# Patient Record
Sex: Female | Born: 1935 | Race: White | Hispanic: No | Smoking: Never smoker
Health system: Southern US, Community
[De-identification: ages and names within clinical notes are randomized; demographics above are authoritative.]

## PROBLEM LIST (undated history)

## (undated) DIAGNOSIS — R519 Headache, unspecified: Secondary | ICD-10-CM

## (undated) DIAGNOSIS — J189 Pneumonia, unspecified organism: Secondary | ICD-10-CM

## (undated) DIAGNOSIS — I10 Essential (primary) hypertension: Secondary | ICD-10-CM

## (undated) DIAGNOSIS — M1990 Unspecified osteoarthritis, unspecified site: Secondary | ICD-10-CM

## (undated) DIAGNOSIS — F0390 Unspecified dementia without behavioral disturbance: Secondary | ICD-10-CM

---

## 2019-07-03 ENCOUNTER — Inpatient Hospital Stay (HOSPITAL_COMMUNITY)
Admission: AD | Admit: 2019-07-03 | Discharge: 2019-07-17 | DRG: 522 | Disposition: A | Discharge status: Other Acute Inpatient Hospital | Payer: Medicare Other | Source: Other Acute Inpatient Hospital | Attending: Internal Medicine | Admitting: Internal Medicine

## 2019-07-03 ENCOUNTER — Other Ambulatory Visit: Payer: Self-pay

## 2019-07-03 DIAGNOSIS — Z882 Allergy status to sulfonamides status: Secondary | ICD-10-CM | POA: Diagnosis not present

## 2019-07-03 DIAGNOSIS — S72012A Unspecified intracapsular fracture of left femur, initial encounter for closed fracture: Secondary | ICD-10-CM | POA: Diagnosis present

## 2019-07-03 DIAGNOSIS — D649 Anemia, unspecified: Secondary | ICD-10-CM | POA: Diagnosis present

## 2019-07-03 DIAGNOSIS — Z79899 Other long term (current) drug therapy: Secondary | ICD-10-CM | POA: Diagnosis not present

## 2019-07-03 DIAGNOSIS — D62 Acute posthemorrhagic anemia: Secondary | ICD-10-CM | POA: Diagnosis not present

## 2019-07-03 DIAGNOSIS — L89152 Pressure ulcer of sacral region, stage 2: Secondary | ICD-10-CM | POA: Diagnosis present

## 2019-07-03 DIAGNOSIS — M1990 Unspecified osteoarthritis, unspecified site: Secondary | ICD-10-CM | POA: Diagnosis present

## 2019-07-03 DIAGNOSIS — Z20822 Contact with and (suspected) exposure to covid-19: Secondary | ICD-10-CM | POA: Diagnosis present

## 2019-07-03 DIAGNOSIS — D72829 Elevated white blood cell count, unspecified: Secondary | ICD-10-CM | POA: Diagnosis present

## 2019-07-03 DIAGNOSIS — Z880 Allergy status to penicillin: Secondary | ICD-10-CM | POA: Diagnosis not present

## 2019-07-03 DIAGNOSIS — Z96642 Presence of left artificial hip joint: Secondary | ICD-10-CM

## 2019-07-03 DIAGNOSIS — I10 Essential (primary) hypertension: Secondary | ICD-10-CM | POA: Diagnosis present

## 2019-07-03 DIAGNOSIS — E876 Hypokalemia: Secondary | ICD-10-CM | POA: Diagnosis present

## 2019-07-03 DIAGNOSIS — Z833 Family history of diabetes mellitus: Secondary | ICD-10-CM | POA: Diagnosis not present

## 2019-07-03 DIAGNOSIS — Z885 Allergy status to narcotic agent status: Secondary | ICD-10-CM

## 2019-07-03 DIAGNOSIS — S72002A Fracture of unspecified part of neck of left femur, initial encounter for closed fracture: Secondary | ICD-10-CM | POA: Diagnosis present

## 2019-07-03 DIAGNOSIS — S72009A Fracture of unspecified part of neck of unspecified femur, initial encounter for closed fracture: Secondary | ICD-10-CM | POA: Diagnosis present

## 2019-07-03 DIAGNOSIS — L8990 Pressure ulcer of unspecified site, unspecified stage: Secondary | ICD-10-CM | POA: Insufficient documentation

## 2019-07-03 DIAGNOSIS — W010XXA Fall on same level from slipping, tripping and stumbling without subsequent striking against object, initial encounter: Secondary | ICD-10-CM | POA: Diagnosis present

## 2019-07-03 DIAGNOSIS — S72002S Fracture of unspecified part of neck of left femur, sequela: Secondary | ICD-10-CM | POA: Insufficient documentation

## 2019-07-03 HISTORY — DX: Essential (primary) hypertension: I10

## 2019-07-03 MED ORDER — HYDROCODONE-ACETAMINOPHEN 5-325 MG PO TABS
1.0000 | ORAL_TABLET | Freq: Four times a day (QID) | ORAL | Status: DC | PRN
  Administered 2019-07-04: 1 via ORAL
  Administered 2019-07-04 (×2): 2 via ORAL
  Administered 2019-07-05: 1 via ORAL
  Administered 2019-07-05: 2 via ORAL
  Administered 2019-07-06: 1 via ORAL
  Administered 2019-07-06 – 2019-07-12 (×16): 2 via ORAL
  Administered 2019-07-12: 1 via ORAL
  Administered 2019-07-12: 2 via ORAL
  Administered 2019-07-13 (×2): 1 via ORAL
  Administered 2019-07-13: 2 via ORAL
  Administered 2019-07-14 (×2): 1 via ORAL
  Administered 2019-07-14: 2 via ORAL
  Administered 2019-07-15 – 2019-07-16 (×5): 1 via ORAL
  Administered 2019-07-16: 2 via ORAL
  Administered 2019-07-17: 1 via ORAL
  Filled 2019-07-03: qty 2
  Filled 2019-07-03 (×3): qty 1
  Filled 2019-07-03 (×2): qty 2
  Filled 2019-07-03 (×4): qty 1
  Filled 2019-07-03 (×7): qty 2
  Filled 2019-07-03 (×2): qty 1
  Filled 2019-07-03: qty 2
  Filled 2019-07-03: qty 1
  Filled 2019-07-03: qty 2
  Filled 2019-07-03: qty 1
  Filled 2019-07-03: qty 2
  Filled 2019-07-03 (×2): qty 1
  Filled 2019-07-03 (×3): qty 2
  Filled 2019-07-03 (×2): qty 1
  Filled 2019-07-03 (×6): qty 2
  Filled 2019-07-03: qty 1

## 2019-07-03 MED ORDER — MORPHINE SULFATE (PF) 2 MG/ML IV SOLN
0.5000 mg | INTRAVENOUS | Status: DC | PRN
  Administered 2019-07-04 – 2019-07-07 (×7): 0.5 mg via INTRAVENOUS
  Filled 2019-07-03 (×7): qty 1

## 2019-07-03 NOTE — Progress Notes (Signed)
Pt arrived to room @ 2100. Skin assessment completed w/ Vee RN.   MD and admissions paged regarding pt arrival.   CHG bath completed

## 2019-07-04 ENCOUNTER — Encounter (HOSPITAL_COMMUNITY): Payer: Self-pay | Admitting: Internal Medicine

## 2019-07-04 ENCOUNTER — Inpatient Hospital Stay (HOSPITAL_COMMUNITY): Payer: Medicare Other

## 2019-07-04 DIAGNOSIS — S72002A Fracture of unspecified part of neck of left femur, initial encounter for closed fracture: Secondary | ICD-10-CM

## 2019-07-04 DIAGNOSIS — L8990 Pressure ulcer of unspecified site, unspecified stage: Secondary | ICD-10-CM | POA: Insufficient documentation

## 2019-07-04 DIAGNOSIS — I10 Essential (primary) hypertension: Secondary | ICD-10-CM | POA: Diagnosis present

## 2019-07-04 DIAGNOSIS — D649 Anemia, unspecified: Secondary | ICD-10-CM | POA: Diagnosis present

## 2019-07-04 LAB — COMPREHENSIVE METABOLIC PANEL
ALT: 18 U/L (ref 0–44)
AST: 23 U/L (ref 15–41)
Albumin: 2.8 g/dL — ABNORMAL LOW (ref 3.5–5.0)
Alkaline Phosphatase: 85 U/L (ref 38–126)
Anion gap: 10 (ref 5–15)
BUN: 6 mg/dL — ABNORMAL LOW (ref 8–23)
CO2: 27 mmol/L (ref 22–32)
Calcium: 8.6 mg/dL — ABNORMAL LOW (ref 8.9–10.3)
Chloride: 100 mmol/L (ref 98–111)
Creatinine, Ser: 0.61 mg/dL (ref 0.44–1.00)
GFR calc Af Amer: >60 mL/min (ref >60)
GFR calc non Af Amer: >60 mL/min (ref >60)
Glucose, Bld: 116 mg/dL — ABNORMAL HIGH (ref 70–99)
Potassium: 3.2 mmol/L — ABNORMAL LOW (ref 3.5–5.1)
Sodium: 137 mmol/L (ref 135–145)
Total Bilirubin: 0.5 mg/dL (ref 0.3–1.2)
Total Protein: 7.1 g/dL (ref 6.5–8.1)

## 2019-07-04 LAB — CBC WITH DIFFERENTIAL/PLATELET
Abs Immature Granulocytes: 0.06 10*3/uL (ref 0.00–0.07)
Basophils Absolute: 0.1 10*3/uL (ref 0.0–0.1)
Basophils Relative: 0 %
Eosinophils Absolute: 0 10*3/uL (ref 0.0–0.5)
Eosinophils Relative: 0 %
HCT: 32 % — ABNORMAL LOW (ref 36.0–46.0)
Hemoglobin: 9.7 g/dL — ABNORMAL LOW (ref 12.0–15.0)
Immature Granulocytes: 1 %
Lymphocytes Relative: 19 %
Lymphs Abs: 2.5 10*3/uL (ref 0.7–4.0)
MCH: 26.4 pg (ref 26.0–34.0)
MCHC: 30.3 g/dL (ref 30.0–36.0)
MCV: 87.2 fL (ref 80.0–100.0)
Monocytes Absolute: 1.4 10*3/uL — ABNORMAL HIGH (ref 0.1–1.0)
Monocytes Relative: 10 %
Neutro Abs: 9.2 10*3/uL — ABNORMAL HIGH (ref 1.7–7.7)
Neutrophils Relative %: 70 %
Platelets: 411 10*3/uL — ABNORMAL HIGH (ref 150–400)
RBC: 3.67 MIL/uL — ABNORMAL LOW (ref 3.87–5.11)
RDW: 14.2 % (ref 11.5–15.5)
WBC: 13.2 10*3/uL — ABNORMAL HIGH (ref 4.0–10.5)
nRBC: 0 % (ref 0.0–0.2)

## 2019-07-04 LAB — BASIC METABOLIC PANEL
Anion gap: 8 (ref 5–15)
BUN: 8 mg/dL (ref 8–23)
CO2: 28 mmol/L (ref 22–32)
Calcium: 8.1 mg/dL — ABNORMAL LOW (ref 8.9–10.3)
Chloride: 100 mmol/L (ref 98–111)
Creatinine, Ser: 0.63 mg/dL (ref 0.44–1.00)
GFR calc Af Amer: >60 mL/min (ref >60)
GFR calc non Af Amer: >60 mL/min (ref >60)
Glucose, Bld: 108 mg/dL — ABNORMAL HIGH (ref 70–99)
Potassium: 3.4 mmol/L — ABNORMAL LOW (ref 3.5–5.1)
Sodium: 136 mmol/L (ref 135–145)

## 2019-07-04 LAB — SARS CORONAVIRUS 2 BY RT PCR (HOSPITAL ORDER, PERFORMED IN ~~LOC~~ HOSPITAL LAB): SARS Coronavirus 2: NEGATIVE

## 2019-07-04 LAB — ABO/RH: ABO/RH(D): O POS

## 2019-07-04 LAB — SURGICAL PCR SCREEN
MRSA, PCR: NEGATIVE
Staphylococcus aureus: NEGATIVE

## 2019-07-04 MED ORDER — OXYCODONE-ACETAMINOPHEN 5-325 MG PO TABS
1.0000 | ORAL_TABLET | Freq: Four times a day (QID) | ORAL | Status: DC | PRN
  Administered 2019-07-04: 1 via ORAL
  Filled 2019-07-04: qty 1

## 2019-07-04 MED ORDER — ENSURE PRE-SURGERY PO LIQD
296.0000 mL | Freq: Once | ORAL | Status: AC
  Administered 2019-07-05: 296 mL via ORAL
  Filled 2019-07-04: qty 296

## 2019-07-04 MED ORDER — HYDRALAZINE HCL 20 MG/ML IJ SOLN
10.0000 mg | Freq: Four times a day (QID) | INTRAMUSCULAR | Status: DC | PRN
  Filled 2019-07-04: qty 1

## 2019-07-04 MED ORDER — CHLORHEXIDINE GLUCONATE 4 % EX LIQD
60.0000 mL | Freq: Once | CUTANEOUS | Status: AC
  Administered 2019-07-05: 4 via TOPICAL

## 2019-07-04 MED ORDER — TRANEXAMIC ACID-NACL 1000-0.7 MG/100ML-% IV SOLN: 1000.0000 mg | INTRAVENOUS | Status: DC

## 2019-07-04 MED ORDER — CHLORHEXIDINE GLUCONATE 4 % EX LIQD
60.0000 mL | Freq: Once | CUTANEOUS | Status: AC
  Filled 2019-07-04: qty 60

## 2019-07-04 MED ORDER — CEFAZOLIN SODIUM-DEXTROSE 2-4 GM/100ML-% IV SOLN
2.0000 g | INTRAVENOUS | Status: DC
  Filled 2019-07-04: qty 100

## 2019-07-04 MED ORDER — POTASSIUM CHLORIDE CRYS ER 20 MEQ PO TBCR
20.0000 meq | EXTENDED_RELEASE_TABLET | Freq: Once | ORAL | Status: AC
  Administered 2019-07-04: 20 meq via ORAL
  Filled 2019-07-04: qty 1

## 2019-07-04 MED ORDER — ALPRAZOLAM 0.5 MG PO TABS
0.5000 mg | ORAL_TABLET | Freq: Every day | ORAL | Status: DC | PRN
  Administered 2019-07-04 – 2019-07-16 (×13): 0.5 mg via ORAL
  Filled 2019-07-04 (×14): qty 1

## 2019-07-04 MED ORDER — OXYCODONE-ACETAMINOPHEN 10-325 MG PO TABS: 1.0000 | ORAL_TABLET | Freq: Four times a day (QID) | ORAL | Status: DC | PRN

## 2019-07-04 MED ORDER — POVIDONE-IODINE 10 % EX SWAB: 2.0000 "application " | Freq: Once | CUTANEOUS | Status: DC

## 2019-07-04 MED ORDER — OXYCODONE HCL 5 MG PO TABS
5.0000 mg | ORAL_TABLET | Freq: Four times a day (QID) | ORAL | Status: DC | PRN
  Administered 2019-07-05: 5 mg via ORAL
  Filled 2019-07-04: qty 1

## 2019-07-04 MED ORDER — CHLORHEXIDINE GLUCONATE CLOTH 2 % EX PADS: 6.0000 | MEDICATED_PAD | Freq: Every day | CUTANEOUS | Status: DC

## 2019-07-04 MED ORDER — PANTOPRAZOLE SODIUM 40 MG PO TBEC
40.0000 mg | DELAYED_RELEASE_TABLET | Freq: Every day | ORAL | Status: DC
  Administered 2019-07-04 – 2019-07-17 (×14): 40 mg via ORAL
  Filled 2019-07-04 (×14): qty 1

## 2019-07-04 MED ORDER — ENSURE ENLIVE PO LIQD
237.0000 mL | ORAL | Status: DC
  Administered 2019-07-04 – 2019-07-17 (×15): 237 mL via ORAL

## 2019-07-04 MED ORDER — DEXTROSE 5 % IV SOLN: 3.0000 g | INTRAVENOUS | Status: DC

## 2019-07-04 NOTE — Progress Notes (Signed)
PROGRESS NOTE    Sabrina Glass  RKY:[id number] DOB: April 30, 1927 DOA: 07/03/2019 PCP: Patient, No Pcp Per   Brief Narrative:   Sabrina Glass is a 86 y.o. female with history of hypertension was brought to the ER at [facility] after patient had a fall.  Patient states she slipped and fell when her leg gave way.  Did not hit her head or lose consciousness.  Denies any chest pain or shortness of breath.  She hurt on the left of the hip and x-rays in the ER showed left hip fracture and patient was transferred to [facility] for further management.  Labs over that showed anemia with hemoglobin around 9 mild leukocytosis chest x-ray showing chronic findings.  On my exam patient is not in distress.  Patient is alert awake and oriented to time place and person moving all extremities.  Assessment & Plan:   Principal Problem:   Closed left hip fracture, initial encounter [facility]) Active Problems:   Hip fracture ([place])   Essential hypertension   Anemia   Pressure injury of skin  1. Left hip fracture status post mechanical fall: Patient still in pain.  Orthopedics on board.  Plan for surgery tomorrow.  Appreciate Ortho help. 2. Essential hypertension: Interestingly, her home medications have not been reconciled by pharmacy yet.  Her blood pressure slightly elevated.  She is on IV hydralazine for now.  Will resume her home medications once medication are reconciled.  Conveyed message to the nurse to reach out to pharmacy. 3. Anemia hemoglobin appears to be at the same level when compared to the one done in [place].  We do not have old labs to compare.  Awaiting morning labs today. 4. Leukocytosis could be reactionary patient is afebrile.  Closely monitor. 5. Mild hypokalemia -was replaced yesterday.  Waiting for morning labs.    DVT prophylaxis: SCDs Start: 07/03/19 2251   Code Status: Full Code  Family Communication: None present at bedside.  Plan of care discussed with  patient in length and he verbalized understanding and agreed with it.  Status is: Inpatient  Remains inpatient appropriate because:Inpatient level of care appropriate due to severity of illness   Dispo: The patient is from: Home              Anticipated d/c is to: SNF              Anticipated d/c date is: 3 days              Patient currently is not medically stable to d/c.        There is no height or weight on file to calculate BMI.  Pressure Injury 07/03/19 Coccyx Medial;Lower Stage 2 -  Partial thickness loss of dermis presenting as a shallow open injury with a red, pink wound bed without slough. (Active)  07/03/19 2154  Location: Coccyx  Location Orientation: Medial;Lower  Staging: Stage 2 -  Partial thickness loss of dermis presenting as a shallow open injury with a red, pink wound bed without slough.  Wound Description (Comments):   Present on Admission: Yes     Nutritional status:               Consultants:   Orthopedics  Procedures:   None  Antimicrobials:  Anti-infectives (From admission, onward)   Start     Dose/Rate Route Frequency Ordered Stop   07/04/19 0730  ceFAZolin (ANCEF) IVPB 2g/100 mL premix     Discontinue  2 g 200 mL/hr over 30 Minutes Intravenous On call to O.R. 07/04/19 0720 07/05/19 0559   07/04/19 0730  ceFAZolin (ANCEF) 3 g in dextrose 5 % 50 mL IVPB  Status:  Discontinued        3 g 100 mL/hr over 30 Minutes Intravenous On call to O.R. 07/04/19 0720 07/04/19 0723         Subjective: Seen and examined.  Complains of left hip pain.  No other complaint.  Objective: Vitals:   07/03/19 2113 07/03/19 2359 07/04/19 0427 07/04/19 0758  BP: (!) 159/69 (!) 152/71 (!) 142/59 (!) 170/81  Pulse: 68 74 74 78  Resp: 14 14 14 16   Temp: 98.9 F (37.2 C) 98.5 F (36.9 C) 98.4 F (36.9 C) 98.6 F (37 C)  TempSrc: Oral Oral Oral Oral  SpO2: 100% 95% 94% 96%    Intake/Output Summary (Last 24 hours) at 07/04/2019 1027 Last  data filed at 07/04/2019 0445 Gross per 24 hour  Intake --  Output 600 ml  Net -600 ml   There were no vitals filed for this visit.  Examination:  General exam: Appears calm and comfortable  Respiratory system: Clear to auscultation. Respiratory effort normal. Cardiovascular system: S1 & S2 heard, RRR. No JVD, murmurs, rubs, gallops or clicks. No pedal edema. Gastrointestinal system: Abdomen is nondistended, soft and nontender. No organomegaly or masses felt. Normal bowel sounds heard. Central nervous system: Alert and oriented. No focal neurological deficits. Extremities: Symmetric 5 x 5 power in all extremities except left lower extremity.  Left lower extremity externally rotated and shortened. Skin: No rashes, lesions or ulcers Psychiatry: Judgement and insight appear normal. Mood & affect appropriate.    Data Reviewed: I have personally reviewed following labs and imaging studies  CBC: Recent Labs  Lab 07/04/19 0054  WBC 13.2*  NEUTROABS 9.2*  HGB 9.7*  HCT 32.0*  MCV 87.2  PLT 411*   Basic Metabolic Panel: Recent Labs  Lab 07/04/19 0054  NA 137  K 3.2*  CL 100  CO2 27  GLUCOSE 116*  BUN 6*  CREATININE 0.61  CALCIUM 8.6*   GFR: CrCl cannot be calculated (Unknown ideal weight.). Liver Function Tests: Recent Labs  Lab 07/04/19 0054  AST 23  ALT 18  ALKPHOS 85  BILITOT 0.5  PROT 7.1  ALBUMIN 2.8*   No results for input(s): LIPASE, AMYLASE in the last 168 hours. No results for input(s): AMMONIA in the last 168 hours. Coagulation Profile: No results for input(s): INR, PROTIME in the last 168 hours. Cardiac Enzymes: No results for input(s): CKTOTAL, CKMB, CKMBINDEX, TROPONINI in the last 168 hours. BNP (last 3 results) No results for input(s): PROBNP in the last 8760 hours. HbA1C: No results for input(s): HGBA1C in the last 72 hours. CBG: No results for input(s): GLUCAP in the last 168 hours. Lipid Profile: No results for input(s): CHOL, HDL,  LDLCALC, TRIG, CHOLHDL, LDLDIRECT in the last 72 hours. Thyroid Function Tests: No results for input(s): TSH, T4TOTAL, FREET4, T3FREE, THYROIDAB in the last 72 hours. Anemia Panel: No results for input(s): VITAMINB12, FOLATE, FERRITIN, TIBC, IRON, RETICCTPCT in the last 72 hours. Sepsis Labs: No results for input(s): PROCALCITON, LATICACIDVEN in the last 168 hours.  Recent Results (from the past 240 hour(s))  SARS Coronavirus 2 by RT PCR (hospital order, performed in [facility] hospital lab) Nasopharyngeal Nasopharyngeal Swab     Status: None   Collection Time: 07/04/19  5:03 AM   Specimen: Nasopharyngeal Swab  Result Value  Ref Range Status   SARS Coronavirus 2 NEGATIVE NEGATIVE Final    Comment: (NOTE) SARS-CoV-2 target nucleic acids are NOT DETECTED.  The SARS-CoV-2 RNA is generally detectable in upper and lower respiratory specimens during the acute phase of infection. The lowest concentration of SARS-CoV-2 viral copies this assay can detect is 250 copies / mL. A negative result does not preclude SARS-CoV-2 infection and should not be used as the sole basis for treatment or other patient management decisions.  A negative result may occur with improper specimen collection / handling, submission of specimen other than nasopharyngeal swab, presence of viral mutation(s) within the areas targeted by this assay, and inadequate number of viral copies (<250 copies / mL). A negative result must be combined with clinical observations, patient history, and epidemiological information.  Fact Sheet for Patients:   BoilerBrush.com.cy  Fact Sheet for Healthcare Providers: https://pope.com/  This test is not yet approved or  cleared by the [place] FDA and has been authorized for detection and/or diagnosis of SARS-CoV-2 by FDA under an Emergency Use Authorization (EUA).  This EUA will remain in effect (meaning this test can be used) for  the duration of the COVID-19 declaration under Section 564(b)(1) of the Act, 21 U.S.C. section 360bbb-3(b)(1), unless the authorization is terminated or revoked sooner.  Performed at [facility], [address] [place], [place] [address]       Radiology Studies: DG Knee Left Port  Result Date: 07/04/2019 CLINICAL DATA:  Hip fracture.  Left knee pain. EXAM: PORTABLE LEFT KNEE - 1-2 VIEW COMPARISON:  None. FINDINGS: No evidence of fracture, dislocation, or joint effusion. No evidence of arthropathy or other focal bone abnormality. Soft tissues are unremarkable. IMPRESSION: Negative. Electronically Signed   By: Deatra Robinson M.D.   On: 07/04/2019 06:38   DG HIP UNILAT WITH PELVIS 2-3 VIEWS LEFT  Result Date: 07/04/2019 CLINICAL DATA:  Hip fracture EXAM: DG HIP (WITH OR WITHOUT PELVIS) 2-3V LEFT COMPARISON:  None. FINDINGS: There is a mildly displaced medially angulated fracture of the left femoral neck. No dislocation. No other pelvic fracture. There is moderate left hip osteoarthrosis. IMPRESSION: Mildly displaced, medially angulated fracture of the left femoral neck. Electronically Signed   By: Deatra Robinson M.D.   On: 07/04/2019 06:35    Scheduled Meds: . chlorhexidine  60 mL Topical Once  . chlorhexidine  60 mL Topical Once  . feeding supplement  296 mL Oral Once  . povidone-iodine  2 application Topical Once  . povidone-iodine  2 application Topical Once   Continuous Infusions: .  ceFAZolin (ANCEF) IV    . tranexamic acid       LOS: 1 day   Time spent: 33 minutes   Hughie Closs, MD [facility]  07/04/2019, 10:27 AM   To contact the attending provider between 7A-7P or the covering provider during after hours 7P-7A, please log into the web site [email or web address].

## 2019-07-04 NOTE — Progress Notes (Signed)
Pt set off bed alarm. Writer walked into room to patient sitting at bedside attempting to get out of bed. Pt confused to place and time stating "I need to get my grandkids ready for school"  Pt removed gown and telemetry. Pt reorientated and helped back into bed. Pt needing continuous reorientation.

## 2019-07-04 NOTE — H&P (Addendum)
History and Physical    Sabrina Glass NUU:[id number] DOB: December 09, 1927 DOA: 07/03/2019  PCP: Patient, No Pcp Per  Patient coming from: Patient was transferred from [facility].  Chief Complaint: Fall and left hip pain.  HPI: Sabrina Glass is a 86 y.o. female with history of hypertension was brought to the ER at [facility] after patient had a fall.  Patient states she slipped and fell when her leg gave way.  Did not hit her head or lose consciousness.  Denies any chest pain or shortness of breath.  She hurt on the left of the hip and x-rays in the ER showed left hip fracture and patient was transferred to [facility] for further management.  Labs over that showed anemia with hemoglobin around 9 mild leukocytosis chest x-ray showing chronic findings.  On my exam patient is not in distress.  Patient is alert awake and oriented to time place and person moving all extremities.  ED Course: Patient is a direct admit.  Review of Systems: As per HPI, rest all negative.   Past Medical History:  Diagnosis Date  . Hypertension     History reviewed. No pertinent surgical history.   reports that she has never smoked. She has never used smokeless tobacco. She reports that she does not drink alcohol. No history on file for drug use.  Not on File  Family History  Problem Relation Age of Onset  . Diabetes Mellitus II Father     Prior to Admission medications   Not on File    Physical Exam: Constitutional: Moderately built and nourished. Vitals:   07/03/19 2113 07/03/19 2359  BP: (!) 159/69 (!) 152/71  Pulse: 68 74  Resp: 14 14  Temp: 98.9 F (37.2 C) 98.5 F (36.9 C)  TempSrc: Oral Oral  SpO2: 100% 95%   Eyes: Anicteric no pallor. ENMT: No discharge from the ears eyes nose or mouth. Neck: No masses.  No neck rigidity. Respiratory: No rhonchi or crepitations. Cardiovascular: S1-S2 heard. Abdomen: Soft nontender bowel sounds present. Musculoskeletal: Pain  on moving left hip. Skin: No rash. Neurologic: Alert awake oriented time place and person.  Moves all extremities. Psychiatric: Appears normal.   Labs on Admission: I have personally reviewed following labs and imaging studies  CBC: Recent Labs  Lab 07/04/19 0054  WBC 13.2*  NEUTROABS 9.2*  HGB 9.7*  HCT 32.0*  MCV 87.2  PLT 411*   Basic Metabolic Panel: Recent Labs  Lab 07/04/19 0054  NA 137  K 3.2*  CL 100  CO2 27  GLUCOSE 116*  BUN 6*  CREATININE 0.61  CALCIUM 8.6*   GFR: CrCl cannot be calculated (Unknown ideal weight.). Liver Function Tests: Recent Labs  Lab 07/04/19 0054  AST 23  ALT 18  ALKPHOS 85  BILITOT 0.5  PROT 7.1  ALBUMIN 2.8*   No results for input(s): LIPASE, AMYLASE in the last 168 hours. No results for input(s): AMMONIA in the last 168 hours. Coagulation Profile: No results for input(s): INR, PROTIME in the last 168 hours. Cardiac Enzymes: No results for input(s): CKTOTAL, CKMB, CKMBINDEX, TROPONINI in the last 168 hours. BNP (last 3 results) No results for input(s): PROBNP in the last 8760 hours. HbA1C: No results for input(s): HGBA1C in the last 72 hours. CBG: No results for input(s): GLUCAP in the last 168 hours. Lipid Profile: No results for input(s): CHOL, HDL, LDLCALC, TRIG, CHOLHDL, LDLDIRECT in the last 72 hours. Thyroid Function Tests: No results for input(s):  TSH, T4TOTAL, FREET4, T3FREE, THYROIDAB in the last 72 hours. Anemia Panel: No results for input(s): VITAMINB12, FOLATE, FERRITIN, TIBC, IRON, RETICCTPCT in the last 72 hours. Urine analysis: No results found for: COLORURINE, APPEARANCEUR, LABSPEC, PHURINE, GLUCOSEU, HGBUR, BILIRUBINUR, KETONESUR, PROTEINUR, UROBILINOGEN, NITRITE, LEUKOCYTESUR Sepsis Labs: @LABRCNTIP (procalcitonin:4,lacticidven:4) )No results found for this or any previous visit (from the past 240 hour(s)).   Radiological Exams on Admission: No results found.    Assessment/Plan Principal  Problem:   Closed left hip fracture, initial encounter [facility]) Active Problems:   Hip fracture (HCC)   Essential hypertension   Anemia    1. Left hip fracture status post mechanical fall for which we will keep patient n.p.o. in the morning except medications and consult orthopedics.  Pain relief medications.  Addendum -discussed with Dr. Doreatha Martin orthopedics will be seeing patient. 2. Hypertension we will keep patient on as needed IV hydralazine since patient is going to be n.p.o. 3. Anemia hemoglobin appears to be at the same level when compared to the one done in [place].  We do not have old labs to compare. 4. Leukocytosis could be reactionary patient is afebrile.  Closely monitor. 5. Mild hypokalemia -replace and recheck.  We will need to get further history when patient's daughter is contacted.  And also verify home medications.  Covid test is pending.  Since patient has hip fracture will need more than 2 midnight stay in inpatient status.   DVT prophylaxis: SCDs for now in anticipation of surgery we are holding off pharmacological DVT prophylaxis. Code Status: Full code as confirmed with patient. Family Communication: We will need to reach patient's daughter.  Patient lives with them. Disposition Plan: May need rehab. Consults called: We will consult orthopedics. Admission status: Inpatient.   Rise Patience MD [facility] Pager [phone].  If 7PM-7AM, please contact night-coverage [email or web address] Password [facility]  07/04/2019, 3:49 AM

## 2019-07-04 NOTE — H&P (View-Only) (Signed)
Reason for Consult:L hip fx Referring Physician: [name] is an 86 y.o. female.  HPI: [place] on L hip, unable to weightbear. Reports hx of arthritis. Denies other injuries from her fall. We are consulted for definitive tx of her hip fx   Past Medical History:  Diagnosis Date  . Hypertension     History reviewed. No pertinent surgical history.  Family History  Problem Relation Age of Onset  . Diabetes Mellitus II Father     Social History:  reports that she has never smoked. She has never used smokeless tobacco. She reports that she does not drink alcohol. No history on file for drug use.  Allergies: Not on File  Medications: I have reviewed the patient's current medications.  Results for orders placed or performed during the hospital encounter of 07/03/19 (from the past 48 hour(s))  CBC WITH DIFFERENTIAL     Status: Abnormal   Collection Time: 07/04/19 12:54 AM  Result Value Ref Range   WBC 13.2 (H) 4.0 - 10.5 K/uL   RBC 3.67 (L) 3.87 - 5.11 MIL/uL   Hemoglobin 9.7 (L) 12.0 - 15.0 g/dL   HCT 32.0 (L) 36 - 46 %   MCV 87.2 80.0 - 100.0 fL   MCH 26.4 26.0 - 34.0 pg   MCHC 30.3 30.0 - 36.0 g/dL   RDW 14.2 11.5 - 15.5 %   Platelets 411 (H) 150 - 400 K/uL   nRBC 0.0 0.0 - 0.2 %   Neutrophils Relative % 70 %   Neutro Abs 9.2 (H) 1.7 - 7.7 K/uL   Lymphocytes Relative 19 %   Lymphs Abs 2.5 0.7 - 4.0 K/uL   Monocytes Relative 10 %   Monocytes Absolute 1.4 (H) 0 - 1 K/uL   Eosinophils Relative 0 %   Eosinophils Absolute 0.0 0 - 0 K/uL   Basophils Relative 0 %   Basophils Absolute 0.1 0 - 0 K/uL   Immature Granulocytes 1 %   Abs Immature Granulocytes 0.06 0.00 - 0.07 K/uL    Comment: Performed at [facility], [address] [place], [place] [address]  Comprehensive metabolic panel     Status: Abnormal   Collection Time: 07/04/19 12:54 AM  Result Value Ref Range   Sodium 137 135 - 145 mmol/L   Potassium 3.2 (L) 3.5 - 5.1 mmol/L   Chloride 100 98 -  111 mmol/L   CO2 27 22 - 32 mmol/L   Glucose, Bld 116 (H) 70 - 99 mg/dL    Comment: Glucose reference range applies only to samples taken after fasting for at least 8 hours.   BUN 6 (L) 8 - 23 mg/dL   Creatinine, Ser 0.61 0.44 - 1.00 mg/dL   Calcium 8.6 (L) 8.9 - 10.3 mg/dL   Total Protein 7.1 6.5 - 8.1 g/dL   Albumin 2.8 (L) 3.5 - 5.0 g/dL   AST 23 15 - 41 U/L   ALT 18 0 - 44 U/L   Alkaline Phosphatase 85 38 - 126 U/L   Total Bilirubin 0.5 0.3 - 1.2 mg/dL   GFR calc non Af Amer >60 >60 mL/min   GFR calc Af Amer >60 >60 mL/min   Anion gap 10 5 - 15    Comment: Performed at [facility], [place] [address], [place], [place] [address]  Type and screen [place]     Status: None   Collection Time: 07/04/19 12:58 AM  Result Value Ref Range  ABO/RH(D) O POS    Antibody Screen NEG    Sample Expiration      07/07/2019,2359 Performed at [facility], [address], [place], [place] [address]   ABO/Rh     Status: None   Collection Time: 07/04/19 12:58 AM  Result Value Ref Range   ABO/RH(D)      O POS Performed at [facility], [address] [place], [place] [address]   SARS Coronavirus 2 by RT PCR (hospital order, performed in [facility] hospital lab) Nasopharyngeal Nasopharyngeal Swab     Status: None   Collection Time: 07/04/19  5:03 AM   Specimen: Nasopharyngeal Swab  Result Value Ref Range   SARS Coronavirus 2 NEGATIVE NEGATIVE    Comment: (NOTE) SARS-CoV-2 target nucleic acids are NOT DETECTED.  The SARS-CoV-2 RNA is generally detectable in upper and lower respiratory specimens during the acute phase of infection. The lowest concentration of SARS-CoV-2 viral copies this assay can detect is 250 copies / mL. A negative result does not preclude SARS-CoV-2 infection and should not be used as the sole basis for treatment or other patient management decisions.  A negative result may occur with improper specimen collection / handling,  submission of specimen other than nasopharyngeal swab, presence of viral mutation(s) within the areas targeted by this assay, and inadequate number of viral copies (<250 copies / mL). A negative result must be combined with clinical observations, patient history, and epidemiological information.  Fact Sheet for Patients:   BoilerBrush.com.cy  Fact Sheet for Healthcare Providers: https://pope.com/  This test is not yet approved or  cleared by the [place] FDA and has been authorized for detection and/or diagnosis of SARS-CoV-2 by FDA under an Emergency Use Authorization (EUA).  This EUA will remain in effect (meaning this test can be used) for the duration of the COVID-19 declaration under Section 564(b)(1) of the Act, 21 U.S.C. section 360bbb-3(b)(1), unless the authorization is terminated or revoked sooner.  Performed at [facility], [address] [place], [place] [address]     DG Knee Left Port  Result Date: 07/04/2019 CLINICAL DATA:  Hip fracture.  Left knee pain. EXAM: PORTABLE LEFT KNEE - 1-2 VIEW COMPARISON:  None. FINDINGS: No evidence of fracture, dislocation, or joint effusion. No evidence of arthropathy or other focal bone abnormality. Soft tissues are unremarkable. IMPRESSION: Negative. Electronically Signed   By: Deatra Robinson M.D.   On: 07/04/2019 06:38   DG HIP UNILAT WITH PELVIS 2-3 VIEWS LEFT  Result Date: 07/04/2019 CLINICAL DATA:  Hip fracture EXAM: DG HIP (WITH OR WITHOUT PELVIS) 2-3V LEFT COMPARISON:  None. FINDINGS: There is a mildly displaced medially angulated fracture of the left femoral neck. No dislocation. No other pelvic fracture. There is moderate left hip osteoarthrosis. IMPRESSION: Mildly displaced, medially angulated fracture of the left femoral neck. Electronically Signed   By: Deatra Robinson M.D.   On: 07/04/2019 06:35    Review of Systems  Constitutional: Negative.   HENT: Negative.    Eyes: Negative.   Respiratory: Negative.   Cardiovascular: Negative.   Gastrointestinal: Negative.   Endocrine: Negative.   Genitourinary: Negative.   Musculoskeletal: Positive for arthralgias.  Neurological: Negative.   Psychiatric/Behavioral: Negative.    Blood pressure (!) 170/81, pulse 78, temperature 98.6 F (37 C), temperature source Oral, resp. rate 16, SpO2 96 %. Physical Exam  HENT:  Head: Normocephalic.  Nose: Nose normal.  Mouth/Throat: Mucous membranes are moist.  Eyes: Pupils are equal,  round, and reactive to light.  Cardiovascular: Normal rate, regular rhythm, normal heart sounds and normal pulses.  Respiratory: Effort normal.  GI: Normal appearance and bowel sounds are normal.  Musculoskeletal:     Cervical back: Normal range of motion.     Comments: L leg shortened and ER Pain L hip/groin with rotation of L leg No calf pain or sign of DVT Sensation intact distally  Neurological: She is alert.  Skin: Skin is warm and dry.    Assessment/Plan: L hip fx  Plan OR tomorrow, keep NPO after MN tonight Discussed surgery   Sabrina Glass 07/04/2019, 8:55 AM     

## 2019-07-04 NOTE — Social Work (Signed)
CSW acknowledging consult for SNF placement. Will follow for therapy recommendations needed to best determine disposition/for insurance authorization.  CSW attempted to contact pt friend Casimiro Needle [phone]), no answer and unable to leave voicemail (number disconnects). Will re-attempt as able.    Octavio Graves, MSW, LCSW [facility] Clinical Social Work

## 2019-07-04 NOTE — Progress Notes (Signed)
Pt to x-ray via transport.

## 2019-07-04 NOTE — Progress Notes (Signed)
Initial Nutrition Assessment  DOCUMENTATION CODES:   Not applicable  INTERVENTION:  Ensure Enlive po daily, each supplement provides 350 kcal and 20 grams of protein  Recommend Juven po BID, with post-op diet advancement to support wound healing  NUTRITION DIAGNOSIS:   Increased nutrient needs related to hip fracture, wound healing (Left hip fracture pending surgical intervention, stage II PI present on admission) as evidenced by estimated needs.   GOAL:   Patient will meet greater than or equal to 90% of their needs    MONITOR:   Labs, Supplement acceptance, PO intake, Weight trends  REASON FOR ASSESSMENT:   Consult Hip fracture protocol  ASSESSMENT:  RD working remotely.  86 year old female with past medical history of HTN and anemia transferred from [facility] in [place] for further management of left hip fracture after a fall at home.  Per chart plans for operative management of fracture, surgery tentatively planned for 6/13. RD attempted to reach patient via phone, however patient did not pick up, unable to obtain nutrition history at this time. Diet has been advanced to Heart Healthy, per medication review, patient has been ordered Ensure pre-surgery once at 0500. Will also provide Ensure Enlive daily to aid with meeting needs. Patient noted with stage II pressure injury present on admission, will monitor for post-op diet advancement and provide Juven to support wound healing.  Current wt 106.7 lb No weight history available for review  Medications reviewed  Labs: K 3.2 (L), Hgb 9.7 (L)  NUTRITION - FOCUSED PHYSICAL EXAM: Unable to complete at this time, RD working remotely.  Diet Order:   Diet Order            Diet NPO time specified Except for: Sips with Meds  Diet effective midnight           Diet Heart Room service appropriate? Yes; Fluid consistency: Thin  Diet effective now                 EDUCATION NEEDS:   No education needs have been  identified at this time  Skin:  Skin Assessment: Skin Integrity Issues: Skin Integrity Issues:: Stage II Stage II: coccyx  Last BM:  6/10  Height:   Ht Readings from Last 1 Encounters:  07/04/19 5\' 1"  (1.549 m)    Weight:   Wt Readings from Last 1 Encounters:  07/04/19 48.5 kg     BMI:  Body mass index is 20.2 kg/m.  Estimated Nutritional Needs:   Kcal:  1500-1700  Protein:  73-85  Fluid:  > 1.2 L   09/03/19, RD, LDN Clinical Nutrition After Hours/Weekend Pager # in Amion

## 2019-07-04 NOTE — Consult Note (Signed)
Reason for Consult:L hip fx Referring Physician: [name] is an 86 y.o. female.  HPI: [place] on L hip, unable to weightbear. Reports hx of arthritis. Denies other injuries from her fall. We are consulted for definitive tx of her hip fx   Past Medical History:  Diagnosis Date  . Hypertension     History reviewed. No pertinent surgical history.  Family History  Problem Relation Age of Onset  . Diabetes Mellitus II Father     Social History:  reports that she has never smoked. She has never used smokeless tobacco. She reports that she does not drink alcohol. No history on file for drug use.  Allergies: Not on File  Medications: I have reviewed the patient's current medications.  Results for orders placed or performed during the hospital encounter of 07/03/19 (from the past 48 hour(s))  CBC WITH DIFFERENTIAL     Status: Abnormal   Collection Time: 07/04/19 12:54 AM  Result Value Ref Range   WBC 13.2 (H) 4.0 - 10.5 K/uL   RBC 3.67 (L) 3.87 - 5.11 MIL/uL   Hemoglobin 9.7 (L) 12.0 - 15.0 g/dL   HCT 32.0 (L) 36 - 46 %   MCV 87.2 80.0 - 100.0 fL   MCH 26.4 26.0 - 34.0 pg   MCHC 30.3 30.0 - 36.0 g/dL   RDW 14.2 11.5 - 15.5 %   Platelets 411 (H) 150 - 400 K/uL   nRBC 0.0 0.0 - 0.2 %   Neutrophils Relative % 70 %   Neutro Abs 9.2 (H) 1.7 - 7.7 K/uL   Lymphocytes Relative 19 %   Lymphs Abs 2.5 0.7 - 4.0 K/uL   Monocytes Relative 10 %   Monocytes Absolute 1.4 (H) 0 - 1 K/uL   Eosinophils Relative 0 %   Eosinophils Absolute 0.0 0 - 0 K/uL   Basophils Relative 0 %   Basophils Absolute 0.1 0 - 0 K/uL   Immature Granulocytes 1 %   Abs Immature Granulocytes 0.06 0.00 - 0.07 K/uL    Comment: Performed at [facility], [address] [place], [place] [address]  Comprehensive metabolic panel     Status: Abnormal   Collection Time: 07/04/19 12:54 AM  Result Value Ref Range   Sodium 137 135 - 145 mmol/L   Potassium 3.2 (L) 3.5 - 5.1 mmol/L   Chloride 100 98 -  111 mmol/L   CO2 27 22 - 32 mmol/L   Glucose, Bld 116 (H) 70 - 99 mg/dL    Comment: Glucose reference range applies only to samples taken after fasting for at least 8 hours.   BUN 6 (L) 8 - 23 mg/dL   Creatinine, Ser 0.61 0.44 - 1.00 mg/dL   Calcium 8.6 (L) 8.9 - 10.3 mg/dL   Total Protein 7.1 6.5 - 8.1 g/dL   Albumin 2.8 (L) 3.5 - 5.0 g/dL   AST 23 15 - 41 U/L   ALT 18 0 - 44 U/L   Alkaline Phosphatase 85 38 - 126 U/L   Total Bilirubin 0.5 0.3 - 1.2 mg/dL   GFR calc non Af Amer >60 >60 mL/min   GFR calc Af Amer >60 >60 mL/min   Anion gap 10 5 - 15    Comment: Performed at [facility], [place] [address], [place], [place] [address]  Type and screen [place]     Status: None   Collection Time: 07/04/19 12:58 AM  Result Value Ref Range  ABO/RH(D) O POS    Antibody Screen NEG    Sample Expiration      07/07/2019,2359 Performed at [facility], [address], [place], [place] [address]   ABO/Rh     Status: None   Collection Time: 07/04/19 12:58 AM  Result Value Ref Range   ABO/RH(D)      O POS Performed at [facility], [address] [place], [place] [address]   SARS Coronavirus 2 by RT PCR (hospital order, performed in [facility] hospital lab) Nasopharyngeal Nasopharyngeal Swab     Status: None   Collection Time: 07/04/19  5:03 AM   Specimen: Nasopharyngeal Swab  Result Value Ref Range   SARS Coronavirus 2 NEGATIVE NEGATIVE    Comment: (NOTE) SARS-CoV-2 target nucleic acids are NOT DETECTED.  The SARS-CoV-2 RNA is generally detectable in upper and lower respiratory specimens during the acute phase of infection. The lowest concentration of SARS-CoV-2 viral copies this assay can detect is 250 copies / mL. A negative result does not preclude SARS-CoV-2 infection and should not be used as the sole basis for treatment or other patient management decisions.  A negative result may occur with improper specimen collection / handling,  submission of specimen other than nasopharyngeal swab, presence of viral mutation(s) within the areas targeted by this assay, and inadequate number of viral copies (<250 copies / mL). A negative result must be combined with clinical observations, patient history, and epidemiological information.  Fact Sheet for Patients:   BoilerBrush.com.cy  Fact Sheet for Healthcare Providers: https://pope.com/  This test is not yet approved or  cleared by the [place] FDA and has been authorized for detection and/or diagnosis of SARS-CoV-2 by FDA under an Emergency Use Authorization (EUA).  This EUA will remain in effect (meaning this test can be used) for the duration of the COVID-19 declaration under Section 564(b)(1) of the Act, 21 U.S.C. section 360bbb-3(b)(1), unless the authorization is terminated or revoked sooner.  Performed at [facility], [address] [place], [place] [address]     DG Knee Left Port  Result Date: 07/04/2019 CLINICAL DATA:  Hip fracture.  Left knee pain. EXAM: PORTABLE LEFT KNEE - 1-2 VIEW COMPARISON:  None. FINDINGS: No evidence of fracture, dislocation, or joint effusion. No evidence of arthropathy or other focal bone abnormality. Soft tissues are unremarkable. IMPRESSION: Negative. Electronically Signed   By: Deatra Robinson M.D.   On: 07/04/2019 06:38   DG HIP UNILAT WITH PELVIS 2-3 VIEWS LEFT  Result Date: 07/04/2019 CLINICAL DATA:  Hip fracture EXAM: DG HIP (WITH OR WITHOUT PELVIS) 2-3V LEFT COMPARISON:  None. FINDINGS: There is a mildly displaced medially angulated fracture of the left femoral neck. No dislocation. No other pelvic fracture. There is moderate left hip osteoarthrosis. IMPRESSION: Mildly displaced, medially angulated fracture of the left femoral neck. Electronically Signed   By: Deatra Robinson M.D.   On: 07/04/2019 06:35    Review of Systems  Constitutional: Negative.   HENT: Negative.    Eyes: Negative.   Respiratory: Negative.   Cardiovascular: Negative.   Gastrointestinal: Negative.   Endocrine: Negative.   Genitourinary: Negative.   Musculoskeletal: Positive for arthralgias.  Neurological: Negative.   Psychiatric/Behavioral: Negative.    Blood pressure (!) 170/81, pulse 78, temperature 98.6 F (37 C), temperature source Oral, resp. rate 16, SpO2 96 %. Physical Exam  HENT:  Head: Normocephalic.  Nose: Nose normal.  Mouth/Throat: Mucous membranes are moist.  Eyes: Pupils are equal,  round, and reactive to light.  Cardiovascular: Normal rate, regular rhythm, normal heart sounds and normal pulses.  Respiratory: Effort normal.  GI: Normal appearance and bowel sounds are normal.  Musculoskeletal:     Cervical back: Normal range of motion.     Comments: L leg shortened and ER Pain L hip/groin with rotation of L leg No calf pain or sign of DVT Sensation intact distally  Neurological: She is alert.  Skin: Skin is warm and dry.    Assessment/Plan: L hip fx  Plan OR tomorrow, keep NPO after MN tonight Discussed surgery   Dorothy Spark 07/04/2019, 8:55 AM

## 2019-07-05 ENCOUNTER — Encounter (HOSPITAL_COMMUNITY)
Admission: AD | Disposition: A | Discharge status: Other Acute Inpatient Hospital | Payer: Self-pay | Source: Other Acute Inpatient Hospital | Attending: Family Medicine

## 2019-07-05 ENCOUNTER — Inpatient Hospital Stay (HOSPITAL_COMMUNITY): Payer: Medicare Other

## 2019-07-05 ENCOUNTER — Inpatient Hospital Stay (HOSPITAL_COMMUNITY): Payer: Medicare Other | Admitting: Anesthesiology

## 2019-07-05 ENCOUNTER — Encounter (HOSPITAL_COMMUNITY): Payer: Self-pay | Admitting: Internal Medicine

## 2019-07-05 HISTORY — PX: TOTAL HIP ARTHROPLASTY: SHX124

## 2019-07-05 SURGERY — ARTHROPLASTY, HIP, TOTAL, ANTERIOR APPROACH
Anesthesia: Monitor Anesthesia Care | Site: Hip | Laterality: Left

## 2019-07-05 MED ORDER — PROPOFOL 10 MG/ML IV BOLUS
INTRAVENOUS | Status: AC
  Filled 2019-07-05: qty 20

## 2019-07-05 MED ORDER — PHENOL 1.4 % MT LIQD: 1.0000 | OROMUCOSAL | Status: DC | PRN

## 2019-07-05 MED ORDER — KETOROLAC TROMETHAMINE 30 MG/ML IJ SOLN
INTRAMUSCULAR | Status: AC
  Filled 2019-07-05: qty 1

## 2019-07-05 MED ORDER — CHLORHEXIDINE GLUCONATE 0.12 % MT SOLN
OROMUCOSAL | Status: AC
  Administered 2019-07-05: 15 mL via OROMUCOSAL
  Filled 2019-07-05: qty 15

## 2019-07-05 MED ORDER — METOCLOPRAMIDE HCL 5 MG PO TABS: 5.0000 mg | ORAL_TABLET | Freq: Three times a day (TID) | ORAL | Status: DC | PRN

## 2019-07-05 MED ORDER — FENTANYL CITRATE (PF) 250 MCG/5ML IJ SOLN
INTRAMUSCULAR | Status: DC | PRN
  Administered 2019-07-05 (×2): 50 ug via INTRAVENOUS

## 2019-07-05 MED ORDER — BUPIVACAINE-EPINEPHRINE (PF) 0.5% -1:200000 IJ SOLN
INTRAMUSCULAR | Status: DC | PRN
  Administered 2019-07-05: 30 mL

## 2019-07-05 MED ORDER — ONDANSETRON HCL 4 MG/2ML IJ SOLN
4.0000 mg | Freq: Four times a day (QID) | INTRAMUSCULAR | Status: DC | PRN
  Administered 2019-07-05: 4 mg via INTRAVENOUS
  Filled 2019-07-05: qty 2

## 2019-07-05 MED ORDER — FENTANYL CITRATE (PF) 100 MCG/2ML IJ SOLN
25.0000 ug | INTRAMUSCULAR | Status: DC | PRN
  Administered 2019-07-05 (×2): 25 ug via INTRAVENOUS

## 2019-07-05 MED ORDER — BUPIVACAINE-EPINEPHRINE 0.5% -1:200000 IJ SOLN
INTRAMUSCULAR | Status: AC
  Filled 2019-07-05: qty 1

## 2019-07-05 MED ORDER — KETOROLAC TROMETHAMINE 30 MG/ML IJ SOLN
INTRAMUSCULAR | Status: DC | PRN
  Administered 2019-07-05: 30 mg via INTRA_ARTICULAR

## 2019-07-05 MED ORDER — EPHEDRINE 5 MG/ML INJ
INTRAVENOUS | Status: AC
  Filled 2019-07-05: qty 10

## 2019-07-05 MED ORDER — CEFAZOLIN SODIUM-DEXTROSE 2-4 GM/100ML-% IV SOLN
2.0000 g | Freq: Four times a day (QID) | INTRAVENOUS | Status: AC
  Administered 2019-07-05 (×2): 2 g via INTRAVENOUS
  Filled 2019-07-05 (×2): qty 100

## 2019-07-05 MED ORDER — ORAL CARE MOUTH RINSE: 15.0000 mL | Freq: Once | OROMUCOSAL | Status: AC

## 2019-07-05 MED ORDER — 0.9 % SODIUM CHLORIDE (POUR BTL) OPTIME
TOPICAL | Status: DC | PRN
  Administered 2019-07-05: 1000 mL

## 2019-07-05 MED ORDER — LISINOPRIL 20 MG PO TABS
20.0000 mg | ORAL_TABLET | Freq: Every day | ORAL | Status: DC
  Administered 2019-07-06 – 2019-07-17 (×12): 20 mg via ORAL
  Filled 2019-07-05 (×12): qty 1

## 2019-07-05 MED ORDER — METOCLOPRAMIDE HCL 5 MG/ML IJ SOLN: 5.0000 mg | Freq: Three times a day (TID) | INTRAMUSCULAR | Status: DC | PRN

## 2019-07-05 MED ORDER — FENTANYL CITRATE (PF) 250 MCG/5ML IJ SOLN
INTRAMUSCULAR | Status: AC
  Filled 2019-07-05: qty 5

## 2019-07-05 MED ORDER — CEFAZOLIN SODIUM-DEXTROSE 2-3 GM-%(50ML) IV SOLR
INTRAVENOUS | Status: DC | PRN
  Administered 2019-07-05: 2 g via INTRAVENOUS

## 2019-07-05 MED ORDER — PROPOFOL 10 MG/ML IV BOLUS
INTRAVENOUS | Status: DC | PRN
  Administered 2019-07-05: 30 mg via INTRAVENOUS
  Administered 2019-07-05: 20 mg via INTRAVENOUS
  Administered 2019-07-05: 30 mg via INTRAVENOUS
  Administered 2019-07-05: 20 mg via INTRAVENOUS
  Administered 2019-07-05: 30 mg via INTRAVENOUS
  Administered 2019-07-05: 20 mg via INTRAVENOUS
  Administered 2019-07-05: 30 mg via INTRAVENOUS
  Administered 2019-07-05: 20 mg via INTRAVENOUS

## 2019-07-05 MED ORDER — SODIUM CHLORIDE (PF) 0.9 % IJ SOLN
INTRAMUSCULAR | Status: DC | PRN
  Administered 2019-07-05: 30 mL

## 2019-07-05 MED ORDER — CHLORHEXIDINE GLUCONATE 0.12 % MT SOLN: 15.0000 mL | Freq: Once | OROMUCOSAL | Status: AC

## 2019-07-05 MED ORDER — ACETAMINOPHEN 325 MG PO TABS
650.0000 mg | ORAL_TABLET | Freq: Four times a day (QID) | ORAL | Status: DC | PRN
  Administered 2019-07-05 – 2019-07-13 (×11): 650 mg via ORAL
  Filled 2019-07-05 (×11): qty 2

## 2019-07-05 MED ORDER — FENTANYL CITRATE (PF) 100 MCG/2ML IJ SOLN
INTRAMUSCULAR | Status: AC
  Administered 2019-07-05: 50 ug via INTRAVENOUS
  Filled 2019-07-05: qty 2

## 2019-07-05 MED ORDER — SODIUM CHLORIDE 0.9 % IR SOLN
Status: DC | PRN
  Administered 2019-07-05: 1000 mL
  Administered 2019-07-05: 3000 mL

## 2019-07-05 MED ORDER — ACETAMINOPHEN 500 MG PO TABS
1000.0000 mg | ORAL_TABLET | Freq: Once | ORAL | Status: AC
  Administered 2019-07-05: 1000 mg via ORAL
  Filled 2019-07-05: qty 2

## 2019-07-05 MED ORDER — LACTATED RINGERS IV SOLN: INTRAVENOUS | Status: DC | PRN

## 2019-07-05 MED ORDER — LIDOCAINE 2% (20 MG/ML) 5 ML SYRINGE
INTRAMUSCULAR | Status: AC
  Filled 2019-07-05: qty 5

## 2019-07-05 MED ORDER — EPHEDRINE SULFATE 50 MG/ML IJ SOLN
INTRAMUSCULAR | Status: DC | PRN
  Administered 2019-07-05 (×4): 10 mg via INTRAVENOUS

## 2019-07-05 MED ORDER — DOCUSATE SODIUM 100 MG PO CAPS
100.0000 mg | ORAL_CAPSULE | Freq: Two times a day (BID) | ORAL | Status: DC
  Administered 2019-07-05 – 2019-07-17 (×21): 100 mg via ORAL
  Filled 2019-07-05 (×22): qty 1

## 2019-07-05 MED ORDER — LIDOCAINE HCL 1 % IJ SOLN
INTRAMUSCULAR | Status: DC | PRN
  Administered 2019-07-05: 80 mg via INTRADERMAL
  Administered 2019-07-05: 20 mg via INTRADERMAL

## 2019-07-05 MED ORDER — MENTHOL 3 MG MT LOZG: 1.0000 | LOZENGE | OROMUCOSAL | Status: DC | PRN

## 2019-07-05 MED ORDER — ONDANSETRON HCL 4 MG PO TABS: 4.0000 mg | ORAL_TABLET | Freq: Four times a day (QID) | ORAL | Status: DC | PRN

## 2019-07-05 MED ORDER — ENOXAPARIN SODIUM 40 MG/0.4ML ~~LOC~~ SOLN
40.0000 mg | SUBCUTANEOUS | Status: DC
  Administered 2019-07-06 – 2019-07-07 (×2): 40 mg via SUBCUTANEOUS
  Filled 2019-07-05 (×2): qty 0.4

## 2019-07-05 MED ORDER — BUPIVACAINE IN DEXTROSE 0.75-8.25 % IT SOLN
INTRATHECAL | Status: DC | PRN
  Administered 2019-07-05: 1.6 mL via INTRATHECAL

## 2019-07-05 SURGICAL SUPPLY — 56 items
ALCOHOL 70% 16 OZ (MISCELLANEOUS) ×3 IMPLANT
AML 16.5 LRG 12/14 OFFSET (Hips) ×3 IMPLANT
CHLORAPREP W/TINT 26 (MISCELLANEOUS) ×3 IMPLANT
COVER SURGICAL LIGHT HANDLE (MISCELLANEOUS) ×3 IMPLANT
CUP ACET PINNACLE SECTR 48MM (Joint) IMPLANT
DERMABOND ADVANCED (GAUZE/BANDAGES/DRESSINGS) ×2
DERMABOND ADVANCED .7 DNX12 (GAUZE/BANDAGES/DRESSINGS) ×2 IMPLANT
DRAPE C-ARM 42X72 X-RAY (DRAPES) ×3 IMPLANT
DRAPE STERI IOBAN 125X83 (DRAPES) ×3 IMPLANT
DRAPE U-SHAPE 47X51 STRL (DRAPES) ×7 IMPLANT
DRSG AQUACEL AG ADV 3.5X10 (GAUZE/BANDAGES/DRESSINGS) ×3 IMPLANT
ELECT BLADE 4.0 EZ CLEAN MEGAD (MISCELLANEOUS) ×3
ELECT PENCIL ROCKER SW 15FT (MISCELLANEOUS) ×3 IMPLANT
ELECT REM PT RETURN 9FT ADLT (ELECTROSURGICAL) ×3
ELECTRODE BLDE 4.0 EZ CLN MEGD (MISCELLANEOUS) ×1 IMPLANT
ELECTRODE REM PT RTRN 9FT ADLT (ELECTROSURGICAL) ×1 IMPLANT
GLOVE BIO SURGEON STRL SZ8.5 (GLOVE) ×6 IMPLANT
GLOVE BIOGEL PI IND STRL 8.5 (GLOVE) ×1 IMPLANT
GLOVE BIOGEL PI INDICATOR 8.5 (GLOVE) ×2
GOWN STRL REUS W/ TWL LRG LVL3 (GOWN DISPOSABLE) ×2 IMPLANT
GOWN STRL REUS W/TWL 2XL LVL3 (GOWN DISPOSABLE) ×3 IMPLANT
GOWN STRL REUS W/TWL LRG LVL3 (GOWN DISPOSABLE) ×6
HANDPIECE INTERPULSE COAX TIP (DISPOSABLE) ×3
HEAD FEM STD 32X+1 STRL (Hips) ×2 IMPLANT
HIP AML 16.5 LRG 12/14 OFFSET (Hips) IMPLANT
HOOD PEEL AWAY FACE SHEILD DIS (HOOD) ×6 IMPLANT
JET LAVAGE IRRISEPT WOUND (IRRIGATION / IRRIGATOR) ×3
KIT BASIN OR (CUSTOM PROCEDURE TRAY) ×3 IMPLANT
KIT TURNOVER KIT B (KITS) ×3 IMPLANT
LAVAGE JET IRRISEPT WOUND (IRRIGATION / IRRIGATOR) ×1 IMPLANT
LINER ACET 32X48 (Liner) ×2 IMPLANT
MANIFOLD NEPTUNE II (INSTRUMENTS) ×3 IMPLANT
MARKER SKIN DUAL TIP RULER LAB (MISCELLANEOUS) ×4 IMPLANT
NDL SPNL 18GX3.5 QUINCKE PK (NEEDLE) ×1 IMPLANT
NEEDLE SPNL 18GX3.5 QUINCKE PK (NEEDLE) ×3 IMPLANT
NS IRRIG 1000ML POUR BTL (IV SOLUTION) ×3 IMPLANT
PACK TOTAL JOINT (CUSTOM PROCEDURE TRAY) ×3 IMPLANT
PACK UNIVERSAL I (CUSTOM PROCEDURE TRAY) ×3 IMPLANT
PAD ARMBOARD 7.5X6 YLW CONV (MISCELLANEOUS) ×6 IMPLANT
PINNSECTOR W/GRIP ACE CUP 48MM (Joint) ×3 IMPLANT
SAW OSC TIP CART 19.5X105X1.3 (SAW) ×3 IMPLANT
SEALER BIPOLAR AQUA 6.0 (INSTRUMENTS) ×2 IMPLANT
SET HNDPC FAN SPRY TIP SCT (DISPOSABLE) ×1 IMPLANT
SOL PREP POV-IOD 4OZ 10% (MISCELLANEOUS) ×3 IMPLANT
SUT ETHIBOND NAB CT1 #1 30IN (SUTURE) ×6 IMPLANT
SUT MNCRL AB 3-0 PS2 18 (SUTURE) ×3 IMPLANT
SUT MON AB 2-0 CT1 36 (SUTURE) ×3 IMPLANT
SUT VIC AB 1 CT1 27 (SUTURE) ×3
SUT VIC AB 1 CT1 27XBRD ANBCTR (SUTURE) ×1 IMPLANT
SUT VIC AB 2-0 CT1 27 (SUTURE) ×3
SUT VIC AB 2-0 CT1 TAPERPNT 27 (SUTURE) ×1 IMPLANT
SUT VLOC 180 0 24IN GS25 (SUTURE) ×3 IMPLANT
SYR 50ML LL SCALE MARK (SYRINGE) ×3 IMPLANT
TOWEL GREEN STERILE (TOWEL DISPOSABLE) ×3 IMPLANT
TOWEL GREEN STERILE FF (TOWEL DISPOSABLE) ×3 IMPLANT
WATER STERILE IRR 1000ML POUR (IV SOLUTION) ×7 IMPLANT

## 2019-07-05 NOTE — Anesthesia Procedure Notes (Signed)
Spinal  Patient location during procedure: OR Start time: 07/05/2019 7:50 AM End time: 07/05/2019 8:03 AM Staffing Performed: anesthesiologist  Anesthesiologist: Gaynelle Adu, MD Preanesthetic Checklist Completed: patient identified, IV checked, risks and benefits discussed, surgical consent, monitors and equipment checked, pre-op evaluation and timeout performed Spinal Block Patient position: right lateral decubitus Prep: DuraPrep Patient monitoring: cardiac monitor, continuous pulse ox and blood pressure Approach: right paramedian (Attempted midline. Unable tto locate space.) Location: L3-4 Injection technique: single-shot Needle Needle type: Quincke (Attempted Pencan 24G. Unable to locate space.)  Needle gauge: 22 G Needle length: 9 cm Assessment Sensory level: T8 Additional Notes Functioning IV was confirmed and monitors were applied. Sterile prep and drape, including hand hygiene and sterile gloves were used. The patient was positioned and the spine was prepped. The skin was anesthetized with lidocaine.  Free flow of clear CSF was obtained prior to injecting local anesthetic into the CSF.  The spinal needle aspirated freely following injection.  The needle was carefully withdrawn.  The patient tolerated the procedure well.

## 2019-07-05 NOTE — Transfer of Care (Signed)
Immediate Anesthesia Transfer of Care Note  Patient: Sabrina Glass  Procedure(s) Performed: TOTAL HIP ARTHROPLASTY ANTERIOR APPROACH (Left Hip)  Patient Location: PACU  Anesthesia Type:Spinal  Level of Consciousness: awake, alert  and oriented  Airway & Oxygen Therapy: Patient Spontanous Breathing  Post-op Assessment: Report given to RN, Post -op Vital signs reviewed and stable and Patient moving all extremities X 4  Post vital signs: Reviewed and stable  Last Vitals:  Vitals Value Taken Time  BP 115/75 07/05/19 1016  Temp    Pulse 88 07/05/19 1017  Resp 20 07/05/19 1017  SpO2 96 % 07/05/19 1017  Vitals shown include unvalidated device data.  Last Pain:  Vitals:   07/05/19 1015  TempSrc:   PainSc: (P) 6       Patients Stated Pain Goal: (P) 0 (07/05/19 1015)  Complications: No complications documented.

## 2019-07-05 NOTE — Progress Notes (Signed)
PROGRESS NOTE    Sutton Plake  VOJ:[id number] DOB: [birth year] DOA: 07/03/2019 PCP: Patient, No Pcp Per   Brief Narrative:   Sabrina Glass is a 86 y.o. female with history of hypertension was brought to the ER at [facility] at [facility] after patient had a fall. she slipped and fell when her leg gave way.  Did not hit her head or lose consciousness. x-rays in the ER showed left hip fracture and patient was transferred to [facility] for further management.  Labs over that showed anemia with hemoglobin around 9 mild leukocytosis chest x-ray showing chronic findings.    Assessment & Plan:   Principal Problem:   Closed left hip fracture, initial encounter [facility]) Active Problems:   Hip fracture ([place])   Essential hypertension   Anemia   Pressure injury of skin  1. Left hip fracture status post mechanical fall: Status post total left hip arthroplasty, anterior approach by Dr. Lyla Glassing on 07/05/2019.  She feels better right now.  Management per orthopedics. 2. Essential hypertension: Finally her medications from home have been reconciled and she is on lisinopril 20 mg p.o. daily.  Currently her blood pressure is completely within normal range so I will resume this starting tomorrow.  Continue as needed hydralazine in the meantime. 3. Anemia hemoglobin appears to be at the same level when compared to the one done in [place].  Morning labs are still pending. 4. Leukocytosis could be reactionary patient is afebrile.  Closely monitor.  Morning labs are still pending. 5. Mild hypokalemia -was 3.4 yesterday.  Morning labs are still pending.    DVT prophylaxis: enoxaparin (LOVENOX) injection 40 mg Start: 07/06/19 0800 SCDs Start: 07/05/19 1054   Code Status: Full Code  Family Communication: None present at bedside.  Plan of care discussed with patient in length and he verbalized understanding and agreed with it.  Status is: Inpatient  Remains inpatient appropriate  because:Inpatient level of care appropriate due to severity of illness   Dispo: The patient is from: Home              Anticipated d/c is to: SNF              Anticipated d/c date is: 2 to 3 days              Patient currently is not medically stable to d/c.        Estimated body mass index is 20.2 kg/m as calculated from the following:   Height as of this encounter: 5\' 1"  (1.549 m).   Weight as of this encounter: 48.5 kg.  Pressure Injury 07/03/19 Coccyx Medial;Lower Stage 2 -  Partial thickness loss of dermis presenting as a shallow open injury with a red, pink wound bed without slough. (Active)  07/03/19 2154  Location: Coccyx  Location Orientation: Medial;Lower  Staging: Stage 2 -  Partial thickness loss of dermis presenting as a shallow open injury with a red, pink wound bed without slough.  Wound Description (Comments):   Present on Admission: Yes     Nutritional status:  Nutrition Problem: Increased nutrient needs Etiology: hip fracture, wound healing (Left hip fracture pending surgical intervention, stage II PI present on admission)   Signs/Symptoms: estimated needs   Interventions: Ensure Enlive (each supplement provides 350kcal and 20 grams of protein), Refer to RD note for recommendations    Consultants:   Orthopedics  Procedures:   Left total hip arthroplasty  Antimicrobials:  Anti-infectives (From admission, onward)  Start     Dose/Rate Route Frequency Ordered Stop   07/05/19 1100  ceFAZolin (ANCEF) IVPB 2g/100 mL premix     Discontinue     2 g 200 mL/hr over 30 Minutes Intravenous Every 6 hours 07/05/19 1053 07/05/19 2259   07/04/19 0730  ceFAZolin (ANCEF) IVPB 2g/100 mL premix  Status:  Discontinued        2 g 200 mL/hr over 30 Minutes Intravenous On call to O.R. 07/04/19 0720 07/05/19 0559   07/04/19 0730  ceFAZolin (ANCEF) 3 g in dextrose 5 % 50 mL IVPB  Status:  Discontinued        3 g 100 mL/hr over 30 Minutes Intravenous On call to  O.R. 07/04/19 0720 07/04/19 0723         Subjective: Patient seen and examined after she returned from the OR.  Feels much better.  No pain.  No other complaint.  Objective: Vitals:   07/05/19 0410 07/05/19 1015 07/05/19 1030 07/05/19 1149  BP: (!) 175/86 115/75 124/81 124/79  Pulse: 80 80 88 94  Resp: 15 (!) 22 19 15   Temp: 98.6 F (37 C) (!) 97 F (36.1 C) 98.3 F (36.8 C) 98 F (36.7 C)  TempSrc: Oral   Oral  SpO2: 94% 94% 96% 97%  Weight:      Height:        Intake/Output Summary (Last 24 hours) at 07/05/2019 1153 Last data filed at 07/05/2019 1016 Gross per 24 hour  Intake 1974 ml  Output 1900 ml  Net 74 ml   Filed Weights   07/04/19 0902  Weight: 48.5 kg    Examination:  General exam: Appears calm and comfortable  Respiratory system: Clear to auscultation. Respiratory effort normal. Cardiovascular system: S1 & S2 heard, RRR. No JVD, murmurs, rubs, gallops or clicks. No pedal edema. Gastrointestinal system: Abdomen is nondistended, soft and nontender. No organomegaly or masses felt. Normal bowel sounds heard. Central nervous system: Alert and oriented. No focal neurological deficits. Skin: No rashes, lesions or ulcers.  Psychiatry: Judgement and insight appear normal. Mood & affect appropriate.   Data Reviewed: I have personally reviewed following labs and imaging studies  CBC: Recent Labs  Lab 07/04/19 0054  WBC 13.2*  NEUTROABS 9.2*  HGB 9.7*  HCT 32.0*  MCV 87.2  PLT 411*   Basic Metabolic Panel: Recent Labs  Lab 07/04/19 0054 07/04/19 1134  NA 137 136  K 3.2* 3.4*  CL 100 100  CO2 27 28  GLUCOSE 116* 108*  BUN 6* 8  CREATININE 0.61 0.63  CALCIUM 8.6* 8.1*   GFR: Estimated Creatinine Clearance: 35.3 mL/min (by C-G formula based on SCr of 0.63 mg/dL). Liver Function Tests: Recent Labs  Lab 07/04/19 0054  AST 23  ALT 18  ALKPHOS 85  BILITOT 0.5  PROT 7.1  ALBUMIN 2.8*   No results for input(s): LIPASE, AMYLASE in the last 168  hours. No results for input(s): AMMONIA in the last 168 hours. Coagulation Profile: No results for input(s): INR, PROTIME in the last 168 hours. Cardiac Enzymes: No results for input(s): CKTOTAL, CKMB, CKMBINDEX, TROPONINI in the last 168 hours. BNP (last 3 results) No results for input(s): PROBNP in the last 8760 hours. HbA1C: No results for input(s): HGBA1C in the last 72 hours. CBG: No results for input(s): GLUCAP in the last 168 hours. Lipid Profile: No results for input(s): CHOL, HDL, LDLCALC, TRIG, CHOLHDL, LDLDIRECT in the last 72 hours. Thyroid Function Tests: No results for input(s): TSH,  T4TOTAL, FREET4, T3FREE, THYROIDAB in the last 72 hours. Anemia Panel: No results for input(s): VITAMINB12, FOLATE, FERRITIN, TIBC, IRON, RETICCTPCT in the last 72 hours. Sepsis Labs: No results for input(s): PROCALCITON, LATICACIDVEN in the last 168 hours.  Recent Results (from the past 240 hour(s))  SARS Coronavirus 2 by RT PCR (hospital order, performed in [facility] hospital lab) Nasopharyngeal Nasopharyngeal Swab     Status: None   Collection Time: 07/04/19  5:03 AM   Specimen: Nasopharyngeal Swab  Result Value Ref Range Status   SARS Coronavirus 2 NEGATIVE NEGATIVE Final    Comment: (NOTE) SARS-CoV-2 target nucleic acids are NOT DETECTED.  The SARS-CoV-2 RNA is generally detectable in upper and lower respiratory specimens during the acute phase of infection. The lowest concentration of SARS-CoV-2 viral copies this assay can detect is 250 copies / mL. A negative result does not preclude SARS-CoV-2 infection and should not be used as the sole basis for treatment or other patient management decisions.  A negative result may occur with improper specimen collection / handling, submission of specimen other than nasopharyngeal swab, presence of viral mutation(s) within the areas targeted by this assay, and inadequate number of viral copies (<250 copies / mL). A negative result must be  combined with clinical observations, patient history, and epidemiological information.  Fact Sheet for Patients:   BoilerBrush.com.cy  Fact Sheet for Healthcare Providers: https://pope.com/  This test is not yet approved or  cleared by the [place] FDA and has been authorized for detection and/or diagnosis of SARS-CoV-2 by FDA under an Emergency Use Authorization (EUA).  This EUA will remain in effect (meaning this test can be used) for the duration of the COVID-19 declaration under Section 564(b)(1) of the Act, 21 U.S.C. section 360bbb-3(b)(1), unless the authorization is terminated or revoked sooner.  Performed at [facility], [address] [place], [place] [address]   Surgical pcr screen     Status: None   Collection Time: 07/04/19 11:14 AM   Specimen: Nasal Mucosa; Nasal Swab  Result Value Ref Range Status   MRSA, PCR NEGATIVE NEGATIVE Final   Staphylococcus aureus NEGATIVE NEGATIVE Final    Comment: (NOTE) The Xpert SA Assay (FDA approved for NASAL specimens in patients 33 years of age and older), is one component of a comprehensive surveillance program. It is not intended to diagnose infection nor to guide or monitor treatment. Performed at [facility], [address], [place], [place] [address]       Radiology Studies: Pelvis Portable  Result Date: 07/05/2019 CLINICAL DATA:  Status post left total hip replacement EXAM: PORTABLE PELVIS 1-2 VIEWS COMPARISON:  July 04, 2019 FINDINGS: Frontal pelvis image obtained. There is a total hip replacement on the left with prosthetic components well-seated. No acute fracture or dislocation evident on frontal view. Moderate narrowing right hip joint. Bones osteoporotic. There are multiple foci of atherosclerotic arterial vascular calcification in the proximal thigh regions. IMPRESSION: Status post total hip replacement on the left with prosthetic components  well-seated. Bones osteoporotic. No fracture or dislocation evident on frontal view. Moderate narrowing right hip joint. Electronically Signed   By: Bretta Bang III M.D.   On: 07/05/2019 10:58   DG Knee Left Port  Result Date: 07/04/2019 CLINICAL DATA:  Hip fracture.  Left knee pain. EXAM: PORTABLE LEFT KNEE - 1-2 VIEW COMPARISON:  None. FINDINGS: No evidence of fracture, dislocation, or joint effusion. No evidence of arthropathy or other focal bone abnormality. Soft tissues are  unremarkable. IMPRESSION: Negative. Electronically Signed   By: Deatra Robinson M.D.   On: 07/04/2019 06:38   DG C-Arm 1-60 Min  Result Date: 07/05/2019 CLINICAL DATA:  Status post total hip arthroplasty EXAM: DG C-ARM 1-60 MIN; OPERATIVE LEFT HIP WITH PELVIS FLUOROSCOPY TIME:  Fluoroscopy Time:  0 minutes 13 seconds Radiation Exposure Index (if provided by the fluoroscopic device): 1.12 mGy Number of Acquired Spot Images: 2 COMPARISON:  July 04, 2019 FINDINGS: Frontal lower pelvis and frontal left hip images obtained. There is a total hip replacement on the left with prosthetic components well-seated on frontal view. No acute fracture or dislocation. Moderate narrowing right hip joint. IMPRESSION: Status post total hip replacement on the left with prosthetic components well-seated on frontal view. No fracture or dislocation evident. Electronically Signed   By: Bretta Bang III M.D.   On: 07/05/2019 11:35   DG HIP OPERATIVE UNILAT W OR W/O PELVIS LEFT  Result Date: 07/05/2019 CLINICAL DATA:  Status post total hip arthroplasty EXAM: DG C-ARM 1-60 MIN; OPERATIVE LEFT HIP WITH PELVIS FLUOROSCOPY TIME:  Fluoroscopy Time:  0 minutes 13 seconds Radiation Exposure Index (if provided by the fluoroscopic device): 1.12 mGy Number of Acquired Spot Images: 2 COMPARISON:  July 04, 2019 FINDINGS: Frontal lower pelvis and frontal left hip images obtained. There is a total hip replacement on the left with prosthetic components  well-seated on frontal view. No acute fracture or dislocation. Moderate narrowing right hip joint. IMPRESSION: Status post total hip replacement on the left with prosthetic components well-seated on frontal view. No fracture or dislocation evident. Electronically Signed   By: Bretta Bang III M.D.   On: 07/05/2019 11:35   DG HIP UNILAT WITH PELVIS 2-3 VIEWS LEFT  Result Date: 07/04/2019 CLINICAL DATA:  Hip fracture EXAM: DG HIP (WITH OR WITHOUT PELVIS) 2-3V LEFT COMPARISON:  None. FINDINGS: There is a mildly displaced medially angulated fracture of the left femoral neck. No dislocation. No other pelvic fracture. There is moderate left hip osteoarthrosis. IMPRESSION: Mildly displaced, medially angulated fracture of the left femoral neck. Electronically Signed   By: Deatra Robinson M.D.   On: 07/04/2019 06:35    Scheduled Meds: . docusate sodium  100 mg Oral BID  . [START ON 07/06/2019] enoxaparin (LOVENOX) injection  40 mg Subcutaneous Q24H  . feeding supplement (ENSURE ENLIVE)  237 mL Oral Q24H  . pantoprazole  40 mg Oral Daily   Continuous Infusions: .  ceFAZolin (ANCEF) IV       LOS: 2 days   Time spent: 28 minutes   Hughie Closs, MD [facility]  07/05/2019, 11:53 AM   To contact the attending provider between 7A-7P or the covering provider during after hours 7P-7A, please log into the web site [email or web address].

## 2019-07-05 NOTE — Progress Notes (Signed)
Pt has been refusing pulse ox monitoring. Pt alert/oriented in no apparent distress. Attending MD made awarwe.

## 2019-07-05 NOTE — Anesthesia Procedure Notes (Signed)
Date/Time: 07/05/2019 7:44 AM Performed by: Melina Schools, CRNA Pre-anesthesia Checklist: Patient identified, Emergency Drugs available, Suction available, Patient being monitored and Timeout performed Patient Re-evaluated:Patient Re-evaluated prior to induction Oxygen Delivery Method: Simple face mask Dental Injury: Teeth and Oropharynx as per pre-operative assessment

## 2019-07-05 NOTE — Plan of Care (Signed)

## 2019-07-05 NOTE — Anesthesia Postprocedure Evaluation (Signed)
Anesthesia Post Note  Patient: Sabrina Glass  Procedure(s) Performed: TOTAL HIP ARTHROPLASTY ANTERIOR APPROACH (Left Hip)     Patient location during evaluation: PACU Anesthesia Type: MAC and Spinal Level of consciousness: oriented and awake and alert Pain management: pain level controlled Vital Signs Assessment: post-procedure vital signs reviewed and stable Respiratory status: spontaneous breathing and respiratory function stable Cardiovascular status: blood pressure returned to baseline and stable Postop Assessment: no headache, no backache, no apparent nausea or vomiting, spinal receding and patient able to bend at knees Anesthetic complications: no   No complications documented.  Last Vitals:  Vitals:   07/05/19 1015 07/05/19 1030  BP: 115/75 124/81  Pulse: 80 88  Resp: (!) 22 19  Temp: (!) 36.1 C 36.8 C  SpO2: 94% 96%    Last Pain:  Vitals:   07/05/19 1015  TempSrc:   PainSc: 6                  Ravon Mcilhenny,W. EDMOND

## 2019-07-05 NOTE — Progress Notes (Signed)
Updated PACU staff and Anesthesiologist with patient concerns overnight and this morning. Noted R IV site dislodged by patient.  2 attempts made to restart, unsuccessful. OR team made aware. Also, patient noted with systolic Blood pressure at 176. Informed OR staff unable to given prn ordered for elevated systolic and ANCEF because no IV access.   Accompanied Transport with patient to the  OR Suite. Telemetry box removed, Central tele called to place on hold till patient returns to floor.

## 2019-07-05 NOTE — Anesthesia Preprocedure Evaluation (Addendum)
Anesthesia Evaluation  Patient identified by MRN, date of birth, ID band Patient awake    Reviewed: Allergy & Precautions, H&P , NPO status , Patient's Chart, lab work & pertinent test results  Airway Mallampati: II  TM Distance: >3 FB Neck ROM: Full    Dental no notable dental hx. (+) Edentulous Upper, Edentulous Lower, Dental Advisory Given   Pulmonary neg pulmonary ROS,    Pulmonary exam normal breath sounds clear to auscultation       Cardiovascular hypertension, On Medications  Rhythm:Regular Rate:Normal     Neuro/Psych negative neurological ROS  negative psych ROS   GI/Hepatic negative GI ROS, Neg liver ROS,   Endo/Other  negative endocrine ROS  Renal/GU negative Renal ROS  negative genitourinary   Musculoskeletal   Abdominal   Peds  Hematology negative hematology ROS (+)   Anesthesia Other Findings   Reproductive/Obstetrics negative OB ROS                            Anesthesia Physical Anesthesia Plan  ASA: II  Anesthesia Plan: Spinal and MAC   Post-op Pain Management:    Induction: Intravenous  PONV Risk Score and Plan: 3 and Ondansetron, Propofol infusion and Dexamethasone  Airway Management Planned: Simple Face Mask  Additional Equipment:   Intra-op Plan:   Post-operative Plan:   Informed Consent: I have reviewed the patients History and Physical, chart, labs and discussed the procedure including the risks, benefits and alternatives for the proposed anesthesia with the patient or authorized representative who has indicated his/her understanding and acceptance.     Dental advisory given  Plan Discussed with: CRNA  Anesthesia Plan Comments:        Anesthesia Quick Evaluation

## 2019-07-05 NOTE — Interval H&P Note (Signed)
History and Physical Interval Note:  07/05/2019 7:43 AM  Sabrina Glass  has presented today for surgery, with the diagnosis of FEMORAL NECK FRACTURE.  The various methods of treatment have been discussed with the patient and family. After consideration of risks, benefits and other options for treatment, the patient has consented to  Procedure(s): TOTAL HIP ARTHROPLASTY ANTERIOR APPROACH (Left) as a surgical intervention.  The patient's history has been reviewed, patient examined, no change in status, stable for surgery.  I have reviewed the patient's chart and labs.  Questions were answered to the patient's satisfaction.    The risks, benefits, and alternatives were discussed with the patient. There are risks associated with the surgery including, but not limited to, problems with anesthesia (death), infection, instability (giving out of the joint), dislocation, differences in leg length/angulation/rotation, fracture of bones, loosening or failure of implants, hematoma (blood accumulation) which may require surgical drainage, blood clots, pulmonary embolism, nerve injury (foot drop and lateral thigh numbness), and blood vessel injury. The patient understands these risks and elects to proceed.   Iline Oven Rylinn Linzy

## 2019-07-05 NOTE — Discharge Instructions (Signed)
Dr. Rod Can Joint Replacement Specialist [facility] [address] [place], [place] [address] [phone]   TOTAL HIP REPLACEMENT POSTOPERATIVE DIRECTIONS    Hip Rehabilitation, Guidelines Following Surgery   WEIGHT BEARING Weight bearing as tolerated with assist device (walker, cane, etc) as directed, use it as long as suggested by your surgeon or therapist, typically at least 4-6 weeks.  The results of a hip operation are greatly improved after range of motion and muscle strengthening exercises. Follow all safety measures which are given to protect your hip. If any of these exercises cause increased pain or swelling in your joint, decrease the amount until you are comfortable again. Then slowly increase the exercises. Call your caregiver if you have problems or questions.   HOME CARE INSTRUCTIONS  Most of the following instructions are designed to prevent the dislocation of your new hip.  Remove items at home which could result in a fall. This includes throw rugs or furniture in walking pathways.  Continue medications as instructed at time of discharge.  You may have some home medications which will be placed on hold until you complete the course of blood thinner medication.  You may start showering once you are discharged home. Do not remove your dressing. Do not put on socks or shoes without following the instructions of your caregivers.   Sit on chairs with arms. Use the chair arms to help push yourself up when arising.  Arrange for the use of a toilet seat elevator so you are not sitting low.   Walk with walker as instructed.  You may resume a sexual relationship in one month or when given the OK by your caregiver.  Use walker as long as suggested by your caregivers.  You may put full weight on your legs and walk as much as is comfortable. Avoid periods of inactivity such as sitting longer than an hour when not asleep. This helps prevent  blood clots.  You may return to work once you are cleared by Engineer, production.  Do not drive a car for 6 weeks or until released by your surgeon.  Do not drive while taking narcotics.  Wear elastic stockings for two weeks following surgery during the day but you may remove then at night.  Make sure you keep all of your appointments after your operation with all of your doctors and caregivers. You should call the office at the above phone number and make an appointment for approximately two weeks after the date of your surgery. Please pick up a stool softener and laxative for home use as long as you are requiring pain medications.  ICE to the affected hip every three hours for 30 minutes at a time and then as needed for pain and swelling. Continue to use ice on the hip for pain and swelling from surgery. You may notice swelling that will progress down to the foot and ankle.  This is normal after surgery.  Elevate the leg when you are not up walking on it.   It is important for you to complete the blood thinner medication as prescribed by your doctor.  Continue to use the breathing machine which will help keep your temperature down.  It is common for your temperature to cycle up and down following surgery, especially at night when you are not up moving around and exerting yourself.  The breathing machine keeps your lungs expanded and your temperature down.  RANGE OF MOTION AND STRENGTHENING EXERCISES  These exercises  are designed to help you keep full movement of your hip joint. Follow your caregiver's or physical therapist's instructions. Perform all exercises about fifteen times, three times per day or as directed. Exercise both hips, even if you have had only one joint replacement. These exercises can be done on a training (exercise) mat, on the floor, on a table or on a bed. Use whatever works the best and is most comfortable for you. Use music or television while you are exercising so that the exercises  are a pleasant break in your day. This will make your life better with the exercises acting as a break in routine you can look forward to.  Lying on your back, slowly slide your foot toward your buttocks, raising your knee up off the floor. Then slowly slide your foot back down until your leg is straight again.  Lying on your back spread your legs as far apart as you can without causing discomfort.  Lying on your side, raise your upper leg and foot straight up from the floor as far as is comfortable. Slowly lower the leg and repeat.  Lying on your back, tighten up the muscle in the front of your thigh (quadriceps muscles). You can do this by keeping your leg straight and trying to raise your heel off the floor. This helps strengthen the largest muscle supporting your knee.  Lying on your back, tighten up the muscles of your buttocks both with the legs straight and with the knee bent at a comfortable angle while keeping your heel on the floor.   SKILLED REHAB INSTRUCTIONS: If the patient is transferred to a skilled rehab facility following release from the hospital, a list of the current medications will be sent to the facility for the patient to continue.  When discharged from the skilled rehab facility, please have the facility set up the patient's Home Health Physical Therapy prior to being released. Also, the skilled facility will be responsible for providing the patient with their medications at time of release from the facility to include their pain medication and their blood thinner medication. If the patient is still at the rehab facility at time of the two week follow up appointment, the skilled rehab facility will also need to assist the patient in arranging follow up appointment in our office and any transportation needs.  MAKE SURE YOU:  Understand these instructions.  Will watch your condition.  Will get help right away if you are not doing well or get worse.  Pick up stool softner and  laxative for home use following surgery while on pain medications. Do not remove your dressing. The dressing is waterproof--it is OK to take showers. Continue to use ice for pain and swelling after surgery. Do not use any lotions or creams on the incision until instructed by your surgeon. No active abduction for 6 weeks. Total Hip Protocol.

## 2019-07-05 NOTE — Op Note (Signed)
OPERATIVE REPORT  SURGEON: Rod Can, MD   ASSISTANT: Merla Riches, PA-C.  PREOPERATIVE DIAGNOSIS: Displaced Left femoral neck fracture.   POSTOPERATIVE DIAGNOSIS: Displaced Left femoral neck fracture.   PROCEDURE: Left total hip arthroplasty, anterior approach.   IMPLANTS: DePuy AMLstem, size 16.5, large stature. DePuy Pinnacle Cup, size 48 mm. DePuy Altrx liner, size 32 by 48 mm, neutral. DePuy metal head ball, size 32 + 1 mm.  ANESTHESIA:  MAC and Spinal  ANTIBIOTICS: 2g ancef.  ESTIMATED BLOOD LOSS:-250 mL    DRAINS: None.  COMPLICATIONS: None   CONDITION: PACU - hemodynamically stable.   BRIEF CLINICAL NOTE: Sabrina Glass is a 86 y.o. female with a displaced Left femoral neck fracture. The patient was admitted to the hospitalist service and underwent perioperative risk stratification and medical optimization. The risks, benefits, and alternatives to total hip arthroplasty were explained, and the patient elected to proceed.  PROCEDURE IN DETAIL: The patient was taken to the operating room and general anesthesia was induced on the hospital bed.  The patient was then positioned on the Hana table.  All bony prominences were well padded.  The hip was prepped and draped in the normal sterile surgical fashion.  A time-out was called verifying side and site of surgery. Antibiotics were given within 60 minutes of beginning the procedure.  The direct anterior approach to the hip was performed through the Hueter interval.  Lateral femoral circumflex vessels were treated with the Auqumantys. The anterior capsule was exposed and an inverted T capsulotomy was made.  Fracture hematoma was encountered and evacuated. The patient was found to have a comminuted Left subcapital femoral neck fracture.  I freshened the femoral neck cut with a saw.  I removed the femoral neck fragment.  A corkscrew was placed into the head and the head was removed.  This was passed to the back table and  was measured. There was extension of the fracture to the calcar.   Acetabular exposure was achieved, and the pulvinar and labrum were excised. Sequential reaming of the acetabulum was then performed up to a size 47 mm reamer. A 48 mm cup was then opened and impacted into place at approximately 40 degrees of abduction and 20 degrees of anteversion. The final polyethylene liner was impacted into place.    I then gained femoral exposure taking care to protect the abductors and greater trochanter.  This was performed using standard external rotation, extension, and adduction.  The capsule was peeled off the inner aspect of the greater trochanter, taking care to preserve the short external rotators. Proximal femur bone quality was noted to be poor. A cookie cutter was used to enter the femoral canal, and then the femoral canal finder was used to confirm location.  I then sequentially reamed to 16 mm.  I sequentially broached to a 16.5 mm large stature broach. Calcar planer was used on the femoral neck remnant.  I paced a trial neck and a trial head ball. The hip was reduced.  Leg lengths were checked fluoroscopically.  The hip was dislocated and trial components were removed.  I placed the real stem followed by the real spacer and head ball.  The hip was reduced.  Fluoroscopy was used to confirm component position and leg lengths.  At 90 degrees of external rotation and extension, the hip was stable to an anterior directed force.   The wound was copiously irrigated with Irrisept solution and normal saline using pule lavage.  Marcaine solution was injected into the  periarticular soft tissue.  The wound was closed in layers using #1 Vicryl and V-Loc for the fascia, 2-0 Vicryl for the subcutaneous fat, 2-0 Monocryl for the deep dermal layer, 3-0 running Monocryl subcuticular stitch and glue for the skin.  Once the glue was fully dried, an Aquacell Ag dressing was applied.  The patient was then awakened from anesthesia  and transported to the recovery room in stable condition.  Sponge, needle, and instrument counts were correct at the end of the case x2.  The patient tolerated the procedure well and there were no known complications.  Please note that a surgical assistant was a medical necessity for this procedure to perform it in a safe and expeditious manner. Assistant was necessary to provide appropriate retraction of vital neurovascular structures, to prevent femoral fracture, and to allow for anatomic placement of the prosthesis.

## 2019-07-05 NOTE — Plan of Care (Signed)
  Problem: Education: Goal: Knowledge of General Education information will improve Description: Including pain rating scale, medication(s)/side effects and non-pharmacologic comfort measures Outcome: Progressing   Problem: Health Behavior/Discharge Planning: Goal: Ability to manage health-related needs will improve Outcome: Progressing   Problem: Clinical Measurements: Goal: Ability to maintain clinical measurements within normal limits will improve Outcome: Progressing Goal: Will remain free from infection Outcome: Progressing   Problem: Activity: Goal: Risk for activity intolerance will decrease Outcome: Progressing   Problem: Nutrition: Goal: Adequate nutrition will be maintained Outcome: Progressing   Problem: Coping: Goal: Level of anxiety will decrease Outcome: Progressing   Problem: Elimination: Goal: Will not experience complications related to bowel motility Outcome: Progressing   Problem: Pain Managment: Goal: General experience of comfort will improve Outcome: Progressing   Problem: Safety: Goal: Ability to remain free from injury will improve Outcome: Progressing   

## 2019-07-06 ENCOUNTER — Inpatient Hospital Stay (HOSPITAL_COMMUNITY): Payer: Medicare Other

## 2019-07-06 LAB — CBC
HCT: 24 % — ABNORMAL LOW (ref 36.0–46.0)
Hemoglobin: 7.5 g/dL — ABNORMAL LOW (ref 12.0–15.0)
MCH: 27.2 pg (ref 26.0–34.0)
MCHC: 31.3 g/dL (ref 30.0–36.0)
MCV: 87 fL (ref 80.0–100.0)
Platelets: 345 10*3/uL (ref 150–400)
RBC: 2.76 MIL/uL — ABNORMAL LOW (ref 3.87–5.11)
RDW: 14.6 % (ref 11.5–15.5)
WBC: 12.7 10*3/uL — ABNORMAL HIGH (ref 4.0–10.5)
nRBC: 0 % (ref 0.0–0.2)

## 2019-07-06 LAB — BASIC METABOLIC PANEL
Anion gap: 12 (ref 5–15)
BUN: 19 mg/dL (ref 8–23)
CO2: 24 mmol/L (ref 22–32)
Calcium: 8 mg/dL — ABNORMAL LOW (ref 8.9–10.3)
Chloride: 98 mmol/L (ref 98–111)
Creatinine, Ser: 0.82 mg/dL (ref 0.44–1.00)
GFR calc Af Amer: >60 mL/min (ref >60)
GFR calc non Af Amer: >60 mL/min (ref >60)
Glucose, Bld: 111 mg/dL — ABNORMAL HIGH (ref 70–99)
Potassium: 3.8 mmol/L (ref 3.5–5.1)
Sodium: 134 mmol/L — ABNORMAL LOW (ref 135–145)

## 2019-07-06 NOTE — Evaluation (Signed)
Physical Therapy Evaluation Patient Details Name: Sabrina Glass MRN: [id number] DOB: 15-Apr-1927 Today's Date: 07/06/2019   History of Present Illness  Patient is a 86 y/o female who presents with left hip fx s/p fall now s/p left THA 07/05/19. PMH includes HTN.  Clinical Impression  Patient presents with pain and post surgical deficits s/p above surgery. Pt reports living with family and doing her own ADLs with limited walking (furniture walking) PTA using RW PRN. Today, pt requires Mod A for bed mobility and transfers with use of RW for support. Noted to have crepitus throughout LE joints. Able to take a few steps to get to [facility] with assist. Pt is a high fall risk. INstructed pt in there ex. Pt would benefit from SNF to maximize independence and mobility prior to return home. Will follow acutely.    Follow Up Recommendations SNF;Supervision for mobility/OOB    Equipment Recommendations  None recommended by PT    Recommendations for Other Services       Precautions / Restrictions Precautions Precautions: Fall Restrictions Weight Bearing Restrictions: Yes LLE Weight Bearing: Weight bearing as tolerated      Mobility  Bed Mobility Overal bed mobility: Needs Assistance Bed Mobility: Supine to Sit     Supine to sit: Mod assist;HOB elevated     General bed mobility comments: Able with LLE and trunk to get to EOB. + dizziness.  Transfers Overall transfer level: Needs assistance Equipment used: Rolling walker (2 wheeled) Transfers: Sit to/from [facility] Sit to Stand: Mod assist Stand pivot transfers: Min assist       General transfer comment: Assist to power to standing with cues for hand placement; stood from EOB x2, from [facility] x3, difficulty getting upright due to pain. Crepitus in all LE joints esp knees. SPT bed to chair with Min A for balance and RW management. Pulled chair behind pt after using BSC.  Ambulation/Gait                Stairs             Wheelchair Mobility    Modified Rankin (Stroke Patients Only)       Balance Overall balance assessment: Needs assistance Sitting-balance support: Feet supported;No upper extremity supported Sitting balance-Leahy Scale: Fair     Standing balance support: During functional activity Standing balance-Leahy Scale: Poor Standing balance comment: Requires Ue support and external support; total A for pericare                             Pertinent Vitals/Pain Pain Assessment: Faces Faces Pain Scale: Hurts even more Pain Location: left hip with movement Pain Descriptors / Indicators: Operative site guarding;Sore Pain Intervention(s): Monitored during session;Repositioned;Limited activity within patient's tolerance    Home Living Family/patient expects to be discharged to:: Skilled nursing facility Living Arrangements: Children (2 daughters and 2 grandchildren) Available Help at Discharge: Family;Available 24 hours/day Type of Home: House Home Access: Level entry     Home Layout: One level Home Equipment: Walker - 2 wheels;Bedside commode;Wheelchair - Education officer, community - power      Prior Function Level of Independence: Needs assistance   Gait / Transfers Assistance Needed: Used RW " a littie bit", furniture walker  ADL's / Homemaking Assistance Needed: Does her own ADLs per report  Comments: not the best historian. States she did not fall leading to this admission.     Hand Dominance  Extremity/Trunk Assessment   Upper Extremity Assessment Upper Extremity Assessment: Defer to OT evaluation    Lower Extremity Assessment Lower Extremity Assessment: LLE deficits/detail;Generalized weakness LLE Deficits / Details: Able to perform QS and LAQ, post surgical deficits in decreased AROM/strength noted. LLE Sensation: WNL    Cervical / Trunk Assessment Cervical / Trunk Assessment: Kyphotic  Communication   Communication: No difficulties   Cognition Arousal/Alertness: Awake/alert Behavior During Therapy: WFL for tasks assessed/performed Overall Cognitive Status: No family/caregiver present to determine baseline cognitive functioning Area of Impairment: Memory                     Memory: Decreased short-term memory         General Comments: Pt tangential with speech and not directly answering questions.      General Comments      Exercises Total Joint Exercises Ankle Circles/Pumps: AROM;Both;10 reps;Supine Quad Sets: AROM;Both;10 reps;Supine Long Arc Quad: AROM;Both;5 reps;Seated   Assessment/Plan    PT Assessment Patient needs continued PT services  PT Problem List Decreased strength;Decreased mobility;Decreased safety awareness;Pain;Decreased balance;Decreased cognition;Decreased skin integrity       PT Treatment Interventions Therapeutic activities;Gait training;Therapeutic exercise;Patient/family education;Balance training;Functional mobility training    PT Goals (Current goals can be found in the Care Plan section)  Acute Rehab PT Goals Patient Stated Goal: to get up and move PT Goal Formulation: With patient Time For Goal Achievement: 07/20/19 Potential to Achieve Goals: Good    Frequency Min 3X/week   Barriers to discharge        Co-evaluation               AM-PAC PT "6 Clicks" Mobility  Outcome Measure Help needed turning from your back to your side while in a flat bed without using bedrails?: A Little Help needed moving from lying on your back to sitting on the side of a flat bed without using bedrails?: A Lot Help needed moving to and from a bed to a chair (including a wheelchair)?: A Lot Help needed standing up from a chair using your arms (e.g., wheelchair or bedside chair)?: A Lot Help needed to walk in hospital room?: A Lot Help needed climbing 3-5 steps with a railing? : Total 6 Click Score: 12    End of Session Equipment Utilized During Treatment: Gait  belt Activity Tolerance: Patient tolerated treatment well Patient left: in chair;with call bell/phone within reach;with chair alarm set Nurse Communication: Mobility status PT Visit Diagnosis: Pain;Muscle weakness (generalized) (M62.81);Unsteadiness on feet (R26.81);Difficulty in walking, not elsewhere classified (R26.2) Pain - Right/Left: Left Pain - part of body: Hip    Time: 2876-8115 PT Time Calculation (min) (ACUTE ONLY): 33 min   Charges:   PT Evaluation $PT Eval Moderate Complexity: 1 Mod PT Treatments $Therapeutic Activity: 8-22 mins        Vale Haven, PT, DPT Acute Rehabilitation Services Pager [phone] Office [phone]      Blake Divine A Lanier Ensign 07/06/2019, 9:23 AM

## 2019-07-06 NOTE — Progress Notes (Signed)
PROGRESS NOTE    Sabrina Glass  BHA:[id number] DOB: Mar 21, 1927 DOA: 07/03/2019 PCP: Patient, No Pcp Per   Brief Narrative:   Sabrina Glass is a 86 y.o. female with history of hypertension was brought to the ER at [facility] at [facility] after patient had a fall. she slipped and fell when her leg gave way.  Did not hit her head or lose consciousness. x-rays in the ER showed left hip fracture and patient was transferred to [facility] for further management.  Labs over that showed anemia with hemoglobin around 9 mild leukocytosis chest x-ray showing chronic findings.  Patient underwent left hip arthroplasty on 07/05/2019.  Assessment & Plan:   Principal Problem:   Closed left hip fracture, initial encounter [facility]) Active Problems:   Hip fracture (HCC)   Essential hypertension   Anemia   Pressure injury of skin  1. Left hip fracture status post mechanical fall: Status post total left hip arthroplasty, anterior approach by Dr. Linna Caprice on 07/05/2019.  She feels better right now.  Management per orthopedics.  PT recommends SNF.  TOC on board. 2. Essential hypertension: Blood pressure controlled.  Continue lisinopril and as needed hydralazine. 3. Acute blood loss anemia: Hemoglobin dropped from 9.7-7.5 today.  She is stable without any symptoms.  Will recheck later today.  Tomorrow morning again.  Transfuse for hemoglobin less than 7. 4. Leukocytosis could be reactionary patient is afebrile.  Closely monitor.  Morning labs are still pending. 5. Mild hypokalemia: Resolved..  DVT prophylaxis: enoxaparin (LOVENOX) injection 40 mg Start: 07/06/19 0800 SCDs Start: 07/05/19 1054   Code Status: Full Code  Family Communication: None present at bedside.  Plan of care discussed with patient in length and he verbalized understanding and agreed with it.  Status is: Inpatient  Remains inpatient appropriate because:Inpatient level of care appropriate due to severity of illness   Dispo:  The patient is from: Home              Anticipated d/c is to: SNF              Anticipated d/c date is: 07/07/2019              Patient currently is medically stable to d/c.        Estimated body mass index is 20.2 kg/m as calculated from the following:   Height as of this encounter: 5\' 1"  (1.549 m).   Weight as of this encounter: 48.5 kg.  Pressure Injury 07/03/19 Coccyx Medial;Lower Stage 2 -  Partial thickness loss of dermis presenting as a shallow open injury with a red, pink wound bed without slough. (Active)  07/03/19 2154  Location: Coccyx  Location Orientation: Medial;Lower  Staging: Stage 2 -  Partial thickness loss of dermis presenting as a shallow open injury with a red, pink wound bed without slough.  Wound Description (Comments):   Present on Admission: Yes     Nutritional status:  Nutrition Problem: Increased nutrient needs Etiology: hip fracture, wound healing (Left hip fracture pending surgical intervention, stage II PI present on admission)   Signs/Symptoms: estimated needs   Interventions: Ensure Enlive (each supplement provides 350kcal and 20 grams of protein), Refer to RD note for recommendations    Consultants:   Orthopedics  Procedures:   Left total hip arthroplasty  Antimicrobials:  Anti-infectives (From admission, onward)   Start     Dose/Rate Route Frequency Ordered Stop   07/05/19 1100  ceFAZolin (ANCEF) IVPB 2g/100 mL premix  2 g 200 mL/hr over 30 Minutes Intravenous Every 6 hours 07/05/19 1053 07/05/19 1754   07/04/19 0730  ceFAZolin (ANCEF) IVPB 2g/100 mL premix  Status:  Discontinued        2 g 200 mL/hr over 30 Minutes Intravenous On call to O.R. 07/04/19 0720 07/05/19 0559   07/04/19 0730  ceFAZolin (ANCEF) 3 g in dextrose 5 % 50 mL IVPB  Status:  Discontinued        3 g 100 mL/hr over 30 Minutes Intravenous On call to O.R. 07/04/19 0720 07/04/19 0723         Subjective: Patient seen and examined.  She feels better.   No pain or any other complaint.  Objective: Vitals:   07/05/19 1957 07/05/19 2337 07/06/19 0336 07/06/19 0824  BP: 125/71 (!) 109/55 (!) 118/55 119/61  Pulse: 95 80 83 82  Resp: 12 14 12 16   Temp: 97.7 F (36.5 C) 97.7 F (36.5 C) 98.1 F (36.7 C) 98.1 F (36.7 C)  TempSrc: Oral Oral Oral Oral  SpO2: 94% 95% 93% 96%  Weight:      Height:        Intake/Output Summary (Last 24 hours) at 07/06/2019 1325 Last data filed at 07/06/2019 1300 Gross per 24 hour  Intake 828.21 ml  Output 5 ml  Net 823.21 ml   Filed Weights   07/04/19 0902  Weight: 48.5 kg    Examination:  General exam: Appears calm and comfortable  Respiratory system: Clear to auscultation. Respiratory effort normal. Cardiovascular system: S1 & S2 heard, RRR. No JVD, murmurs, rubs, gallops or clicks. No pedal edema. Gastrointestinal system: Abdomen is nondistended, soft and nontender. No organomegaly or masses felt. Normal bowel sounds heard. Central nervous system: Alert and oriented. No focal neurological deficits. Extremities: Symmetric 5 x 5 power. Skin: No rashes, lesions or ulcers.  Psychiatry: Judgement and insight appear normal. Mood & affect appropriate.  Data Reviewed: I have personally reviewed following labs and imaging studies  CBC: Recent Labs  Lab 07/04/19 0054 07/06/19 0203  WBC 13.2* 12.7*  NEUTROABS 9.2*  --   HGB 9.7* 7.5*  HCT 32.0* 24.0*  MCV 87.2 87.0  PLT 411* 941   Basic Metabolic Panel: Recent Labs  Lab 07/04/19 0054 07/04/19 1134 07/06/19 0203  NA 137 136 134*  K 3.2* 3.4* 3.8  CL 100 100 98  CO2 27 28 24   GLUCOSE 116* 108* 111*  BUN 6* 8 19  CREATININE 0.61 0.63 0.82  CALCIUM 8.6* 8.1* 8.0*   GFR: Estimated Creatinine Clearance: 34.4 mL/min (by C-G formula based on SCr of 0.82 mg/dL). Liver Function Tests: Recent Labs  Lab 07/04/19 0054  AST 23  ALT 18  ALKPHOS 85  BILITOT 0.5  PROT 7.1  ALBUMIN 2.8*   No results for input(s): LIPASE, AMYLASE in the  last 168 hours. No results for input(s): AMMONIA in the last 168 hours. Coagulation Profile: No results for input(s): INR, PROTIME in the last 168 hours. Cardiac Enzymes: No results for input(s): CKTOTAL, CKMB, CKMBINDEX, TROPONINI in the last 168 hours. BNP (last 3 results) No results for input(s): PROBNP in the last 8760 hours. HbA1C: No results for input(s): HGBA1C in the last 72 hours. CBG: No results for input(s): GLUCAP in the last 168 hours. Lipid Profile: No results for input(s): CHOL, HDL, LDLCALC, TRIG, CHOLHDL, LDLDIRECT in the last 72 hours. Thyroid Function Tests: No results for input(s): TSH, T4TOTAL, FREET4, T3FREE, THYROIDAB in the last 72 hours. Anemia Panel: No  results for input(s): VITAMINB12, FOLATE, FERRITIN, TIBC, IRON, RETICCTPCT in the last 72 hours. Sepsis Labs: No results for input(s): PROCALCITON, LATICACIDVEN in the last 168 hours.  Recent Results (from the past 240 hour(s))  SARS Coronavirus 2 by RT PCR (hospital order, performed in [facility] hospital lab) Nasopharyngeal Nasopharyngeal Swab     Status: None   Collection Time: 07/04/19  5:03 AM   Specimen: Nasopharyngeal Swab  Result Value Ref Range Status   SARS Coronavirus 2 NEGATIVE NEGATIVE Final    Comment: (NOTE) SARS-CoV-2 target nucleic acids are NOT DETECTED.  The SARS-CoV-2 RNA is generally detectable in upper and lower respiratory specimens during the acute phase of infection. The lowest concentration of SARS-CoV-2 viral copies this assay can detect is 250 copies / mL. A negative result does not preclude SARS-CoV-2 infection and should not be used as the sole basis for treatment or other patient management decisions.  A negative result may occur with improper specimen collection / handling, submission of specimen other than nasopharyngeal swab, presence of viral mutation(s) within the areas targeted by this assay, and inadequate number of viral copies (<250 copies / mL). A negative  result must be combined with clinical observations, patient history, and epidemiological information.  Fact Sheet for Patients:   BoilerBrush.com.cy  Fact Sheet for Healthcare Providers: https://pope.com/  This test is not yet approved or  cleared by the [place] FDA and has been authorized for detection and/or diagnosis of SARS-CoV-2 by FDA under an Emergency Use Authorization (EUA).  This EUA will remain in effect (meaning this test can be used) for the duration of the COVID-19 declaration under Section 564(b)(1) of the Act, 21 U.S.C. section 360bbb-3(b)(1), unless the authorization is terminated or revoked sooner.  Performed at [facility], [address] [place], [place] [address]   Surgical pcr screen     Status: None   Collection Time: 07/04/19 11:14 AM   Specimen: Nasal Mucosa; Nasal Swab  Result Value Ref Range Status   MRSA, PCR NEGATIVE NEGATIVE Final   Staphylococcus aureus NEGATIVE NEGATIVE Final    Comment: (NOTE) The Xpert SA Assay (FDA approved for NASAL specimens in patients 68 years of age and older), is one component of a comprehensive surveillance program. It is not intended to diagnose infection nor to guide or monitor treatment. Performed at [facility], [address], [place], [place] [address]       Radiology Studies: CT LUMBAR SPINE WO CONTRAST  Result Date: 07/05/2019 CLINICAL DATA:  86 year old female with low back pain. Recent fall with hip fracture and ORIF left hip. EXAM: CT LUMBAR SPINE WITHOUT CONTRAST TECHNIQUE: Multidetector CT imaging of the lumbar spine was performed without intravenous contrast administration. Multiplanar CT image reconstructions were also generated. COMPARISON:  Hip and pelvis radiographs today. FINDINGS: Segmentation: Normal assuming hypoplastic ribs at T12, full size ribs on the left at T11 (hypoplastic right T11 rib). Alignment: Moderate scoliosis mostly  levoconvex in the lumbar spine. Subsequent straightening of lumbar lordosis. Subtle anterolisthesis of L2 on L3. Vertebrae: Severe T12 compression fracture, although appears likely to be chronic. No acute endplate fracture lucency is evident. The visible T11 level appears intact. Lumbar vertebrae appear intact. Osteopenia. No sacral fracture is identified. SI joints appear intact. Paraspinal and other soft tissues: Extensive Calcified aortic atherosclerosis. Partially visible gastric hiatal hernia. Simple fluid density left renal upper pole cyst. Mild ectasia of the abdominal aorta. Negative visible other noncontrast abdominal viscera. Lumbar paraspinal soft tissues appear  negative. Disc levels: Intermittent severe lumbar disc and endplate degeneration, sparing L2-L3 and L5-S1. Vacuum disc and severe disc space loss elsewhere. Subsequent multifactorial mild to moderate spinal stenosis at L3-L4. Up to mild lumbar spinal stenosis elsewhere. IMPRESSION: 1. Osteopenia. Severe T12 compression fracture although most likely chronic. No acute osseous abnormality identified in the lumbar spine or visible sacrum. Lumbar MRI (noncontrast) or Nuclear Medicine Whole-body Bone Scan would confirm as necessary. 2. Moderate lumbar scoliosis with advanced disc and endplate degeneration. Up to moderate degenerative spinal stenosis at L3-L4. 3. Aortic Atherosclerosis (ICD10-I70.0).  Hiatal hernia. Electronically Signed   By: Odessa Fleming M.D.   On: 07/05/2019 17:13   Pelvis Portable  Result Date: 07/05/2019 CLINICAL DATA:  Status post left total hip replacement EXAM: PORTABLE PELVIS 1-2 VIEWS COMPARISON:  July 04, 2019 FINDINGS: Frontal pelvis image obtained. There is a total hip replacement on the left with prosthetic components well-seated. No acute fracture or dislocation evident on frontal view. Moderate narrowing right hip joint. Bones osteoporotic. There are multiple foci of atherosclerotic arterial vascular calcification in the  proximal thigh regions. IMPRESSION: Status post total hip replacement on the left with prosthetic components well-seated. Bones osteoporotic. No fracture or dislocation evident on frontal view. Moderate narrowing right hip joint. Electronically Signed   By: Bretta Bang III M.D.   On: 07/05/2019 10:58   DG C-Arm 1-60 Min  Result Date: 07/05/2019 CLINICAL DATA:  Status post total hip arthroplasty EXAM: DG C-ARM 1-60 MIN; OPERATIVE LEFT HIP WITH PELVIS FLUOROSCOPY TIME:  Fluoroscopy Time:  0 minutes 13 seconds Radiation Exposure Index (if provided by the fluoroscopic device): 1.12 mGy Number of Acquired Spot Images: 2 COMPARISON:  July 04, 2019 FINDINGS: Frontal lower pelvis and frontal left hip images obtained. There is a total hip replacement on the left with prosthetic components well-seated on frontal view. No acute fracture or dislocation. Moderate narrowing right hip joint. IMPRESSION: Status post total hip replacement on the left with prosthetic components well-seated on frontal view. No fracture or dislocation evident. Electronically Signed   By: Bretta Bang III M.D.   On: 07/05/2019 11:35   DG HIP OPERATIVE UNILAT W OR W/O PELVIS LEFT  Result Date: 07/05/2019 CLINICAL DATA:  Status post total hip arthroplasty EXAM: DG C-ARM 1-60 MIN; OPERATIVE LEFT HIP WITH PELVIS FLUOROSCOPY TIME:  Fluoroscopy Time:  0 minutes 13 seconds Radiation Exposure Index (if provided by the fluoroscopic device): 1.12 mGy Number of Acquired Spot Images: 2 COMPARISON:  July 04, 2019 FINDINGS: Frontal lower pelvis and frontal left hip images obtained. There is a total hip replacement on the left with prosthetic components well-seated on frontal view. No acute fracture or dislocation. Moderate narrowing right hip joint. IMPRESSION: Status post total hip replacement on the left with prosthetic components well-seated on frontal view. No fracture or dislocation evident. Electronically Signed   By: Bretta Bang III  M.D.   On: 07/05/2019 11:35    Scheduled Meds: . docusate sodium  100 mg Oral BID  . enoxaparin (LOVENOX) injection  40 mg Subcutaneous Q24H  . feeding supplement (ENSURE ENLIVE)  237 mL Oral Q24H  . lisinopril  20 mg Oral Daily  . pantoprazole  40 mg Oral Daily   Continuous Infusions:    LOS: 3 days   Time spent: 27 minutes   Hughie Closs, MD [facility]  07/06/2019, 1:25 PM   To contact the attending provider between 7A-7P or the covering provider during after hours 7P-7A, please log into the  web site [email or web address].

## 2019-07-06 NOTE — Progress Notes (Signed)
Foley removed per MD order, Patient due to void by 12 noon today. Day shift Nurse updated.

## 2019-07-06 NOTE — NC FL2 (Signed)
[place] LEVEL OF CARE SCREENING TOOL     IDENTIFICATION  Patient Name: Sabrina Glass Birthdate: [birth year] Sex: female Admission Date (Current Location): 07/03/2019  [facility] and [place] Number:  Herbalist and Address:  The . [facility], [place] [address] [place], [place] [address]      Provider Number: [id number]  Attending Physician Name and Address:  Darliss Cheney, MD  Relative Name and Phone Number:       Current Level of Care: Hospital Recommended Level of Care: [place] Prior Approval Number:    Date Approved/Denied:   PASRR Number:    Discharge Plan: SNF    Current Diagnoses: Patient Active Problem List   Diagnosis Date Noted  . Essential hypertension 07/04/2019  . Anemia 07/04/2019  . Pressure injury of skin 07/04/2019  . Closed left hip fracture, initial encounter ([place]) 07/03/2019  . Hip fracture ([place]) 07/03/2019    Orientation RESPIRATION BLADDER Height & Weight     Self, Time, Situation, Place  Normal Continent Weight: 48.5 kg Height:  5\' 1"  (154.9 cm)  BEHAVIORAL SYMPTOMS/MOOD NEUROLOGICAL BOWEL NUTRITION STATUS      Continent Diet (refer to d/c summary)  AMBULATORY STATUS COMMUNICATION OF NEEDS Skin   Extensive Assist Verbally Surgical wounds (s/p L hip fx repair, left THA 07/05/19)                       Personal Care Assistance Level of Assistance  Feeding, Bathing, Dressing Bathing Assistance: Maximum assistance Feeding assistance: Independent Dressing Assistance: Maximum assistance     Functional Limitations Info  Sight, Hearing, Speech Sight Info: Adequate Hearing Info: Adequate Speech Info: Adequate    SPECIAL CARE FACTORS FREQUENCY  PT (By licensed PT), OT (By licensed OT)     PT Frequency: 5 x/week , evaluate and treat OT Frequency: 5 x/week , evaluate and treat            Contractures Contractures Info: Not present    Additional Factors Info  Code Status,  Allergies Code Status Info: Full code Allergies Info: Codeine, Penicillins, Sulfa Antibiotics           Current Medications (07/06/2019):  This is the current hospital active medication list Current Facility-Administered Medications  Medication Dose Route Frequency Provider Last Rate Last Admin  . acetaminophen (TYLENOL) tablet 650 mg  650 mg Oral Q6H PRN Darliss Cheney, MD   650 mg at 07/06/19 0354  . ALPRAZolam Duanne Moron) tablet 0.5 mg  0.5 mg Oral Daily PRN Rod Can, MD   0.5 mg at 07/05/19 2138  . docusate sodium (COLACE) capsule 100 mg  100 mg Oral BID Rod Can, MD   100 mg at 07/06/19 0913  . enoxaparin (LOVENOX) injection 40 mg  40 mg Subcutaneous Q24H Rod Can, MD   40 mg at 07/06/19 0911  . feeding supplement (ENSURE ENLIVE) (ENSURE ENLIVE) liquid 237 mL  237 mL Oral Q24H Rod Can, MD   237 mL at 07/05/19 1200  . hydrALAZINE (APRESOLINE) injection 10 mg  10 mg Intravenous Q6H PRN Swinteck, Aaron Edelman, MD      . HYDROcodone-acetaminophen (NORCO/VICODIN) 5-325 MG per tablet 1-2 tablet  1-2 tablet Oral Q6H PRN Rod Can, MD   2 tablet at 07/06/19 0912  . lisinopril (ZESTRIL) tablet 20 mg  20 mg Oral Daily Darliss Cheney, MD   20 mg at 07/06/19 0913  . menthol-cetylpyridinium (CEPACOL) lozenge 3 mg  1 lozenge Oral PRN Swinteck, Aaron Edelman,  MD       Or  . phenol (CHLORASEPTIC) mouth spray 1 spray  1 spray Mouth/Throat PRN Swinteck, Arlys John, MD      . metoCLOPramide (REGLAN) tablet 5-10 mg  5-10 mg Oral Q8H PRN Swinteck, Arlys John, MD       Or  . metoCLOPramide (REGLAN) injection 5-10 mg  5-10 mg Intravenous Q8H PRN Swinteck, Arlys John, MD      . morphine 2 MG/ML injection 0.5 mg  0.5 mg Intravenous Q2H PRN Samson Frederic, MD   0.5 mg at 07/06/19 0138  . ondansetron (ZOFRAN) tablet 4 mg  4 mg Oral Q6H PRN Swinteck, Arlys John, MD       Or  . ondansetron (ZOFRAN) injection 4 mg  4 mg Intravenous Q6H PRN Samson Frederic, MD   4 mg at 07/05/19 1747  . pantoprazole (PROTONIX) EC tablet  40 mg  40 mg Oral Daily Samson Frederic, MD   40 mg at 07/06/19 6381     Discharge Medications: Please see discharge summary for a list of discharge medications.  Relevant Imaging Results:  Relevant Lab Results:   Additional Information    Epifanio Lesches, RN

## 2019-07-06 NOTE — Plan of Care (Signed)

## 2019-07-06 NOTE — TOC Initial Note (Addendum)
Transition of Care [facility]) - Initial/Assessment Note    Patient Details  Name: Sabrina Glass MRN: [id number] Date of Birth: Mar 31, 1927  [facility] CM/SW Contact:    Sabrina Lesches, RN Phone Number: 07/06/2019, 9:58 AM  Clinical Narrative:       Admitted s/p fall, suffered left hip fracture.  Pt from home with family ( 2 daughters/ 3 grandchildren).          - s/p Left total hip arthroplasty, NCM received consult for possible SNF placement at time of discharge. NCM spoke with patient regarding PT recommendation of SNF placement at time of discharge. Patient reported that patient's daughter, Sabrina Glass  is currently unable to care for patient at their home given patient's current physical needs and fall risk. Patient expressed understanding of PT recommendation and is agreeable to SNF placement at time of discharge, short term. Patient reports preference for [facility] per daughter's recommendation. NCM discussed insurance authorization process and provided Medicare SNF ratings list. Patient expressed being hopeful for rehab and to feel better soon. No further questions reported at this time. NCM to continue to follow and assist with discharge planning needs. Referral for SNF/ Rehab. faxed to [facility] and [facility] @ [phone].  Pt insurance is not managed by [facility]. SNF will have to initiate SNF authorization once bed offer received and accepted.   TOC team will continue to monitor ...    07/06/2019 @ 10:34 am NCM received call from [facility] admission. Liaison stated they are reviewing pt for bed offer and will f/u with NCM once determination made.   07/06/2019 @ 11:54 am Bed offer extended with [facility] and accepted. Sabrina Glass Heritage manager to began English as a second language teacher.  Expected Discharge Plan: Skilled Nursing Facility Barriers to Discharge: Continued Medical Work up   Patient Goals and CMS Choice        Expected Discharge Plan and  Services Expected Discharge Plan: Skilled Nursing Facility                                              Prior Living Arrangements/Services                       Activities of Daily Living Home Assistive Devices/Equipment: None ADL Screening (condition at time of admission) Patient's cognitive ability adequate to safely complete daily activities?: Yes Is the patient deaf or have difficulty hearing?: No Does the patient have difficulty seeing, even when wearing glasses/contacts?: Yes Does the patient have difficulty concentrating, remembering, or making decisions?: No Patient able to express need for assistance with ADLs?: Yes Does the patient have difficulty dressing or bathing?: No Independently performs ADLs?: Yes (appropriate for developmental age) Does the patient have difficulty walking or climbing stairs?: Yes Weakness of Legs: Both Weakness of Arms/Hands: None  Permission Sought/Granted                  Emotional Assessment              Admission diagnosis:  Hip fracture (HCC) [S72.009A] Patient Active Problem List   Diagnosis Date Noted  . Essential hypertension 07/04/2019  . Anemia 07/04/2019  . Pressure injury of skin 07/04/2019  . Closed left hip fracture, initial encounter (HCC) 07/03/2019  . Hip fracture (HCC) 07/03/2019   PCP:  [facility], [phone]  Pharmacy:   [facility] [address] [place] [address] Phone: [phone] Fax: [phone]     Social Determinants of Health (SDOH) Interventions    Readmission Risk Interventions No flowsheet data found.

## 2019-07-07 ENCOUNTER — Encounter (HOSPITAL_COMMUNITY): Payer: Self-pay | Admitting: Orthopedic Surgery

## 2019-07-07 LAB — CBC
HCT: 23.5 % — ABNORMAL LOW (ref 36.0–46.0)
Hemoglobin: 7.1 g/dL — ABNORMAL LOW (ref 12.0–15.0)
MCH: 26.3 pg (ref 26.0–34.0)
MCHC: 30.2 g/dL (ref 30.0–36.0)
MCV: 87 fL (ref 80.0–100.0)
Platelets: 289 10*3/uL (ref 150–400)
RBC: 2.7 MIL/uL — ABNORMAL LOW (ref 3.87–5.11)
RDW: 14.6 % (ref 11.5–15.5)
WBC: 10.3 10*3/uL (ref 4.0–10.5)
nRBC: 0 % (ref 0.0–0.2)

## 2019-07-07 LAB — BASIC METABOLIC PANEL
Anion gap: 9 (ref 5–15)
BUN: 13 mg/dL (ref 8–23)
CO2: 27 mmol/L (ref 22–32)
Calcium: 7.9 mg/dL — ABNORMAL LOW (ref 8.9–10.3)
Chloride: 99 mmol/L (ref 98–111)
Creatinine, Ser: 0.7 mg/dL (ref 0.44–1.00)
GFR calc Af Amer: >60 mL/min (ref >60)
GFR calc non Af Amer: >60 mL/min (ref >60)
Glucose, Bld: 121 mg/dL — ABNORMAL HIGH (ref 70–99)
Potassium: 3.8 mmol/L (ref 3.5–5.1)
Sodium: 135 mmol/L (ref 135–145)

## 2019-07-07 LAB — SARS CORONAVIRUS 2 BY RT PCR (HOSPITAL ORDER, PERFORMED IN ~~LOC~~ HOSPITAL LAB): SARS Coronavirus 2: NEGATIVE

## 2019-07-07 LAB — HEMOGLOBIN AND HEMATOCRIT, BLOOD
HCT: 26.7 % — ABNORMAL LOW (ref 36.0–46.0)
Hemoglobin: 8.5 g/dL — ABNORMAL LOW (ref 12.0–15.0)

## 2019-07-07 LAB — PREPARE RBC (CROSSMATCH)

## 2019-07-07 MED ORDER — HYDROCODONE-ACETAMINOPHEN 5-325 MG PO TABS: 1.0000 | ORAL_TABLET | ORAL | 0 refills | Status: DC | PRN

## 2019-07-07 MED ORDER — ASPIRIN 81 MG PO CHEW: 81.0000 mg | CHEWABLE_TABLET | Freq: Two times a day (BID) | ORAL | 0 refills | Status: AC

## 2019-07-07 MED ORDER — ADULT MULTIVITAMIN W/MINERALS CH
1.0000 | ORAL_TABLET | Freq: Every day | ORAL | Status: DC
  Administered 2019-07-07 – 2019-07-17 (×11): 1 via ORAL
  Filled 2019-07-07 (×11): qty 1

## 2019-07-07 MED ORDER — ASPIRIN 81 MG PO CHEW
81.0000 mg | CHEWABLE_TABLET | Freq: Two times a day (BID) | ORAL | Status: DC
  Administered 2019-07-07 – 2019-07-17 (×19): 81 mg via ORAL
  Filled 2019-07-07 (×19): qty 1

## 2019-07-07 MED ORDER — SODIUM CHLORIDE 0.9% IV SOLUTION: Freq: Once | INTRAVENOUS | Status: AC

## 2019-07-07 NOTE — Progress Notes (Signed)
Patient making frequent request overnight. Patient given PRN pain medication during the night, however has the tendency to move a lot during the night causing more pain overnight.  Will continue to monitor

## 2019-07-07 NOTE — TOC Progression Note (Signed)
Transition of Care [facility]) - Progression Note    Patient Details  Name: Sabrina Glass MRN: [id number] Date of Birth: Jul 05, 1927  Transition of Care [facility]) CM/SW Contact  Epifanio Lesches, RN Phone Number: [phone] 07/07/2019, 9:59 AM  Clinical Narrative:    Pt medically ready for d/c. Awaiting insurance authorization for SNF placement, [facility] [facility]. Updated COVID needed. MD and nurse made aware. TOC team will continue to monitor for needs...   Expected Discharge Plan: Skilled Nursing Facility ([facility]) Barriers to Discharge: English as a second language teacher  Expected Discharge Plan and Services Expected Discharge Plan: Skilled Nursing Facility ([facility] [place])                                               Social Determinants of Health (SDOH) Interventions    Readmission Risk Interventions No flowsheet data found.

## 2019-07-07 NOTE — Plan of Care (Signed)
  Problem: Education: Goal: Knowledge of General Education information will improve Description: Including pain rating scale, medication(s)/side effects and non-pharmacologic comfort measures Outcome: Progressing   Problem: Clinical Measurements: Goal: Respiratory complications will improve Outcome: Progressing   Problem: Nutrition: Goal: Adequate nutrition will be maintained Outcome: Progressing   Problem: Elimination: Goal: Will not experience complications related to urinary retention Outcome: Progressing   Problem: Safety: Goal: Ability to remain free from injury will improve Outcome: Progressing

## 2019-07-07 NOTE — Progress Notes (Signed)
PROGRESS NOTE    Sabrina Glass  BOF:[id number] DOB: 1927/03/08 DOA: 07/03/2019 PCP: Patient, No Pcp Per   Brief Narrative:   Sabrina Glass is a 86 y.o. female with history of hypertension was brought to the ER at [facility] after patient had a fall. she slipped and fell when her leg gave way.  Did not hit her head or lose consciousness. x-rays in the ER showed left hip fracture and patient was transferred to [facility] for further management.  Labs over that showed anemia with hemoglobin around 9 mild leukocytosis chest x-ray showing chronic findings.  Patient underwent left hip arthroplasty on 07/05/2019.  Assessment & Plan:   Principal Problem:   Closed left hip fracture, initial encounter [facility]) Active Problems:   Hip fracture (HCC)   Essential hypertension   Anemia   Pressure injury of skin  1. Left hip fracture status post mechanical fall: Status post total left hip arthroplasty, anterior approach by Dr. Linna Caprice on 07/05/2019. Pain is controlled. Management per orthopedics. 2. Essential hypertension: Blood pressure controlled.  Continue lisinopril and as needed hydralazine. 3. Acute blood loss anemia: Hemoglobin dropped from 9.7>7.5> 7.1 today.  She is stable without any symptoms. Will transfuse 1 unit of PRBC and check hemoglobin posttransfusion. 4. Leukocytosis could be reactionary patient is afebrile.  Closely monitor. This is resolved. 5. Mild hypokalemia: Resolved..  DVT prophylaxis: SCDs Start: 07/05/19 1054 and aspirin 81 p.o. twice daily per orthopedics   Code Status: Full Code  Family Communication: None present at bedside.  Plan of care discussed with patient in length and he verbalized understanding and agreed with it.  Status is: Inpatient  Remains inpatient appropriate because:Inpatient level of care appropriate due to severity of illness   Dispo: The patient is from: Home              Anticipated d/c is to: SNF               Anticipated d/c date is: 07/07/2019 if authorization obtained otherwise 07/07/2019              Patient currently is medically stable to d/c.        Estimated body mass index is 20.2 kg/m as calculated from the following:   Height as of this encounter: 5\' 1"  (1.549 m).   Weight as of this encounter: 48.5 kg.  Pressure Injury 07/03/19 Coccyx Medial;Lower Stage 2 -  Partial thickness loss of dermis presenting as a shallow open injury with a red, pink wound bed without slough. (Active)  07/03/19 2154  Location: Coccyx  Location Orientation: Medial;Lower  Staging: Stage 2 -  Partial thickness loss of dermis presenting as a shallow open injury with a red, pink wound bed without slough.  Wound Description (Comments):   Present on Admission: Yes     Nutritional status:  Nutrition Problem: Increased nutrient needs Etiology: hip fracture, wound healing (Left hip fracture pending surgical intervention, stage II PI present on admission)   Signs/Symptoms: estimated needs   Interventions: Ensure Enlive (each supplement provides 350kcal and 20 grams of protein), Refer to RD note for recommendations    Consultants:   Orthopedics  Procedures:   Left total hip arthroplasty  Antimicrobials:  Anti-infectives (From admission, onward)   Start     Dose/Rate Route Frequency Ordered Stop   07/05/19 1100  ceFAZolin (ANCEF) IVPB 2g/100 mL premix        2 g 200 mL/hr over 30 Minutes Intravenous Every 6  hours 07/05/19 1053 07/05/19 1754   07/04/19 0730  ceFAZolin (ANCEF) IVPB 2g/100 mL premix  Status:  Discontinued        2 g 200 mL/hr over 30 Minutes Intravenous On call to O.R. 07/04/19 0720 07/05/19 0559   07/04/19 0730  ceFAZolin (ANCEF) 3 g in dextrose 5 % 50 mL IVPB  Status:  Discontinued        3 g 100 mL/hr over 30 Minutes Intravenous On call to O.R. 07/04/19 0720 07/04/19 0723         Subjective: Patient seen and examined. She has no complaints.  Objective: Vitals:    07/07/19 0342 07/07/19 0724 07/07/19 1143 07/07/19 1216  BP: (!) 144/64 139/63 (!) 141/69 (!) 157/76  Pulse: 87 89 93 98  Resp: 14 16 18 18   Temp: 98.7 F (37.1 C) 98.4 F (36.9 C) 99.1 F (37.3 C) 98.5 F (36.9 C)  TempSrc: Oral Oral Oral Oral  SpO2: 94% 94% 96% 95%  Weight:      Height:        Intake/Output Summary (Last 24 hours) at 07/07/2019 1306 Last data filed at 07/07/2019 0900 Gross per 24 hour  Intake 240 ml  Output --  Net 240 ml   Filed Weights   07/04/19 0902  Weight: 48.5 kg    Examination:  General exam: Appears calm and comfortable  Respiratory system: Clear to auscultation. Respiratory effort normal. Cardiovascular system: S1 & S2 heard, RRR. No JVD, murmurs, rubs, gallops or clicks. No pedal edema. Gastrointestinal system: Abdomen is nondistended, soft and nontender. No organomegaly or masses felt. Normal bowel sounds heard. Central nervous system: Alert and oriented. No focal neurological deficits. Skin: No rashes, lesions or ulcers.  Psychiatry: Judgement and insight appear normal. Mood & affect appropriate.   Data Reviewed: I have personally reviewed following labs and imaging studies  CBC: Recent Labs  Lab 07/04/19 0054 07/06/19 0203 07/07/19 0309  WBC 13.2* 12.7* 10.3  NEUTROABS 9.2*  --   --   HGB 9.7* 7.5* 7.1*  HCT 32.0* 24.0* 23.5*  MCV 87.2 87.0 87.0  PLT 411* 345 289   Basic Metabolic Panel: Recent Labs  Lab 07/04/19 0054 07/04/19 1134 07/06/19 0203 07/07/19 0309  NA 137 136 134* 135  K 3.2* 3.4* 3.8 3.8  CL 100 100 98 99  CO2 27 28 24 27   GLUCOSE 116* 108* 111* 121*  BUN 6* 8 19 13   CREATININE 0.61 0.63 0.82 0.70  CALCIUM 8.6* 8.1* 8.0* 7.9*   GFR: Estimated Creatinine Clearance: 35.3 mL/min (by C-G formula based on SCr of 0.7 mg/dL). Liver Function Tests: Recent Labs  Lab 07/04/19 0054  AST 23  ALT 18  ALKPHOS 85  BILITOT 0.5  PROT 7.1  ALBUMIN 2.8*   No results for input(s): LIPASE, AMYLASE in the last 168  hours. No results for input(s): AMMONIA in the last 168 hours. Coagulation Profile: No results for input(s): INR, PROTIME in the last 168 hours. Cardiac Enzymes: No results for input(s): CKTOTAL, CKMB, CKMBINDEX, TROPONINI in the last 168 hours. BNP (last 3 results) No results for input(s): PROBNP in the last 8760 hours. HbA1C: No results for input(s): HGBA1C in the last 72 hours. CBG: No results for input(s): GLUCAP in the last 168 hours. Lipid Profile: No results for input(s): CHOL, HDL, LDLCALC, TRIG, CHOLHDL, LDLDIRECT in the last 72 hours. Thyroid Function Tests: No results for input(s): TSH, T4TOTAL, FREET4, T3FREE, THYROIDAB in the last 72 hours. Anemia Panel: No results for  input(s): VITAMINB12, FOLATE, FERRITIN, TIBC, IRON, RETICCTPCT in the last 72 hours. Sepsis Labs: No results for input(s): PROCALCITON, LATICACIDVEN in the last 168 hours.  Recent Results (from the past 240 hour(s))  SARS Coronavirus 2 by RT PCR (hospital order, performed in [facility] hospital lab) Nasopharyngeal Nasopharyngeal Swab     Status: None   Collection Time: 07/04/19  5:03 AM   Specimen: Nasopharyngeal Swab  Result Value Ref Range Status   SARS Coronavirus 2 NEGATIVE NEGATIVE Final    Comment: (NOTE) SARS-CoV-2 target nucleic acids are NOT DETECTED.  The SARS-CoV-2 RNA is generally detectable in upper and lower respiratory specimens during the acute phase of infection. The lowest concentration of SARS-CoV-2 viral copies this assay can detect is 250 copies / mL. A negative result does not preclude SARS-CoV-2 infection and should not be used as the sole basis for treatment or other patient management decisions.  A negative result may occur with improper specimen collection / handling, submission of specimen other than nasopharyngeal swab, presence of viral mutation(s) within the areas targeted by this assay, and inadequate number of viral copies (<250 copies / mL). A negative result must be  combined with clinical observations, patient history, and epidemiological information.  Fact Sheet for Patients:   StrictlyIdeas.no  Fact Sheet for Healthcare Providers: BankingDealers.co.za  This test is not yet approved or  cleared by the [place] FDA and has been authorized for detection and/or diagnosis of SARS-CoV-2 by FDA under an Emergency Use Authorization (EUA).  This EUA will remain in effect (meaning this test can be used) for the duration of the COVID-19 declaration under Section 564(b)(1) of the Act, 21 U.S.C. section 360bbb-3(b)(1), unless the authorization is terminated or revoked sooner.  Performed at [facility], [place] [address], [place], [place] [address]   Surgical pcr screen     Status: None   Collection Time: 07/04/19 11:14 AM   Specimen: Nasal Mucosa; Nasal Swab  Result Value Ref Range Status   MRSA, PCR NEGATIVE NEGATIVE Final   Staphylococcus aureus NEGATIVE NEGATIVE Final    Comment: (NOTE) The Xpert SA Assay (FDA approved for NASAL specimens in patients 85 years of age and older), is one component of a comprehensive surveillance program. It is not intended to diagnose infection nor to guide or monitor treatment. Performed at [facility], [place] [address] [place], [place] [address]       Radiology Studies: CT LUMBAR SPINE WO CONTRAST  Result Date: 07/05/2019 CLINICAL DATA:  86 year old female with low back pain. Recent fall with hip fracture and ORIF left hip. EXAM: CT LUMBAR SPINE WITHOUT CONTRAST TECHNIQUE: Multidetector CT imaging of the lumbar spine was performed without intravenous contrast administration. Multiplanar CT image reconstructions were also generated. COMPARISON:  Hip and pelvis radiographs today. FINDINGS: Segmentation: Normal assuming hypoplastic ribs at T12, full size ribs on the left at T11 (hypoplastic right T11 rib). Alignment: Moderate scoliosis mostly levoconvex in  the lumbar spine. Subsequent straightening of lumbar lordosis. Subtle anterolisthesis of L2 on L3. Vertebrae: Severe T12 compression fracture, although appears likely to be chronic. No acute endplate fracture lucency is evident. The visible T11 level appears intact. Lumbar vertebrae appear intact. Osteopenia. No sacral fracture is identified. SI joints appear intact. Paraspinal and other soft tissues: Extensive Calcified aortic atherosclerosis. Partially visible gastric hiatal hernia. Simple fluid density left renal upper pole cyst. Mild ectasia of the abdominal aorta. Negative visible other noncontrast abdominal viscera. Lumbar paraspinal soft tissues appear negative. Disc  levels: Intermittent severe lumbar disc and endplate degeneration, sparing L2-L3 and L5-S1. Vacuum disc and severe disc space loss elsewhere. Subsequent multifactorial mild to moderate spinal stenosis at L3-L4. Up to mild lumbar spinal stenosis elsewhere. IMPRESSION: 1. Osteopenia. Severe T12 compression fracture although most likely chronic. No acute osseous abnormality identified in the lumbar spine or visible sacrum. Lumbar MRI (noncontrast) or Nuclear Medicine Whole-body Bone Scan would confirm as necessary. 2. Moderate lumbar scoliosis with advanced disc and endplate degeneration. Up to moderate degenerative spinal stenosis at L3-L4. 3. Aortic Atherosclerosis (ICD10-I70.0).  Hiatal hernia. Electronically Signed   By: Odessa Fleming M.D.   On: 07/05/2019 17:13   MR LUMBAR SPINE WO CONTRAST  Result Date: 07/06/2019 CLINICAL DATA:  Low back pain, increased fracture risk. Recent hip replacement. EXAM: MRI LUMBAR SPINE WITHOUT CONTRAST TECHNIQUE: Multiplanar, multisequence MR imaging of the lumbar spine was performed. No intravenous contrast was administered. COMPARISON:  CT lumbar spine 07/05/2019 FINDINGS: Segmentation:  Normal Alignment: Moderate to severe lumbar scoliosis. Normal sagittal alignment Vertebrae: Negative for acute fracture or mass.  Severe chronic compression fracture T12 vertebral body without bone marrow edema. No change from recent CT. Conus medullaris and cauda equina: Conus extends to the L1-2 level. Conus and cauda equina appear normal. Paraspinal and other soft tissues: Negative for paraspinous mass or adenopathy. Bilateral renal cysts. Disc levels: T12-L1: Negative for stenosis.  Mild disc and facet degeneration L1-2: Moderate disc degeneration and spurring. Mild spinal stenosis and mild facet degeneration bilaterally. L2-3: Mild disc bulging. Small left sided disc protrusion without neural impingement. Moderate facet hypertrophy bilaterally. Mild spinal stenosis. L3-4: Asymmetric disc degeneration and spurring on the right. Moderate subarticular stenosis on the right with expected impingement of the right L4 nerve root. Mild spinal stenosis L4-5: Disc degeneration with disc space narrowing and endplate spurring. Moderate to severe subarticular stenosis on the left. Bilateral facet degeneration. Spinal canal adequate in size L5-S1: Mild disc degeneration. Moderate facet degeneration. Mild subarticular stenosis on the right. IMPRESSION: Negative for acute fracture. Severe chronic compression fracture T12 Moderate to severe scoliosis. Multilevel degenerative changes above. Electronically Signed   By: Marlan Palau M.D.   On: 07/06/2019 19:22    Scheduled Meds: . aspirin  81 mg Oral BID WC  . docusate sodium  100 mg Oral BID  . feeding supplement (ENSURE ENLIVE)  237 mL Oral Q24H  . lisinopril  20 mg Oral Daily  . pantoprazole  40 mg Oral Daily   Continuous Infusions:    LOS: 4 days   Time spent: 26 minutes   Hughie Closs, MD [facility]  07/07/2019, 1:06 PM   To contact the attending provider between 7A-7P or the covering provider during after hours 7P-7A, please log into the web site [email or web address].

## 2019-07-07 NOTE — Plan of Care (Signed)

## 2019-07-07 NOTE — Progress Notes (Addendum)
Nutrition Follow-up  DOCUMENTATION CODES:   Not applicable  INTERVENTION:   -Continue Ensure Enlive po daily, each supplement provides 350 kcal and 20 grams of protein -Magic cup BID with meals, each supplement provides 290 kcal and 9 grams of protein -MVI with minerals daily  NUTRITION DIAGNOSIS:   Increased nutrient needs related to hip fracture, wound healing (Left hip fracture pending surgical intervention, stage II PI present on admission) as evidenced by estimated needs.  Ongoing  GOAL:   Patient will meet greater than or equal to 90% of their needs  Progressing  MONITOR:   Labs, Supplement acceptance, PO intake, Weight trends  REASON FOR ASSESSMENT:   Consult Hip fracture protocol  ASSESSMENT:   86 year old female with past medical history of HTN and anemia transferred from [facility] [place] for further management of left hip fracture after a fall at home.  6/13- s/p PROCEDURE: Left total hip arthroplasty, anterior approach.  Reviewed I/O's: +240 ml x 24 hours and +417 ml since [employer] with pt at bedside, who was pleasant and in good spirits today. She reports decreased appetite, however, this has improved since admission. Per documented meal completion records, PO: 25-50%. Pt shares she ate most of her breakfast today and did well with softer textured food selections. Pt reports that she eats a mechanical soft diet at home, due to lakc of teeth (pt shares she has ill fitting dentures at home). Offered to downgrade diet, however, pt prefers to have largest selection of menu items that she can choose from.   Pt reports that she has lost "a lot of weight over the years", however, unsure what her UBW is or when the weight loss started.   Discussed importance of good meal and supplement intake to promote healing.   Per TOC notes, plan to d/c to SNF once bed is available.   Medications reviewed and include colace and lovenox.   Labs reviewed.    Nutrition-Focused Physical Exam   Most Recent Value  Orbital Region No depletion  Upper Arm Region Mild depletion  Thoracic and Lumbar Region No depletion  Buccal Region No depletion  Temple Region No depletion  Clavicle Bone Region Mild depletion  Clavicle and Acromion Bone Region No depletion  Scapular Bone Region No depletion  Dorsal Hand Mild depletion  Patellar Region No depletion  Anterior Thigh Region No depletion  Posterior Calf Region No depletion  Edema (RD Assessment) None  Hair Reviewed  Eyes Reviewed  Mouth Reviewed  Skin Reviewed  Nails Reviewed      Diet Order:   Diet Order            Diet regular Room service appropriate? Yes; Fluid consistency: Thin  Diet effective now                 EDUCATION NEEDS:   No education needs have been identified at this time  Skin:  Skin Assessment: Skin Integrity Issues: Skin Integrity Issues:: Incisions, Stage II Stage II: coccyx Incisions: lt hip  Last BM:  07/06/19  Height:   Ht Readings from Last 1 Encounters:  07/04/19 5\' 1"  (1.549 m)    Weight:   Wt Readings from Last 1 Encounters:  07/04/19 48.5 kg    Ideal Body Weight:  47.7 kg  BMI:  Body mass index is 20.2 kg/m.  Estimated Nutritional Needs:   Kcal:  1450-1650  Protein:  65-80 grams  Fluid:  > 1.4 L    Kjersten Ormiston  W, RD, LDN, [place] Registered Dietitian II Certified Diabetes Care and Education Specialist Please refer to [facility] for RD and/or RD on-call/weekend/after hours pager

## 2019-07-07 NOTE — Progress Notes (Signed)
    Subjective:  Patient reports pain as mild.  Denies N/V/CP/SOB. No c/o. States LBP is improving. Receiving PRBCs currently  Objective:   VITALS:   Vitals:   07/07/19 0342 07/07/19 0724 07/07/19 1143 07/07/19 1216  BP: (!) 144/64 139/63 (!) 141/69 (!) 157/76  Pulse: 87 89 93 98  Resp: 14 16 18 18   Temp: 98.7 F (37.1 C) 98.4 F (36.9 C) 99.1 F (37.3 C) 98.5 F (36.9 C)  TempSrc: Oral Oral Oral Oral  SpO2: 94% 94% 96% 95%  Weight:      Height:        NAD ABD soft Sensation intact distally Intact pulses distally Dorsiflexion/Plantar flexion intact Incision: dressing C/D/I Compartment soft   Lab Results  Component Value Date   WBC 10.3 07/07/2019   HGB 7.1 (L) 07/07/2019   HCT 23.5 (L) 07/07/2019   MCV 87.0 07/07/2019   PLT 289 07/07/2019   BMET    Component Value Date/Time   NA 135 07/07/2019 0309   K 3.8 07/07/2019 0309   CL 99 07/07/2019 0309   CO2 27 07/07/2019 0309   GLUCOSE 121 (H) 07/07/2019 0309   BUN 13 07/07/2019 0309   CREATININE 0.70 07/07/2019 0309   CALCIUM 7.9 (L) 07/07/2019 0309   GFRNONAA >60 07/07/2019 0309   GFRAA >60 07/07/2019 0309     Assessment/Plan: 2 Days Post-Op   Principal Problem:   Closed left hip fracture, initial encounter (HCC) Active Problems:   Hip fracture (HCC)   Essential hypertension   Anemia   Pressure injury of skin   WBAT with walker DVT ppx: ASA 81 mg PO BID for 6 weeks, SCDs, TEDS PO pain control PT/OT Dispo: d/c planning    07/09/2019 Kelcy Laible 07/07/2019, 12:25 PM   07/09/2019, MD [phone] [facility] is now [facility]  [place] [address], [address], [place], [place] Phone: [phone] [email or web address] Facebook  [phone]

## 2019-07-08 LAB — BASIC METABOLIC PANEL
Anion gap: 11 (ref 5–15)
BUN: 12 mg/dL (ref 8–23)
CO2: 26 mmol/L (ref 22–32)
Calcium: 8.4 mg/dL — ABNORMAL LOW (ref 8.9–10.3)
Chloride: 101 mmol/L (ref 98–111)
Creatinine, Ser: 0.74 mg/dL (ref 0.44–1.00)
GFR calc Af Amer: >60 mL/min (ref >60)
GFR calc non Af Amer: >60 mL/min (ref >60)
Glucose, Bld: 133 mg/dL — ABNORMAL HIGH (ref 70–99)
Potassium: 4.1 mmol/L (ref 3.5–5.1)
Sodium: 138 mmol/L (ref 135–145)

## 2019-07-08 LAB — TYPE AND SCREEN
ABO/RH(D): O POS
Antibody Screen: NEGATIVE
Unit division: 0

## 2019-07-08 LAB — CBC
HCT: 31.1 % — ABNORMAL LOW (ref 36.0–46.0)
Hemoglobin: 9.7 g/dL — ABNORMAL LOW (ref 12.0–15.0)
MCH: 26.9 pg (ref 26.0–34.0)
MCHC: 31.2 g/dL (ref 30.0–36.0)
MCV: 86.4 fL (ref 80.0–100.0)
Platelets: 418 10*3/uL — ABNORMAL HIGH (ref 150–400)
RBC: 3.6 MIL/uL — ABNORMAL LOW (ref 3.87–5.11)
RDW: 14.7 % (ref 11.5–15.5)
WBC: 12.5 10*3/uL — ABNORMAL HIGH (ref 4.0–10.5)
nRBC: 0 % (ref 0.0–0.2)

## 2019-07-08 LAB — BPAM RBC
Blood Product Expiration Date: 202107122359
ISSUE DATE / TIME: 202106151152
Unit Type and Rh: 5100

## 2019-07-08 MED ORDER — ALPRAZOLAM 0.5 MG PO TABS: 0.5000 mg | ORAL_TABLET | Freq: Every day | ORAL | 0 refills | Status: DC | PRN

## 2019-07-08 NOTE — Plan of Care (Signed)
  Problem: Education: Goal: Knowledge of General Education information will improve Description: Including pain rating scale, medication(s)/side effects and non-pharmacologic comfort measures Outcome: Progressing   Problem: Clinical Measurements: Goal: Respiratory complications will improve Outcome: Progressing Note: Room air   Problem: Activity: Goal: Risk for activity intolerance will decrease Outcome: Progressing Note: Up to bathroom and chair multiple times today with 1 assist and a walker , tolerating well   Problem: Nutrition: Goal: Adequate nutrition will be maintained Outcome: Progressing   Problem: Coping: Goal: Level of anxiety will decrease Outcome: Progressing   Problem: Elimination: Goal: Will not experience complications related to bowel motility Outcome: Progressing Note: Multiple BM's this shift   Problem: Pain Managment: Goal: General experience of comfort will improve Outcome: Progressing Note: Treated for left hip pain once today with vicodin   Problem: Safety: Goal: Ability to remain free from injury will improve Outcome: Progressing

## 2019-07-08 NOTE — TOC CAGE-AID Note (Signed)
Transition of Care [facility]) - CAGE-AID Screening   Patient Details  Name: Sabrina Glass MRN: [id number] Date of Birth: [birth year]  Transition of Care [facility]) CM/SW Contact:    Jimmy Picket, LCSWA Phone Number: 07/08/2019, 1:48 PM   Clinical Narrative:  Pt denies alcohol use and substance use.  CAGE-AID Screening:    Have You Ever Felt You Ought to Cut Down on Your Drinking or Drug Use?: No Have People Annoyed You By Critizing Your Drinking Or Drug Use?: No Have You Felt Bad Or Guilty About Your Drinking Or Drug Use?: No Have You Ever Had a Drink or Used Drugs First Thing In The Morning to STeady Your Nerves or to Get Rid of a Hangover?: No CAGE-AID Score: 0  Substance Abuse Education Offered: No     Denita Lung, Bridget Hartshorn Clinical Social Worker [phone]

## 2019-07-08 NOTE — Progress Notes (Addendum)
Physical Therapy Treatment Patient Details Name: Sabrina Glass MRN: [id number] DOB: 10/22/1927 Today's Date: 07/08/2019    History of Present Illness Patient is a 86 y/o female who presents with left hip fx s/p fall now s/p left THA 07/05/19. PMH includes HTN.    PT Comments    Pt supine in bed on arrival, she was able to come to sitting and progress to short bout of gt training with min-mod A.  Continue to recommend snf to maximize functional gains before returning home.     Follow Up Recommendations  SNF;Supervision for mobility/OOB     Equipment Recommendations  None recommended by PT    Recommendations for Other Services       Precautions / Restrictions Precautions Precautions: Fall Restrictions Weight Bearing Restrictions: Yes LLE Weight Bearing: Weight bearing as tolerated    Mobility  Bed Mobility Overal bed mobility: Needs Assistance Bed Mobility: Supine to Sit     Supine to sit: Mod assist     General bed mobility comments: Assistance for LE advancement and trunk elebvation.  Pt with rounded spine and posterior pelvic tilt.  Transfers Overall transfer level: Needs assistance Equipment used: Rolling walker (2 wheeled) Transfers: Sit to/from Stand Sit to Stand: Mod assist         General transfer comment: Assist to power to standing with cues for hand placement; stood from EOB, min assistance for balance.  Ambulation/Gait Ambulation/Gait assistance: Min assist Gait Distance (Feet): 18 Feet Assistive device: Rolling walker (2 wheeled) Gait Pattern/deviations: Step-through pattern;Antalgic;Trunk flexed;Decreased stride length;Decreased step length - right;Decreased step length - left     General Gait Details: Pt required assistance to maintain pathway of RW, pt present with kyphotic posturing.  Cues for sequencing and forward gaze.   Stairs             Wheelchair Mobility    Modified Rankin (Stroke Patients Only)       Balance Overall  balance assessment: Needs assistance Sitting-balance support: Feet supported;No upper extremity supported Sitting balance-Leahy Scale: Fair       Standing balance-Leahy Scale: Poor                              Cognition Arousal/Alertness: Awake/alert Behavior During Therapy: WFL for tasks assessed/performed Overall Cognitive Status: No family/caregiver present to determine baseline cognitive functioning Area of Impairment: Memory                     Memory: Decreased short-term memory         General Comments: Pt tangential with speech and not directly answering questions.      Exercises General Exercises - Lower Extremity Ankle Circles/Pumps: AROM;Both;10 reps;Supine Quad Sets: AROM;Both;10 reps;Supine Heel Slides: AROM;AAROM;Both;10 reps;Supine Hip ABduction/ADduction: AROM;AAROM;Both;10 reps;Supine    General Comments        Pertinent Vitals/Pain Pain Assessment: Faces Faces Pain Scale: Hurts even more Pain Location: left hip with movement Pain Descriptors / Indicators: Operative site guarding;Sore Pain Intervention(s): Monitored during session;Repositioned    Home Living                      Prior Function            PT Goals (current goals can now be found in the care plan section) Acute Rehab PT Goals Patient Stated Goal: to get up and move Potential to Achieve Goals: Good Progress towards PT goals: Progressing toward goals  Frequency    Min 3X/week      PT Plan Current plan remains appropriate    Co-evaluation              AM-PAC PT "6 Clicks" Mobility   Outcome Measure  Help needed turning from your back to your side while in a flat bed without using bedrails?: A Little Help needed moving from lying on your back to sitting on the side of a flat bed without using bedrails?: A Lot Help needed moving to and from a bed to a chair (including a wheelchair)?: A Lot Help needed standing up from a chair using  your arms (e.g., wheelchair or bedside chair)?: A Lot Help needed to walk in hospital room?: A Lot Help needed climbing 3-5 steps with a railing? : Total 6 Click Score: 12    End of Session Equipment Utilized During Treatment: Gait belt Activity Tolerance: Patient tolerated treatment well Patient left: in chair;with call bell/phone within reach;with chair alarm set Nurse Communication: Mobility status PT Visit Diagnosis: Pain;Muscle weakness (generalized) (M62.81);Unsteadiness on feet (R26.81);Difficulty in walking, not elsewhere classified (R26.2) Pain - Right/Left: Left Pain - part of body: Hip     Time: 3382-5053 PT Time Calculation (min) (ACUTE ONLY): 21 min  Charges:  $Gait Training: 8-22 mins                    Bonney Leitz , PTA Acute Rehabilitation Services Pager [phone] Office [phone]     Citlaly Camplin Artis Delay 07/08/2019, 5:44 PM

## 2019-07-08 NOTE — Progress Notes (Signed)
PROGRESS NOTE    Sabrina Glass  AST:[id number] DOB: Jun 10, 1927 DOA: 07/03/2019 PCP: Patient, No Pcp Per   Brief Narrative:   Sabrina Glass is a 86 y.o. female with history of hypertension was brought to the ER at [facility] after patient had a fall. she slipped and fell when her leg gave way.  Did not hit her head or lose consciousness. x-rays in the ER showed left hip fracture and patient was transferred to [facility] for further management.  Labs over that showed anemia with hemoglobin around 9 mild leukocytosis chest x-ray showing chronic findings.  Patient underwent left hip arthroplasty on 07/05/2019.  Assessment & Plan:   Principal Problem:   Closed left hip fracture, initial encounter [facility]) Active Problems:   Hip fracture (HCC)   Essential hypertension   Anemia   Pressure injury of skin  1. Left hip fracture status post mechanical fall: Status post total left hip arthroplasty, anterior approach by Dr. Linna Caprice on 07/05/2019. Pain is controlled. Management per orthopedics. 2. Essential hypertension: Blood pressure controlled.  Continue lisinopril and as needed hydralazine. 3. Acute blood loss anemia: Hemoglobin dropped from 9.7>7.5> 7.1 07/07/2019.  Received 1 unit of PRBC transfusion.  Posttransfusion hemoglobin today is 9.7.  She is stable without any symptoms.  4. Leukocytosis could be reactionary patient is afebrile.  Closely monitor.  This had resolved but now she has mild leukocytosis again but again she remains afebrile without any signs or symptoms of infection. 5. Mild hypokalemia: Resolved..  DVT prophylaxis: SCDs Start: 07/05/19 1054 and aspirin 81 p.o. twice daily per orthopedics   Code Status: Full Code  Family Communication: None present at bedside.  Plan of care discussed with patient in length and he verbalized understanding and agreed with it.  Status is: Inpatient  Remains inpatient appropriate because:Inpatient level of care  appropriate due to severity of illness   Dispo: The patient is from: Home              Anticipated d/c is to: SNF              Anticipated d/c date is: 07/08/2019 if authorization obtained otherwise 07/09/2019              Patient currently is medically stable to d/c.        Estimated body mass index is 20.2 kg/m as calculated from the following:   Height as of this encounter: 5\' 1"  (1.549 m).   Weight as of this encounter: 48.5 kg.      Nutritional status:  Nutrition Problem: Increased nutrient needs Etiology: hip fracture, wound healing (Left hip fracture pending surgical intervention, stage II PI present on admission)   Signs/Symptoms: estimated needs   Interventions: Ensure Enlive (each supplement provides 350kcal and 20 grams of protein), Refer to RD note for recommendations    Consultants:   Orthopedics  Procedures:   Left total hip arthroplasty  Antimicrobials:  Anti-infectives (From admission, onward)   Start     Dose/Rate Route Frequency Ordered Stop   07/05/19 1100  ceFAZolin (ANCEF) IVPB 2g/100 mL premix        2 g 200 mL/hr over 30 Minutes Intravenous Every 6 hours 07/05/19 1053 07/05/19 1754   07/04/19 0730  ceFAZolin (ANCEF) IVPB 2g/100 mL premix  Status:  Discontinued        2 g 200 mL/hr over 30 Minutes Intravenous On call to O.R. 07/04/19 0720 07/05/19 0559   07/04/19 0730  ceFAZolin (ANCEF)  3 g in dextrose 5 % 50 mL IVPB  Status:  Discontinued        3 g 100 mL/hr over 30 Minutes Intravenous On call to O.R. 07/04/19 0720 07/04/19 0723         Subjective: Seen and examined.  She says she is doing well.  No complaints.  Pain controlled.  Objective: Vitals:   07/07/19 1445 07/07/19 1931 07/08/19 0445 07/08/19 0837  BP: (!) 148/79 (!) 185/63 (!) 142/72 (!) 155/77  Pulse: 87 84 79 (!) 101  Resp: 17 16 16 16   Temp: 98.3 F (36.8 C) 98.4 F (36.9 C) 98.6 F (37 C) 98.8 F (37.1 C)  TempSrc: Oral Oral Oral Oral  SpO2: 98% 96% 97% 99%    Weight:      Height:        Intake/Output Summary (Last 24 hours) at 07/08/2019 1540 Last data filed at 07/08/2019 0603 Gross per 24 hour  Intake 120 ml  Output 650 ml  Net -530 ml   Filed Weights   07/04/19 0902  Weight: 48.5 kg    Examination:  General exam: Appears calm and comfortable  Respiratory system: Clear to auscultation. Respiratory effort normal. Cardiovascular system: S1 & S2 heard, RRR. No JVD, murmurs, rubs, gallops or clicks. No pedal edema. Gastrointestinal system: Abdomen is nondistended, soft and nontender. No organomegaly or masses felt. Normal bowel sounds heard. Central nervous system: Alert and oriented. No focal neurological deficits. Skin: No rashes, lesions or ulcers.  Psychiatry: Judgement and insight appear normal. Mood & affect appropriate.   Data Reviewed: I have personally reviewed following labs and imaging studies  CBC: Recent Labs  Lab 07/04/19 0054 07/06/19 0203 07/07/19 0309 07/07/19 1647 07/08/19 0819  WBC 13.2* 12.7* 10.3  --  12.5*  NEUTROABS 9.2*  --   --   --   --   HGB 9.7* 7.5* 7.1* 8.5* 9.7*  HCT 32.0* 24.0* 23.5* 26.7* 31.1*  MCV 87.2 87.0 87.0  --  86.4  PLT 411* 345 289  --  418*   Basic Metabolic Panel: Recent Labs  Lab 07/04/19 0054 07/04/19 1134 07/06/19 0203 07/07/19 0309 07/08/19 0819  NA 137 136 134* 135 138  K 3.2* 3.4* 3.8 3.8 4.1  CL 100 100 98 99 101  CO2 27 28 24 27 26   GLUCOSE 116* 108* 111* 121* 133*  BUN 6* 8 19 13 12   CREATININE 0.61 0.63 0.82 0.70 0.74  CALCIUM 8.6* 8.1* 8.0* 7.9* 8.4*   GFR: Estimated Creatinine Clearance: 35.3 mL/min (by C-G formula based on SCr of 0.74 mg/dL). Liver Function Tests: Recent Labs  Lab 07/04/19 0054  AST 23  ALT 18  ALKPHOS 85  BILITOT 0.5  PROT 7.1  ALBUMIN 2.8*   No results for input(s): LIPASE, AMYLASE in the last 168 hours. No results for input(s): AMMONIA in the last 168 hours. Coagulation Profile: No results for input(s): INR, PROTIME in the  last 168 hours. Cardiac Enzymes: No results for input(s): CKTOTAL, CKMB, CKMBINDEX, TROPONINI in the last 168 hours. BNP (last 3 results) No results for input(s): PROBNP in the last 8760 hours. HbA1C: No results for input(s): HGBA1C in the last 72 hours. CBG: No results for input(s): GLUCAP in the last 168 hours. Lipid Profile: No results for input(s): CHOL, HDL, LDLCALC, TRIG, CHOLHDL, LDLDIRECT in the last 72 hours. Thyroid Function Tests: No results for input(s): TSH, T4TOTAL, FREET4, T3FREE, THYROIDAB in the last 72 hours. Anemia Panel: No results for input(s): VITAMINB12,  FOLATE, FERRITIN, TIBC, IRON, RETICCTPCT in the last 72 hours. Sepsis Labs: No results for input(s): PROCALCITON, LATICACIDVEN in the last 168 hours.  Recent Results (from the past 240 hour(s))  SARS Coronavirus 2 by RT PCR (hospital order, performed in [facility] hospital lab) Nasopharyngeal Nasopharyngeal Swab     Status: None   Collection Time: 07/04/19  5:03 AM   Specimen: Nasopharyngeal Swab  Result Value Ref Range Status   SARS Coronavirus 2 NEGATIVE NEGATIVE Final    Comment: (NOTE) SARS-CoV-2 target nucleic acids are NOT DETECTED.  The SARS-CoV-2 RNA is generally detectable in upper and lower respiratory specimens during the acute phase of infection. The lowest concentration of SARS-CoV-2 viral copies this assay can detect is 250 copies / mL. A negative result does not preclude SARS-CoV-2 infection and should not be used as the sole basis for treatment or other patient management decisions.  A negative result may occur with improper specimen collection / handling, submission of specimen other than nasopharyngeal swab, presence of viral mutation(s) within the areas targeted by this assay, and inadequate number of viral copies (<250 copies / mL). A negative result must be combined with clinical observations, patient history, and epidemiological information.  Fact Sheet for Patients:     BoilerBrush.com.cy  Fact Sheet for Healthcare Providers: https://pope.com/  This test is not yet approved or  cleared by the [place] FDA and has been authorized for detection and/or diagnosis of SARS-CoV-2 by FDA under an Emergency Use Authorization (EUA).  This EUA will remain in effect (meaning this test can be used) for the duration of the COVID-19 declaration under Section 564(b)(1) of the Act, 21 U.S.C. section 360bbb-3(b)(1), unless the authorization is terminated or revoked sooner.  Performed at [facility], [address] [place], [place] [address]   Surgical pcr screen     Status: None   Collection Time: 07/04/19 11:14 AM   Specimen: Nasal Mucosa; Nasal Swab  Result Value Ref Range Status   MRSA, PCR NEGATIVE NEGATIVE Final   Staphylococcus aureus NEGATIVE NEGATIVE Final    Comment: (NOTE) The Xpert SA Assay (FDA approved for NASAL specimens in patients 80 years of age and older), is one component of a comprehensive surveillance program. It is not intended to diagnose infection nor to guide or monitor treatment. Performed at [facility], [address], [place], [place] [address]   SARS Coronavirus 2 by RT PCR (hospital order, performed in [facility] hospital lab) Nasopharyngeal Nasopharyngeal Swab     Status: None   Collection Time: 07/07/19 12:29 PM   Specimen: Nasopharyngeal Swab  Result Value Ref Range Status   SARS Coronavirus 2 NEGATIVE NEGATIVE Final    Comment: (NOTE) SARS-CoV-2 target nucleic acids are NOT DETECTED.  The SARS-CoV-2 RNA is generally detectable in upper and lower respiratory specimens during the acute phase of infection. The lowest concentration of SARS-CoV-2 viral copies this assay can detect is 250 copies / mL. A negative result does not preclude SARS-CoV-2 infection and should not be used as the sole basis for treatment or other patient management decisions.  A  negative result may occur with improper specimen collection / handling, submission of specimen other than nasopharyngeal swab, presence of viral mutation(s) within the areas targeted by this assay, and inadequate number of viral copies (<250 copies / mL). A negative result must be combined with clinical observations, patient history, and epidemiological information.  Fact Sheet for Patients:   BoilerBrush.com.cy  Fact Sheet for Healthcare  Providers: BankingDealers.co.za  This test is not yet approved or  cleared by the [place] and has been authorized for detection and/or diagnosis of SARS-CoV-2 by FDA under an Emergency Use Authorization (EUA).  This EUA will remain in effect (meaning this test can be used) for the duration of the COVID-19 declaration under Section 564(b)(1) of the Act, 21 U.S.C. section 360bbb-3(b)(1), unless the authorization is terminated or revoked sooner.  Performed at [facility], [place] [address], [place], [place] [address]       Radiology Studies: MR LUMBAR SPINE WO CONTRAST  Result Date: 07/06/2019 CLINICAL DATA:  Low back pain, increased fracture risk. Recent hip replacement. EXAM: MRI LUMBAR SPINE WITHOUT CONTRAST TECHNIQUE: Multiplanar, multisequence MR imaging of the lumbar spine was performed. No intravenous contrast was administered. COMPARISON:  CT lumbar spine 07/05/2019 FINDINGS: Segmentation:  Normal Alignment: Moderate to severe lumbar scoliosis. Normal sagittal alignment Vertebrae: Negative for acute fracture or mass. Severe chronic compression fracture T12 vertebral body without bone marrow edema. No change from recent CT. Conus medullaris and cauda equina: Conus extends to the L1-2 level. Conus and cauda equina appear normal. Paraspinal and other soft tissues: Negative for paraspinous mass or adenopathy. Bilateral renal cysts. Disc levels: T12-L1: Negative for stenosis.  Mild disc and  facet degeneration L1-2: Moderate disc degeneration and spurring. Mild spinal stenosis and mild facet degeneration bilaterally. L2-3: Mild disc bulging. Small left sided disc protrusion without neural impingement. Moderate facet hypertrophy bilaterally. Mild spinal stenosis. L3-4: Asymmetric disc degeneration and spurring on the right. Moderate subarticular stenosis on the right with expected impingement of the right L4 nerve root. Mild spinal stenosis L4-5: Disc degeneration with disc space narrowing and endplate spurring. Moderate to severe subarticular stenosis on the left. Bilateral facet degeneration. Spinal canal adequate in size L5-S1: Mild disc degeneration. Moderate facet degeneration. Mild subarticular stenosis on the right. IMPRESSION: Negative for acute fracture. Severe chronic compression fracture T12 Moderate to severe scoliosis. Multilevel degenerative changes above. Electronically Signed   By: Franchot Gallo M.D.   On: 07/06/2019 19:22    Scheduled Meds:  aspirin  81 mg Oral BID WC   docusate sodium  100 mg Oral BID   feeding supplement (ENSURE ENLIVE)  237 mL Oral Q24H   lisinopril  20 mg Oral Daily   multivitamin with minerals  1 tablet Oral Daily   pantoprazole  40 mg Oral Daily   Continuous Infusions:    LOS: 5 days   Time spent: 25 minutes   Darliss Cheney, MD [facility]  07/08/2019, 3:40 PM   To contact the attending provider between 7A-7P or the covering provider during after hours 7P-7A, please log into the web site [email or web address].

## 2019-07-09 LAB — CBC WITH DIFFERENTIAL/PLATELET
Abs Immature Granulocytes: 0.11 10*3/uL — ABNORMAL HIGH (ref 0.00–0.07)
Basophils Absolute: 0 10*3/uL (ref 0.0–0.1)
Basophils Relative: 0 %
Eosinophils Absolute: 0.2 10*3/uL (ref 0.0–0.5)
Eosinophils Relative: 2 %
HCT: 28.7 % — ABNORMAL LOW (ref 36.0–46.0)
Hemoglobin: 8.9 g/dL — ABNORMAL LOW (ref 12.0–15.0)
Immature Granulocytes: 1 %
Lymphocytes Relative: 15 %
Lymphs Abs: 1.7 10*3/uL (ref 0.7–4.0)
MCH: 26.9 pg (ref 26.0–34.0)
MCHC: 31 g/dL (ref 30.0–36.0)
MCV: 86.7 fL (ref 80.0–100.0)
Monocytes Absolute: 1.1 10*3/uL — ABNORMAL HIGH (ref 0.1–1.0)
Monocytes Relative: 11 %
Neutro Abs: 7.6 10*3/uL (ref 1.7–7.7)
Neutrophils Relative %: 71 %
Platelets: 409 10*3/uL — ABNORMAL HIGH (ref 150–400)
RBC: 3.31 MIL/uL — ABNORMAL LOW (ref 3.87–5.11)
RDW: 14.7 % (ref 11.5–15.5)
WBC: 10.8 10*3/uL — ABNORMAL HIGH (ref 4.0–10.5)
nRBC: 0 % (ref 0.0–0.2)

## 2019-07-09 NOTE — Plan of Care (Signed)
  Problem: Education: Goal: Knowledge of General Education information will improve Description Including pain rating scale, medication(s)/side effects and non-pharmacologic comfort measures Outcome: Progressing   Problem: Health Behavior/Discharge Planning: Goal: Ability to manage health-related needs will improve Outcome: Progressing   Problem: Clinical Measurements: Goal: Will remain free from infection Outcome: Progressing   Problem: Activity: Goal: Risk for activity intolerance will decrease Outcome: Progressing   Problem: Nutrition: Goal: Adequate nutrition will be maintained Outcome: Progressing   Problem: Coping: Goal: Level of anxiety will decrease Outcome: Progressing   Problem: Pain Managment: Goal: General experience of comfort will improve Outcome: Progressing   Problem: Safety: Goal: Ability to remain free from injury will improve Outcome: Progressing   Problem: Skin Integrity: Goal: Risk for impaired skin integrity will decrease Outcome: Progressing   

## 2019-07-09 NOTE — TOC Progression Note (Signed)
Transition of Care [facility]) - Progression Note    Patient Details  Name: Dante Cooter MRN: [id number] Date of Birth: 07-07-1927  Transition of Care [facility]) CM/SW Contact  Epifanio Lesches, RN Phone Number: [phone] 07/09/2019, 9:59 AM  Clinical Narrative:    NCM f/u with [facility] admissions regarding insurance authorization, voice message left. Awaiting call back. TOC team will continue to monitor.   Expected Discharge Plan: Skilled Nursing Facility Barriers to Discharge: Insurance Authorization  Expected Discharge Plan and Services Expected Discharge Plan: Skilled Nursing Facility         Expected Discharge Date: 07/08/19                                     Social Determinants of Health (SDOH) Interventions    Readmission Risk Interventions No flowsheet data found.

## 2019-07-09 NOTE — Progress Notes (Signed)
Patient is irate this morning, crying and saying that she used called bell and no one is helping her.  Nurse is present in the room to assist her but yet she is complaining that no one is helping her.  Nurse reminded patient that we can not assist her unless she tells [place] what is needed.

## 2019-07-09 NOTE — Progress Notes (Signed)
PROGRESS NOTE    Sabrina Glass  XBW:[id number] DOB: [birth year] DOA: 07/03/2019 PCP: Patient, No Pcp Per   Brief Narrative:   Sabrina Glass is a 86 y.o. female with history of hypertension was brought to the ER at [facility] at [facility] after patient had a fall. she slipped and fell when her leg gave way.  Did not hit her head or lose consciousness. x-rays in the ER showed left hip fracture and patient was transferred to [facility] for further management.  Labs over that showed anemia with hemoglobin around 9 mild leukocytosis chest x-ray showing chronic findings.  Patient underwent left hip arthroplasty on 07/05/2019.  Assessment & Plan:   Principal Problem:   Closed left hip fracture, initial encounter [facility]) Active Problems:   Hip fracture (HCC)   Essential hypertension   Anemia   Pressure injury of skin  1. Left hip fracture status post mechanical fall: Status post total left hip arthroplasty, anterior approach by Dr. Linna Caprice on 07/05/2019. Pain is controlled. Management per orthopedics. 2. Essential hypertension: Blood pressure slightly elevated this morning likely due to being irate.  Continue lisinopril and as needed hydralazine. 3. Acute blood loss anemia: Hemoglobin dropped from 9.7>7.5> 7.1 07/07/2019.  Received 1 unit of PRBC transfusion.  Posttransfusion hemoglobin today is 9.7> 8.9.  She is stable without any symptoms.  4. Leukocytosis could be reactionary patient is afebrile.  Closely monitor.  This had resolved but now she has mild leukocytosis again but again she remains afebrile without any signs or symptoms of infection. 5. Mild hypokalemia: Resolved.  DVT prophylaxis: SCDs Start: 07/05/19 1054 and aspirin 81 p.o. twice daily per orthopedics   Code Status: Full Code  Family Communication: None present at bedside.  Plan of care discussed with patient in length and he verbalized understanding and agreed with it.  Status is: Inpatient  Remains inpatient  appropriate because:Inpatient level of care appropriate due to severity of illness   Dispo: The patient is from: Home              Anticipated d/c is to: SNF              Anticipated d/c date is: 07/09/2019 if authorization obtained otherwise 07/10/2019              Patient currently is medically stable to d/c.        Estimated body mass index is 20.2 kg/m as calculated from the following:   Height as of this encounter: 5\' 1"  (1.549 m).   Weight as of this encounter: 48.5 kg.      Nutritional status:  Nutrition Problem: Increased nutrient needs Etiology: hip fracture, wound healing (Left hip fracture pending surgical intervention, stage II PI present on admission)   Signs/Symptoms: estimated needs   Interventions: Ensure Enlive (each supplement provides 350kcal and 20 grams of protein), Refer to RD note for recommendations    Consultants:   Orthopedics  Procedures:   Left total hip arthroplasty  Antimicrobials:  Anti-infectives (From admission, onward)   Start     Dose/Rate Route Frequency Ordered Stop   07/05/19 1100  ceFAZolin (ANCEF) IVPB 2g/100 mL premix        2 g 200 mL/hr over 30 Minutes Intravenous Every 6 hours 07/05/19 1053 07/05/19 1754   07/04/19 0730  ceFAZolin (ANCEF) IVPB 2g/100 mL premix  Status:  Discontinued        2 g 200 mL/hr over 30 Minutes Intravenous On call to O.R. 07/04/19 09/03/19  07/05/19 0559   07/04/19 0730  ceFAZolin (ANCEF) 3 g in dextrose 5 % 50 mL IVPB  Status:  Discontinued        3 g 100 mL/hr over 30 Minutes Intravenous On call to O.R. 07/04/19 0720 07/04/19 0723         Subjective: Patient seen and examined.  She was very angry and continues to complain of not a good treatment by nursing staff.  Per nurses, patient continues to complain of things and calling them but will not tell them the reason for the call.  Patient emotional and started crying.  Despite of this, she was alert and oriented.  Objective: Vitals:    07/08/19 1420 07/08/19 2042 07/09/19 0549 07/09/19 0755  BP: (!) 157/85 (!) 157/74 (!) 185/92 (!) 185/94  Pulse: 91 98 93 92  Resp:  20 16   Temp: 98.5 F (36.9 C) 97.7 F (36.5 C) 98.9 F (37.2 C) 98.1 F (36.7 C)  TempSrc: Oral Axillary Oral Oral  SpO2: 98% 98% 97% 96%  Weight:      Height:       No intake or output data in the 24 hours ending 07/09/19 1433 Filed Weights   07/04/19 0902  Weight: 48.5 kg    Examination:  General exam: Appears calm and comfortable  Respiratory system: Clear to auscultation. Respiratory effort normal. Cardiovascular system: S1 & S2 heard, RRR. No JVD, murmurs, rubs, gallops or clicks. No pedal edema. Gastrointestinal system: Abdomen is nondistended, soft and nontender. No organomegaly or masses felt. Normal bowel sounds heard. Central nervous system: Alert and oriented. No focal neurological deficits. Extremities: Symmetric 5 x 5 power. Skin: No rashes, lesions or ulcers.  Psychiatry: Judgement and insight appear normal. Mood & affect appropriate.   Data Reviewed: I have personally reviewed following labs and imaging studies  CBC: Recent Labs  Lab 07/04/19 0054 07/04/19 0054 07/06/19 0203 07/07/19 0309 07/07/19 1647 07/08/19 0819 07/09/19 1035  WBC 13.2*  --  12.7* 10.3  --  12.5* 10.8*  NEUTROABS 9.2*  --   --   --   --   --  7.6  HGB 9.7*   < > 7.5* 7.1* 8.5* 9.7* 8.9*  HCT 32.0*   < > 24.0* 23.5* 26.7* 31.1* 28.7*  MCV 87.2  --  87.0 87.0  --  86.4 86.7  PLT 411*  --  345 289  --  418* 409*   < > = values in this interval not displayed.   Basic Metabolic Panel: Recent Labs  Lab 07/04/19 0054 07/04/19 1134 07/06/19 0203 07/07/19 0309 07/08/19 0819  NA 137 136 134* 135 138  K 3.2* 3.4* 3.8 3.8 4.1  CL 100 100 98 99 101  CO2 27 28 24 27 26   GLUCOSE 116* 108* 111* 121* 133*  BUN 6* 8 19 13 12   CREATININE 0.61 0.63 0.82 0.70 0.74  CALCIUM 8.6* 8.1* 8.0* 7.9* 8.4*   GFR: Estimated Creatinine Clearance: 35.3 mL/min (by  C-G formula based on SCr of 0.74 mg/dL). Liver Function Tests: Recent Labs  Lab 07/04/19 0054  AST 23  ALT 18  ALKPHOS 85  BILITOT 0.5  PROT 7.1  ALBUMIN 2.8*   No results for input(s): LIPASE, AMYLASE in the last 168 hours. No results for input(s): AMMONIA in the last 168 hours. Coagulation Profile: No results for input(s): INR, PROTIME in the last 168 hours. Cardiac Enzymes: No results for input(s): CKTOTAL, CKMB, CKMBINDEX, TROPONINI in the last 168 hours. BNP (last  3 results) No results for input(s): PROBNP in the last 8760 hours. HbA1C: No results for input(s): HGBA1C in the last 72 hours. CBG: No results for input(s): GLUCAP in the last 168 hours. Lipid Profile: No results for input(s): CHOL, HDL, LDLCALC, TRIG, CHOLHDL, LDLDIRECT in the last 72 hours. Thyroid Function Tests: No results for input(s): TSH, T4TOTAL, FREET4, T3FREE, THYROIDAB in the last 72 hours. Anemia Panel: No results for input(s): VITAMINB12, FOLATE, FERRITIN, TIBC, IRON, RETICCTPCT in the last 72 hours. Sepsis Labs: No results for input(s): PROCALCITON, LATICACIDVEN in the last 168 hours.  Recent Results (from the past 240 hour(s))  SARS Coronavirus 2 by RT PCR (hospital order, performed in [facility] hospital lab) Nasopharyngeal Nasopharyngeal Swab     Status: None   Collection Time: 07/04/19  5:03 AM   Specimen: Nasopharyngeal Swab  Result Value Ref Range Status   SARS Coronavirus 2 NEGATIVE NEGATIVE Final    Comment: (NOTE) SARS-CoV-2 target nucleic acids are NOT DETECTED.  The SARS-CoV-2 RNA is generally detectable in upper and lower respiratory specimens during the acute phase of infection. The lowest concentration of SARS-CoV-2 viral copies this assay can detect is 250 copies / mL. A negative result does not preclude SARS-CoV-2 infection and should not be used as the sole basis for treatment or other patient management decisions.  A negative result may occur with improper specimen  collection / handling, submission of specimen other than nasopharyngeal swab, presence of viral mutation(s) within the areas targeted by this assay, and inadequate number of viral copies (<250 copies / mL). A negative result must be combined with clinical observations, patient history, and epidemiological information.  Fact Sheet for Patients:   BoilerBrush.com.cy  Fact Sheet for Healthcare Providers: https://pope.com/  This test is not yet approved or  cleared by the [place] FDA and has been authorized for detection and/or diagnosis of SARS-CoV-2 by FDA under an Emergency Use Authorization (EUA).  This EUA will remain in effect (meaning this test can be used) for the duration of the COVID-19 declaration under Section 564(b)(1) of the Act, 21 U.S.C. section 360bbb-3(b)(1), unless the authorization is terminated or revoked sooner.  Performed at [facility], [address] [place], [place] [address]   Surgical pcr screen     Status: None   Collection Time: 07/04/19 11:14 AM   Specimen: Nasal Mucosa; Nasal Swab  Result Value Ref Range Status   MRSA, PCR NEGATIVE NEGATIVE Final   Staphylococcus aureus NEGATIVE NEGATIVE Final    Comment: (NOTE) The Xpert SA Assay (FDA approved for NASAL specimens in patients 16 years of age and older), is one component of a comprehensive surveillance program. It is not intended to diagnose infection nor to guide or monitor treatment. Performed at [facility], [address] [place], [place] [address]   SARS Coronavirus 2 by RT PCR (hospital order, performed in [facility] hospital lab) Nasopharyngeal Nasopharyngeal Swab     Status: None   Collection Time: 07/07/19 12:29 PM   Specimen: Nasopharyngeal Swab  Result Value Ref Range Status   SARS Coronavirus 2 NEGATIVE NEGATIVE Final    Comment: (NOTE) SARS-CoV-2 target nucleic acids are NOT DETECTED.  The SARS-CoV-2 RNA is  generally detectable in upper and lower respiratory specimens during the acute phase of infection. The lowest concentration of SARS-CoV-2 viral copies this assay can detect is 250 copies / mL. A negative result does not preclude SARS-CoV-2 infection and should not be used as the sole basis  for treatment or other patient management decisions.  A negative result may occur with improper specimen collection / handling, submission of specimen other than nasopharyngeal swab, presence of viral mutation(s) within the areas targeted by this assay, and inadequate number of viral copies (<250 copies / mL). A negative result must be combined with clinical observations, patient history, and epidemiological information.  Fact Sheet for Patients:   BoilerBrush.com.cy  Fact Sheet for Healthcare Providers: https://pope.com/  This test is not yet approved or  cleared by the [place] FDA and has been authorized for detection and/or diagnosis of SARS-CoV-2 by FDA under an Emergency Use Authorization (EUA).  This EUA will remain in effect (meaning this test can be used) for the duration of the COVID-19 declaration under Section 564(b)(1) of the Act, 21 U.S.C. section 360bbb-3(b)(1), unless the authorization is terminated or revoked sooner.  Performed at [facility], [address] [place], [place] [address]       Radiology Studies: No results found.  Scheduled Meds: . aspirin  81 mg Oral BID WC  . docusate sodium  100 mg Oral BID  . feeding supplement (ENSURE ENLIVE)  237 mL Oral Q24H  . lisinopril  20 mg Oral Daily  . multivitamin with minerals  1 tablet Oral Daily  . pantoprazole  40 mg Oral Daily   Continuous Infusions:    LOS: 6 days   Time spent: 27 minutes   Hughie Closs, MD [facility]  07/09/2019, 2:33 PM   To contact the attending provider between 7A-7P or the covering provider during after hours 7P-7A,  please log into the web site [email or web address].

## 2019-07-09 NOTE — Care Management Important Message (Signed)
Important Message  Patient Details  Name: Sabrina Glass MRN: [id number] Date of Birth: 03/04/1927   Medicare Important Message Given:        [name] 07/09/2019, 1:39 PM

## 2019-07-10 LAB — CBC WITH DIFFERENTIAL/PLATELET
Abs Immature Granulocytes: 0.12 10*3/uL — ABNORMAL HIGH (ref 0.00–0.07)
Basophils Absolute: 0.1 10*3/uL (ref 0.0–0.1)
Basophils Relative: 1 %
Eosinophils Absolute: 0.1 10*3/uL (ref 0.0–0.5)
Eosinophils Relative: 1 %
HCT: 30.7 % — ABNORMAL LOW (ref 36.0–46.0)
Hemoglobin: 9.5 g/dL — ABNORMAL LOW (ref 12.0–15.0)
Immature Granulocytes: 1 %
Lymphocytes Relative: 19 %
Lymphs Abs: 2.2 10*3/uL (ref 0.7–4.0)
MCH: 27.1 pg (ref 26.0–34.0)
MCHC: 30.9 g/dL (ref 30.0–36.0)
MCV: 87.7 fL (ref 80.0–100.0)
Monocytes Absolute: 1.1 10*3/uL — ABNORMAL HIGH (ref 0.1–1.0)
Monocytes Relative: 10 %
Neutro Abs: 7.9 10*3/uL — ABNORMAL HIGH (ref 1.7–7.7)
Neutrophils Relative %: 68 %
Platelets: 483 10*3/uL — ABNORMAL HIGH (ref 150–400)
RBC: 3.5 MIL/uL — ABNORMAL LOW (ref 3.87–5.11)
RDW: 14.8 % (ref 11.5–15.5)
WBC: 11.5 10*3/uL — ABNORMAL HIGH (ref 4.0–10.5)
nRBC: 0 % (ref 0.0–0.2)

## 2019-07-10 NOTE — TOC Progression Note (Signed)
Transition of Care [facility]) - Progression Note    Patient Details  Name: Sabrina Glass MRN: [id number] Date of Birth: 08-28-1927  Transition of [facility]) CM/SW Contact  Epifanio Lesches, RN Phone Number: [phone] 07/10/2019, 2:53 PM  Clinical Narrative:    Still awaiting SNF authorization for placement ....  TOC team will continue to monitor and follow.   Expected Discharge Plan: Skilled Nursing Facility Barriers to Discharge: Insurance Authorization  Expected Discharge Plan and Services Expected Discharge Plan: Skilled Nursing Facility         Expected Discharge Date: 07/08/19                                     Social Determinants of Health (SDOH) Interventions    Readmission Risk Interventions No flowsheet data found.

## 2019-07-10 NOTE — Progress Notes (Signed)
PROGRESS NOTE    Sabrina Glass  XBL:[id number] DOB: 1927-04-05 DOA: 07/03/2019 PCP: Patient, No Pcp Per   Brief Narrative:   Sabrina Glass is a 86 y.o. female with history of hypertension was brought to the ER at [facility] at [facility] after patient had a fall. she slipped and fell when her leg gave way.  Did not hit her head or lose consciousness. x-rays in the ER showed left hip fracture and patient was transferred to [facility] for further management.  Labs over that showed anemia with hemoglobin around 9 mild leukocytosis chest x-ray showing chronic findings.  Patient underwent left hip arthroplasty on 07/05/2019.  Assessment & Plan:   Principal Problem:   Closed left hip fracture, initial encounter [facility]) Active Problems:   Hip fracture (HCC)   Essential hypertension   Anemia   Pressure injury of skin  1. Left hip fracture status post mechanical fall: Status post total left hip arthroplasty, anterior approach by Dr. Linna Caprice on 07/05/2019. Pain is controlled. Management per orthopedics. 2. Essential hypertension: Blood pressure very well controlled.  Continue lisinopril and as needed hydralazine. 3. Acute blood loss anemia: Hemoglobin dropped from 9.7>7.5> 7.1 07/07/2019.  Received 1 unit of PRBC transfusion.  Posttransfusion hemoglobin today is 9.7> 8.9> 9.5.  She is stable without any symptoms.  4. Leukocytosis could be reactionary patient is afebrile.  Closely monitor.  This had resolved but now she has mild leukocytosis again but again she remains afebrile without any signs or symptoms of infection. 5. Mild hypokalemia: Resolved.  DVT prophylaxis: SCDs Start: 07/05/19 1054 and aspirin 81 p.o. twice daily per orthopedics   Code Status: Full Code  Family Communication: None present at bedside.  Plan of care discussed with patient in length and he verbalized understanding and agreed with it.  Status is: Inpatient  Remains inpatient appropriate because:Inpatient  level of care appropriate due to severity of illness   Dispo: The patient is from: Home              Anticipated d/c is to: SNF              Anticipated d/c date is: 07/10/2019 if authorization obtained otherwise 07/11/2019              Patient currently is medically stable to d/c.  Patient has been stable for last 3 to 4 days awaiting insurance authorization to be transferred to SNF.        Estimated body mass index is 20.2 kg/m as calculated from the following:   Height as of this encounter: 5\' 1"  (1.549 m).   Weight as of this encounter: 48.5 kg.      Nutritional status:  Nutrition Problem: Increased nutrient needs Etiology: hip fracture, wound healing (Left hip fracture pending surgical intervention, stage II PI present on admission)   Signs/Symptoms: estimated needs   Interventions: Ensure Enlive (each supplement provides 350kcal and 20 grams of protein), Refer to RD note for recommendations    Consultants:   Orthopedics  Procedures:   Left total hip arthroplasty  Antimicrobials:  Anti-infectives (From admission, onward)   Start     Dose/Rate Route Frequency Ordered Stop   07/05/19 1100  ceFAZolin (ANCEF) IVPB 2g/100 mL premix        2 g 200 mL/hr over 30 Minutes Intravenous Every 6 hours 07/05/19 1053 07/05/19 1754   07/04/19 0730  ceFAZolin (ANCEF) IVPB 2g/100 mL premix  Status:  Discontinued  2 g 200 mL/hr over 30 Minutes Intravenous On call to O.R. 07/04/19 0720 07/05/19 0559   07/04/19 0730  ceFAZolin (ANCEF) 3 g in dextrose 5 % 50 mL IVPB  Status:  Discontinued        3 g 100 mL/hr over 30 Minutes Intravenous On call to O.R. 07/04/19 0720 07/04/19 0723         Subjective: Seen and examined.  No complaints but a little upset that she had called the nurses to help her go to the bathroom and no one has showed up.  I called nurses while in her room.  They were on their way.  Objective: Vitals:   07/09/19 1500 07/09/19 2017 07/10/19 0234  07/10/19 0809  BP: (!) 157/74 (!) 149/79 (!) 145/95 121/90  Pulse: 90 84 79 (!) 102  Resp:  16 18 16   Temp: 98.7 F (37.1 C) 98.2 F (36.8 C) 98.2 F (36.8 C) 98.5 F (36.9 C)  TempSrc: Oral Oral  Oral  SpO2: 97% 97% 99% 98%  Weight:      Height:        Intake/Output Summary (Last 24 hours) at 07/10/2019 1301 Last data filed at 07/10/2019 1100 Gross per 24 hour  Intake 360 ml  Output 1 ml  Net 359 ml   Filed Weights   07/04/19 0902  Weight: 48.5 kg    Examination:  General exam: Appears calm and comfortable  Respiratory system: Clear to auscultation. Respiratory effort normal. Cardiovascular system: S1 & S2 heard, RRR. No JVD, murmurs, rubs, gallops or clicks. No pedal edema. Gastrointestinal system: Abdomen is nondistended, soft and nontender. No organomegaly or masses felt. Normal bowel sounds heard. Central nervous system: Alert and oriented. No focal neurological deficits. Extremities: Symmetric 5 x 5 power. Skin: No rashes, lesions or ulcers.  Psychiatry: Judgement and insight appear normal. Mood & affect appropriate.   Data Reviewed: I have personally reviewed following labs and imaging studies  CBC: Recent Labs  Lab 07/04/19 0054 07/04/19 0054 07/06/19 0203 07/06/19 0203 07/07/19 0309 07/07/19 1647 07/08/19 0819 07/09/19 1035 07/10/19 0826  WBC 13.2*   < > 12.7*  --  10.3  --  12.5* 10.8* 11.5*  NEUTROABS 9.2*  --   --   --   --   --   --  7.6 7.9*  HGB 9.7*   < > 7.5*   < > 7.1* 8.5* 9.7* 8.9* 9.5*  HCT 32.0*   < > 24.0*   < > 23.5* 26.7* 31.1* 28.7* 30.7*  MCV 87.2   < > 87.0  --  87.0  --  86.4 86.7 87.7  PLT 411*   < > 345  --  289  --  418* 409* 483*   < > = values in this interval not displayed.   Basic Metabolic Panel: Recent Labs  Lab 07/04/19 0054 07/04/19 1134 07/06/19 0203 07/07/19 0309 07/08/19 0819  NA 137 136 134* 135 138  K 3.2* 3.4* 3.8 3.8 4.1  CL 100 100 98 99 101  CO2 27 28 24 27 26   GLUCOSE 116* 108* 111* 121* 133*  BUN  6* 8 19 13 12   CREATININE 0.61 0.63 0.82 0.70 0.74  CALCIUM 8.6* 8.1* 8.0* 7.9* 8.4*   GFR: Estimated Creatinine Clearance: 35.3 mL/min (by C-G formula based on SCr of 0.74 mg/dL). Liver Function Tests: Recent Labs  Lab 07/04/19 0054  AST 23  ALT 18  ALKPHOS 85  BILITOT 0.5  PROT 7.1  ALBUMIN 2.8*  No results for input(s): LIPASE, AMYLASE in the last 168 hours. No results for input(s): AMMONIA in the last 168 hours. Coagulation Profile: No results for input(s): INR, PROTIME in the last 168 hours. Cardiac Enzymes: No results for input(s): CKTOTAL, CKMB, CKMBINDEX, TROPONINI in the last 168 hours. BNP (last 3 results) No results for input(s): PROBNP in the last 8760 hours. HbA1C: No results for input(s): HGBA1C in the last 72 hours. CBG: No results for input(s): GLUCAP in the last 168 hours. Lipid Profile: No results for input(s): CHOL, HDL, LDLCALC, TRIG, CHOLHDL, LDLDIRECT in the last 72 hours. Thyroid Function Tests: No results for input(s): TSH, T4TOTAL, FREET4, T3FREE, THYROIDAB in the last 72 hours. Anemia Panel: No results for input(s): VITAMINB12, FOLATE, FERRITIN, TIBC, IRON, RETICCTPCT in the last 72 hours. Sepsis Labs: No results for input(s): PROCALCITON, LATICACIDVEN in the last 168 hours.  Recent Results (from the past 240 hour(s))  SARS Coronavirus 2 by RT PCR (hospital order, performed in [facility] hospital lab) Nasopharyngeal Nasopharyngeal Swab     Status: None   Collection Time: 07/04/19  5:03 AM   Specimen: Nasopharyngeal Swab  Result Value Ref Range Status   SARS Coronavirus 2 NEGATIVE NEGATIVE Final    Comment: (NOTE) SARS-CoV-2 target nucleic acids are NOT DETECTED.  The SARS-CoV-2 RNA is generally detectable in upper and lower respiratory specimens during the acute phase of infection. The lowest concentration of SARS-CoV-2 viral copies this assay can detect is 250 copies / mL. A negative result does not preclude SARS-CoV-2 infection and  should not be used as the sole basis for treatment or other patient management decisions.  A negative result may occur with improper specimen collection / handling, submission of specimen other than nasopharyngeal swab, presence of viral mutation(s) within the areas targeted by this assay, and inadequate number of viral copies (<250 copies / mL). A negative result must be combined with clinical observations, patient history, and epidemiological information.  Fact Sheet for Patients:   StrictlyIdeas.no  Fact Sheet for Healthcare Providers: BankingDealers.co.za  This test is not yet approved or  cleared by the [place] FDA and has been authorized for detection and/or diagnosis of SARS-CoV-2 by FDA under an Emergency Use Authorization (EUA).  This EUA will remain in effect (meaning this test can be used) for the duration of the COVID-19 declaration under Section 564(b)(1) of the Act, 21 U.S.C. section 360bbb-3(b)(1), unless the authorization is terminated or revoked sooner.  Performed at [facility], [place] [address], [place], [place] [address]   Surgical pcr screen     Status: None   Collection Time: 07/04/19 11:14 AM   Specimen: Nasal Mucosa; Nasal Swab  Result Value Ref Range Status   MRSA, PCR NEGATIVE NEGATIVE Final   Staphylococcus aureus NEGATIVE NEGATIVE Final    Comment: (NOTE) The Xpert SA Assay (FDA approved for NASAL specimens in patients 70 years of age and older), is one component of a comprehensive surveillance program. It is not intended to diagnose infection nor to guide or monitor treatment. Performed at [facility], [place] [address] [place], [place] [address]   SARS Coronavirus 2 by RT PCR (hospital order, performed in [facility] hospital lab) Nasopharyngeal Nasopharyngeal Swab     Status: None   Collection Time: 07/07/19 12:29 PM   Specimen: Nasopharyngeal Swab  Result Value Ref Range Status    SARS Coronavirus 2 NEGATIVE NEGATIVE Final    Comment: (NOTE) SARS-CoV-2 target nucleic acids are NOT DETECTED.  The SARS-CoV-2 RNA is generally detectable in upper and lower respiratory specimens during the acute phase of infection. The lowest concentration of SARS-CoV-2 viral copies this assay can detect is 250 copies / mL. A negative result does not preclude SARS-CoV-2 infection and should not be used as the sole basis for treatment or other patient management decisions.  A negative result may occur with improper specimen collection / handling, submission of specimen other than nasopharyngeal swab, presence of viral mutation(s) within the areas targeted by this assay, and inadequate number of viral copies (<250 copies / mL). A negative result must be combined with clinical observations, patient history, and epidemiological information.  Fact Sheet for Patients:   BoilerBrush.com.cy  Fact Sheet for Healthcare Providers: https://pope.com/  This test is not yet approved or  cleared by the [place] FDA and has been authorized for detection and/or diagnosis of SARS-CoV-2 by FDA under an Emergency Use Authorization (EUA).  This EUA will remain in effect (meaning this test can be used) for the duration of the COVID-19 declaration under Section 564(b)(1) of the Act, 21 U.S.C. section 360bbb-3(b)(1), unless the authorization is terminated or revoked sooner.  Performed at [facility], [address], [place], [place] [address]       Radiology Studies: No results found.  Scheduled Meds: . aspirin  81 mg Oral BID WC  . docusate sodium  100 mg Oral BID  . feeding supplement (ENSURE ENLIVE)  237 mL Oral Q24H  . lisinopril  20 mg Oral Daily  . multivitamin with minerals  1 tablet Oral Daily  . pantoprazole  40 mg Oral Daily   Continuous Infusions:    LOS: 7 days   Time spent: 25 minutes   Hughie Closs, MD [facility]  07/10/2019, 1:01 PM   To contact the attending provider between 7A-7P or the covering provider during after hours 7P-7A, please log into the web site [email or web address].

## 2019-07-10 NOTE — Progress Notes (Signed)
Physical Therapy Treatment Patient Details Name: Sabrina Glass MRN: [id number] DOB: 1927-08-25 Today's Date: 07/10/2019    History of Present Illness Patient is a 86 y/o female who presents with left hip fx s/p fall now s/p left THA 07/05/19. PMH includes HTN.    PT Comments    Pt continues to require min to mod assistance.  SNF remains appropriate.     Follow Up Recommendations  SNF;Supervision for mobility/OOB     Equipment Recommendations  None recommended by PT    Recommendations for Other Services       Precautions / Restrictions Precautions Precautions: Fall Restrictions Weight Bearing Restrictions: Yes LLE Weight Bearing: Weight bearing as tolerated    Mobility  Bed Mobility Overal bed mobility: Needs Assistance Bed Mobility: Supine to Sit     Supine to sit: Mod assist     General bed mobility comments: Assistance for LE advancement and trunk elebvation.  Pt with rounded spine and posterior pelvic tilt.  Transfers Overall transfer level: Needs assistance Equipment used: Rolling walker (2 wheeled) Transfers: Sit to/from Stand Sit to Stand: Mod assist         General transfer comment: Assist to power to standing with cues for hand placement; stood from EOB, min assistance for balance.  Ambulation/Gait Ambulation/Gait assistance: Min assist;Mod assist Gait Distance (Feet): 10 Feet (x2) Assistive device: Rolling walker (2 wheeled) Gait Pattern/deviations: Step-through pattern;Antalgic;Trunk flexed;Decreased stride length;Decreased step length - right;Decreased step length - left     General Gait Details: Pt required assistance to maintain pathway of RW, pt present with kyphotic posturing.  Cues for sequencing and forward gaze.   Stairs             Wheelchair Mobility    Modified Rankin (Stroke Patients Only)       Balance Overall balance assessment: Needs assistance   Sitting balance-Leahy Scale: Fair       Standing balance-Leahy  Scale: Poor                              Cognition Arousal/Alertness: Awake/alert Behavior During Therapy: WFL for tasks assessed/performed Overall Cognitive Status: No family/caregiver present to determine baseline cognitive functioning Area of Impairment: Memory                     Memory: Decreased short-term memory         General Comments: Pt tangential with speech and not directly answering questions.      Exercises      General Comments        Pertinent Vitals/Pain Pain Assessment: Faces Faces Pain Scale: Hurts even more Pain Location: left hip with movement Pain Descriptors / Indicators: Operative site guarding;Sore Pain Intervention(s): Monitored during session;Repositioned    Home Living                      Prior Function            PT Goals (current goals can now be found in the care plan section) Acute Rehab PT Goals Patient Stated Goal: to get up and move Potential to Achieve Goals: Good Progress towards PT goals: Progressing toward goals    Frequency    Min 3X/week      PT Plan Current plan remains appropriate    Co-evaluation              AM-PAC PT "6 Clicks" Mobility   Outcome Measure  Help needed turning from your back to your side while in a flat bed without using bedrails?: A Little Help needed moving from lying on your back to sitting on the side of a flat bed without using bedrails?: A Lot Help needed moving to and from a bed to a chair (including a wheelchair)?: A Lot Help needed standing up from a chair using your arms (e.g., wheelchair or bedside chair)?: A Lot Help needed to walk in hospital room?: A Lot Help needed climbing 3-5 steps with a railing? : Total 6 Click Score: 12    End of Session Equipment Utilized During Treatment: Gait belt Activity Tolerance: Patient tolerated treatment well Patient left: in chair;with call bell/phone within reach;with chair alarm set Nurse  Communication: Mobility status PT Visit Diagnosis: Pain;Muscle weakness (generalized) (M62.81);Unsteadiness on feet (R26.81);Difficulty in walking, not elsewhere classified (R26.2) Pain - Right/Left: Left Pain - part of body: Hip     Time: 5643-3295 PT Time Calculation (min) (ACUTE ONLY): 14 min  Charges:  $Gait Training: 8-22 mins                     Erasmo Leventhal , PTA Acute Rehabilitation Services Pager [phone] Office [phone]     Aneisha Skyles Eli Hose 07/10/2019, 5:00 PM

## 2019-07-10 NOTE — Plan of Care (Signed)
  Problem: Education: Goal: Knowledge of General Education information will improve Description: Including pain rating scale, medication(s)/side effects and non-pharmacologic comfort measures Outcome: Progressing   Problem: Clinical Measurements: Goal: Ability to maintain clinical measurements within normal limits will improve Outcome: Progressing   Problem: Activity: Goal: Risk for activity intolerance will decrease Outcome: Progressing   Problem: Nutrition: Goal: Adequate nutrition will be maintained Outcome: Progressing   Problem: Coping: Goal: Level of anxiety will decrease Outcome: Progressing   Problem: Elimination: Goal: Will not experience complications related to bowel motility Outcome: Progressing   Problem: Pain Managment: Goal: General experience of comfort will improve Outcome: Progressing   

## 2019-07-11 NOTE — TOC Progression Note (Signed)
Transition of Care [facility]) - Progression Note    Patient Details  Name: Temple Ewart MRN: [id number] Date of Birth: 17-Oct-1927  [facility] of [facility]) CM/SW Contact  Nonda Lou, [place] Phone Number: 07/11/2019, 2:15 PM  Clinical Narrative:     CSW contacted [facility] to inquire on patient's insurance authorization. CSW informed by Diplomatic Services operational officer the admissions department does not work during the weekend and will be back in Monday at 9am. CSW will follow-up.  Expected Discharge Plan: Skilled Nursing Facility Barriers to Discharge: Insurance Authorization  Expected Discharge Plan and Services Expected Discharge Plan: Skilled Nursing Facility         Expected Discharge Date: 07/08/19                                     Social Determinants of Health (SDOH) Interventions    Readmission Risk Interventions No flowsheet data found.

## 2019-07-11 NOTE — Plan of Care (Signed)

## 2019-07-11 NOTE — Progress Notes (Signed)
PROGRESS NOTE    Sabrina Glass  ZJI:[id number] DOB: 05/31/1927 DOA: 07/03/2019 PCP: Patient, No Pcp Per   Brief Narrative:  As per Dr. Hughie Closs "Sabrina Glass is a 86 y.o. female with history of hypertension was brought to the ER at [facility] at [facility] after patient had a fall. she slipped and fell when her leg gave way.  Did not hit her head or lose consciousness. x-rays in the ER showed left hip fracture and patient was transferred to [facility] for further management.  Labs over that showed anemia with hemoglobin around 9 mild leukocytosis chest x-ray showing chronic findings.  Patient underwent left hip arthroplasty on 07/05/2019".  07/11/2019: Patient seen.  No new changes.  Patient is awaiting disposition.  We discontinue as needed metoclopramide.  Assessment & Plan:   Principal Problem:   Closed left hip fracture, initial encounter [facility]) Active Problems:   Hip fracture (HCC)   Essential hypertension   Anemia   Pressure injury of skin  1. Left hip fracture status post mechanical fall: Status post total left hip arthroplasty, anterior approach by Dr. Linna Caprice on 07/05/2019. Pain is controlled. Management per orthopedics. 07/11/2019: Pursue disposition.  2. Essential hypertension: Blood pressure very well controlled.  Continue lisinopril and as needed hydralazine. 07/11/2019: Continue to optimize blood pressure.  Goal blood pressure should be less than 130/80 mmHg.  Adequate analgesia.  3. Acute blood loss anemia: Hemoglobin dropped from 9.7>7.5> 7.1 07/07/2019.  Received 1 unit of PRBC transfusion.  Posttransfusion hemoglobin today is 9.7> 8.9> 9.5.  She is stable without any symptoms. 07/11/2019: H&H is stable.  4. Leukocytosis could be reactionary patient is afebrile.  Closely monitor.  This had resolved but now she has mild leukocytosis again but again she remains afebrile without any signs or symptoms of infection. 07/11/2019: Continue to monitor WBC.  No  signs of infection.  5. Mild hypokalemia:  Resolved.  DVT prophylaxis: SCDs Start: 07/05/19 1054 and aspirin 81 p.o. twice daily per orthopedics   Code Status: Full Code  Family Communication: None present at bedside.  Plan of care discussed with patient in length and he verbalized understanding and agreed with it.  Status is: Inpatient  Remains inpatient appropriate because:Inpatient level of care appropriate due to severity of illness   Dispo: The patient is from: Home              Anticipated d/c is to: SNF              Anticipated d/c date is: 07/10/2019 if authorization obtained otherwise 07/11/2019              Patient currently is medically stable to d/c.  Patient has been stable for last 3 to 4 days awaiting insurance authorization to be transferred to SNF.   Estimated body mass index is 20.2 kg/m as calculated from the following:   Height as of this encounter: 5\' 1"  (1.549 m).   Weight as of this encounter: 48.5 kg.  Nutritional status:  Nutrition Problem: Increased nutrient needs Etiology: hip fracture, wound healing (Left hip fracture pending surgical intervention, stage II PI present on admission)   Signs/Symptoms: estimated needs   Interventions: Ensure Enlive (each supplement provides 350kcal and 20 grams of protein), Refer to RD note for recommendations    Consultants:   Orthopedics  Procedures:   Left total hip arthroplasty  Antimicrobials:  Anti-infectives (From admission, onward)   Start     Dose/Rate Route Frequency Ordered Stop  07/05/19 1100  ceFAZolin (ANCEF) IVPB 2g/100 mL premix        2 g 200 mL/hr over 30 Minutes Intravenous Every 6 hours 07/05/19 1053 07/05/19 1754   07/04/19 0730  ceFAZolin (ANCEF) IVPB 2g/100 mL premix  Status:  Discontinued        2 g 200 mL/hr over 30 Minutes Intravenous On call to O.R. 07/04/19 0720 07/05/19 0559   07/04/19 0730  ceFAZolin (ANCEF) 3 g in dextrose 5 % 50 mL IVPB  Status:  Discontinued        3  g 100 mL/hr over 30 Minutes Intravenous On call to O.R. 07/04/19 0720 07/04/19 0723         Subjective: Patient seen. No new complaints. No shortness of breath. No chest pain.  Objective: Vitals:   07/10/19 1927 07/11/19 0454 07/11/19 0733 07/11/19 1317  BP: 130/82 120/88 (!) 156/77 (!) 152/70  Pulse: 90 88 76 90  Resp: 15 16 18 16   Temp: 98.2 F (36.8 C) 98.4 F (36.9 C) 97.8 F (36.6 C) 98.5 F (36.9 C)  TempSrc: Oral Oral Oral Oral  SpO2: 98% 100% 98% 97%  Weight:      Height:       No intake or output data in the 24 hours ending 07/11/19 1704 Filed Weights   07/04/19 0902  Weight: 48.5 kg    Examination:  General exam: Appears calm and comfortable  Respiratory system: Clear to auscultation.  Cardiovascular system: S1 & S2 heard Gastrointestinal system: Abdomen is nondistended, soft and nontender. No organomegaly or masses felt. Normal bowel sounds heard. Central nervous system: Alert and oriented.  Patient moves all extremities.   Extremities: No leg edema.  Data Reviewed: I have personally reviewed following labs and imaging studies  CBC: Recent Labs  Lab 07/06/19 0203 07/06/19 0203 07/07/19 0309 07/07/19 1647 07/08/19 0819 07/09/19 1035 07/10/19 0826  WBC 12.7*  --  10.3  --  12.5* 10.8* 11.5*  NEUTROABS  --   --   --   --   --  7.6 7.9*  HGB 7.5*   < > 7.1* 8.5* 9.7* 8.9* 9.5*  HCT 24.0*   < > 23.5* 26.7* 31.1* 28.7* 30.7*  MCV 87.0  --  87.0  --  86.4 86.7 87.7  PLT 345  --  289  --  418* 409* 483*   < > = values in this interval not displayed.   Basic Metabolic Panel: Recent Labs  Lab 07/06/19 0203 07/07/19 0309 07/08/19 0819  NA 134* 135 138  K 3.8 3.8 4.1  CL 98 99 101  CO2 24 27 26   GLUCOSE 111* 121* 133*  BUN 19 13 12   CREATININE 0.82 0.70 0.74  CALCIUM 8.0* 7.9* 8.4*   GFR: Estimated Creatinine Clearance: 35.3 mL/min (by C-G formula based on SCr of 0.74 mg/dL). Liver Function Tests: No results for input(s): AST, ALT,  ALKPHOS, BILITOT, PROT, ALBUMIN in the last 168 hours. No results for input(s): LIPASE, AMYLASE in the last 168 hours. No results for input(s): AMMONIA in the last 168 hours. Coagulation Profile: No results for input(s): INR, PROTIME in the last 168 hours. Cardiac Enzymes: No results for input(s): CKTOTAL, CKMB, CKMBINDEX, TROPONINI in the last 168 hours. BNP (last 3 results) No results for input(s): PROBNP in the last 8760 hours. HbA1C: No results for input(s): HGBA1C in the last 72 hours. CBG: No results for input(s): GLUCAP in the last 168 hours. Lipid Profile: No results for input(s): CHOL, HDL,  LDLCALC, TRIG, CHOLHDL, LDLDIRECT in the last 72 hours. Thyroid Function Tests: No results for input(s): TSH, T4TOTAL, FREET4, T3FREE, THYROIDAB in the last 72 hours. Anemia Panel: No results for input(s): VITAMINB12, FOLATE, FERRITIN, TIBC, IRON, RETICCTPCT in the last 72 hours. Sepsis Labs: No results for input(s): PROCALCITON, LATICACIDVEN in the last 168 hours.  Recent Results (from the past 240 hour(s))  SARS Coronavirus 2 by RT PCR (hospital order, performed in [facility] hospital lab) Nasopharyngeal Nasopharyngeal Swab     Status: None   Collection Time: 07/04/19  5:03 AM   Specimen: Nasopharyngeal Swab  Result Value Ref Range Status   SARS Coronavirus 2 NEGATIVE NEGATIVE Final    Comment: (NOTE) SARS-CoV-2 target nucleic acids are NOT DETECTED.  The SARS-CoV-2 RNA is generally detectable in upper and lower respiratory specimens during the acute phase of infection. The lowest concentration of SARS-CoV-2 viral copies this assay can detect is 250 copies / mL. A negative result does not preclude SARS-CoV-2 infection and should not be used as the sole basis for treatment or other patient management decisions.  A negative result may occur with improper specimen collection / handling, submission of specimen other than nasopharyngeal swab, presence of viral mutation(s) within  the areas targeted by this assay, and inadequate number of viral copies (<250 copies / mL). A negative result must be combined with clinical observations, patient history, and epidemiological information.  Fact Sheet for Patients:   BoilerBrush.com.cy  Fact Sheet for Healthcare Providers: https://pope.com/  This test is not yet approved or  cleared by the [place] FDA and has been authorized for detection and/or diagnosis of SARS-CoV-2 by FDA under an Emergency Use Authorization (EUA).  This EUA will remain in effect (meaning this test can be used) for the duration of the COVID-19 declaration under Section 564(b)(1) of the Act, 21 U.S.C. section 360bbb-3(b)(1), unless the authorization is terminated or revoked sooner.  Performed at [facility], [address] [place], [place] [address]   Surgical pcr screen     Status: None   Collection Time: 07/04/19 11:14 AM   Specimen: Nasal Mucosa; Nasal Swab  Result Value Ref Range Status   MRSA, PCR NEGATIVE NEGATIVE Final   Staphylococcus aureus NEGATIVE NEGATIVE Final    Comment: (NOTE) The Xpert SA Assay (FDA approved for NASAL specimens in patients 80 years of age and older), is one component of a comprehensive surveillance program. It is not intended to diagnose infection nor to guide or monitor treatment. Performed at [facility], [address] [place], [place] [address]   SARS Coronavirus 2 by RT PCR (hospital order, performed in [facility] hospital lab) Nasopharyngeal Nasopharyngeal Swab     Status: None   Collection Time: 07/07/19 12:29 PM   Specimen: Nasopharyngeal Swab  Result Value Ref Range Status   SARS Coronavirus 2 NEGATIVE NEGATIVE Final    Comment: (NOTE) SARS-CoV-2 target nucleic acids are NOT DETECTED.  The SARS-CoV-2 RNA is generally detectable in upper and lower respiratory specimens during the acute phase of infection. The  lowest concentration of SARS-CoV-2 viral copies this assay can detect is 250 copies / mL. A negative result does not preclude SARS-CoV-2 infection and should not be used as the sole basis for treatment or other patient management decisions.  A negative result may occur with improper specimen collection / handling, submission of specimen other than nasopharyngeal swab, presence of viral mutation(s) within the areas targeted by this assay, and inadequate number of viral  copies (<250 copies / mL). A negative result must be combined with clinical observations, patient history, and epidemiological information.  Fact Sheet for Patients:   StrictlyIdeas.no  Fact Sheet for Healthcare Providers: BankingDealers.co.za  This test is not yet approved or  cleared by the [place] FDA and has been authorized for detection and/or diagnosis of SARS-CoV-2 by FDA under an Emergency Use Authorization (EUA).  This EUA will remain in effect (meaning this test can be used) for the duration of the COVID-19 declaration under Section 564(b)(1) of the Act, 21 U.S.C. section 360bbb-3(b)(1), unless the authorization is terminated or revoked sooner.  Performed at [facility], [place] [address] [place], [place] [address]       Radiology Studies: No results found.  Scheduled Meds: . aspirin  81 mg Oral BID WC  . docusate sodium  100 mg Oral BID  . feeding supplement (ENSURE ENLIVE)  237 mL Oral Q24H  . lisinopril  20 mg Oral Daily  . multivitamin with minerals  1 tablet Oral Daily  . pantoprazole  40 mg Oral Daily   Continuous Infusions:    LOS: 8 days   Time spent: 25 minutes   Bonnell Public, MD [facility]  07/11/2019, 5:04 PM   To contact the attending provider between 7A-7P or the covering provider during after hours 7P-7A, please log into the web site [email or web address].

## 2019-07-12 MED ORDER — ALPRAZOLAM 0.5 MG PO TABS
0.5000 mg | ORAL_TABLET | Freq: Once | ORAL | Status: AC
  Administered 2019-07-12: 0.5 mg via ORAL
  Filled 2019-07-12: qty 1

## 2019-07-12 NOTE — Progress Notes (Signed)
PROGRESS NOTE    [name]  WER:[id number] DOB: [birth year] DOA: 07/03/2019 PCP: Patient, No Pcp Per   Brief Narrative:  As per Dr. Hughie Closs "Sabrina Glass is a 86 y.o. female with history of hypertension was brought to the ER at [facility] at [facility] after patient had a fall. she slipped and fell when her leg gave way.  Did not hit her head or lose consciousness. x-rays in the ER showed left hip fracture and patient was transferred to [facility] for further management.  Labs over that showed anemia with hemoglobin around 9 mild leukocytosis chest x-ray showing chronic findings.  Patient underwent left hip arthroplasty on 07/05/2019".  07/12/2019: Patient seen.  No new changes.  Patient is awaiting disposition.   Assessment & Plan:   Principal Problem:   Closed left hip fracture, initial encounter [facility]) Active Problems:   Hip fracture (HCC)   Essential hypertension   Anemia   Pressure injury of skin  1. Left hip fracture status post mechanical fall: Status post total left hip arthroplasty, anterior approach by Dr. Linna Caprice on 07/05/2019. Pain is controlled. Management per orthopedics. 07/11/2019: Pursue disposition.  2. Essential hypertension: Blood pressure very well controlled.  Continue lisinopril and as needed hydralazine. 07/11/2019: Continue to optimize blood pressure.  Goal blood pressure should be less than 130/80 mmHg.  Adequate analgesia.  3. Acute blood loss anemia: Hemoglobin dropped from 9.7>7.5> 7.1 07/07/2019.  Received 1 unit of PRBC transfusion.  Posttransfusion hemoglobin today is 9.7> 8.9> 9.5.  She is stable without any symptoms. 07/11/2019: H&H is stable.  4. Leukocytosis could be reactionary patient is afebrile.  Closely monitor.  This had resolved but now she has mild leukocytosis again but again she remains afebrile without any signs or symptoms of infection. 07/11/2019: Continue to monitor WBC.  No signs of infection.  5. Mild  hypokalemia:  Resolved.  DVT prophylaxis: SCDs Start: 07/05/19 1054 and aspirin 81 p.o. twice daily per orthopedics   Code Status: Full Code  Family Communication: None present at bedside.  Plan of care discussed with patient in length and he verbalized understanding and agreed with it.  Status is: Inpatient  Remains inpatient appropriate because:Inpatient level of care appropriate due to severity of illness   Dispo: The patient is from: Home              Anticipated d/c is to: SNF              Anticipated d/c date is: 07/10/2019 if authorization obtained otherwise 07/11/2019              Patient currently is medically stable to d/c.  Patient has been stable for last 3 to 4 days awaiting insurance authorization to be transferred to SNF.   Estimated body mass index is 20.2 kg/m as calculated from the following:   Height as of this encounter: 5\' 1"  (1.549 m).   Weight as of this encounter: 48.5 kg.  Nutritional status:  Nutrition Problem: Increased nutrient needs Etiology: hip fracture, wound healing (Left hip fracture pending surgical intervention, stage II PI present on admission)   Signs/Symptoms: estimated needs   Interventions: Ensure Enlive (each supplement provides 350kcal and 20 grams of protein), Refer to RD note for recommendations    Consultants:   Orthopedics  Procedures:   Left total hip arthroplasty  Antimicrobials:  Anti-infectives (From admission, onward)   Start     Dose/Rate Route Frequency Ordered Stop   07/05/19 1100  ceFAZolin (  ANCEF) IVPB 2g/100 mL premix        2 g 200 mL/hr over 30 Minutes Intravenous Every 6 hours 07/05/19 1053 07/05/19 1754   07/04/19 0730  ceFAZolin (ANCEF) IVPB 2g/100 mL premix  Status:  Discontinued        2 g 200 mL/hr over 30 Minutes Intravenous On call to O.R. 07/04/19 0720 07/05/19 0559   07/04/19 0730  ceFAZolin (ANCEF) 3 g in dextrose 5 % 50 mL IVPB  Status:  Discontinued        3 g 100 mL/hr over 30 Minutes  Intravenous On call to O.R. 07/04/19 0720 07/04/19 0723         Subjective: Patient seen. No new complaints. No shortness of breath. No chest pain.  Objective: Vitals:   07/11/19 1910 07/12/19 0500 07/12/19 0800 07/12/19 1358  BP: (!) 148/88 (!) 142/84 (!) 156/82 (!) 146/86  Pulse: 63 66 95 (!) 107  Resp: 18 16 17 17   Temp: 98.3 F (36.8 C) 98.9 F (37.2 C) 97.9 F (36.6 C) 98 F (36.7 C)  TempSrc: Oral Axillary Oral Oral  SpO2: 98% 96% 97% 97%  Weight:      Height:       No intake or output data in the 24 hours ending 07/12/19 1617 Filed Weights   07/04/19 0902  Weight: 48.5 kg    Examination:  General exam: Appears calm and comfortable  Respiratory system: Clear to auscultation.  Cardiovascular system: S1 & S2 heard Gastrointestinal system: Abdomen is nondistended, soft and nontender. No organomegaly or masses felt. Normal bowel sounds heard. Central nervous system: Alert and oriented.  Patient moves all extremities.   Extremities: No leg edema.  Data Reviewed: I have personally reviewed following labs and imaging studies  CBC: Recent Labs  Lab 07/06/19 0203 07/06/19 0203 07/07/19 0309 07/07/19 1647 07/08/19 0819 07/09/19 1035 07/10/19 0826  WBC 12.7*  --  10.3  --  12.5* 10.8* 11.5*  NEUTROABS  --   --   --   --   --  7.6 7.9*  HGB 7.5*   < > 7.1* 8.5* 9.7* 8.9* 9.5*  HCT 24.0*   < > 23.5* 26.7* 31.1* 28.7* 30.7*  MCV 87.0  --  87.0  --  86.4 86.7 87.7  PLT 345  --  289  --  418* 409* 483*   < > = values in this interval not displayed.   Basic Metabolic Panel: Recent Labs  Lab 07/06/19 0203 07/07/19 0309 07/08/19 0819  NA 134* 135 138  K 3.8 3.8 4.1  CL 98 99 101  CO2 24 27 26   GLUCOSE 111* 121* 133*  BUN 19 13 12   CREATININE 0.82 0.70 0.74  CALCIUM 8.0* 7.9* 8.4*   GFR: Estimated Creatinine Clearance: 35.3 mL/min (by C-G formula based on SCr of 0.74 mg/dL). Liver Function Tests: No results for input(s): AST, ALT, ALKPHOS, BILITOT,  PROT, ALBUMIN in the last 168 hours. No results for input(s): LIPASE, AMYLASE in the last 168 hours. No results for input(s): AMMONIA in the last 168 hours. Coagulation Profile: No results for input(s): INR, PROTIME in the last 168 hours. Cardiac Enzymes: No results for input(s): CKTOTAL, CKMB, CKMBINDEX, TROPONINI in the last 168 hours. BNP (last 3 results) No results for input(s): PROBNP in the last 8760 hours. HbA1C: No results for input(s): HGBA1C in the last 72 hours. CBG: No results for input(s): GLUCAP in the last 168 hours. Lipid Profile: No results for input(s): CHOL, HDL, LDLCALC,  TRIG, CHOLHDL, LDLDIRECT in the last 72 hours. Thyroid Function Tests: No results for input(s): TSH, T4TOTAL, FREET4, T3FREE, THYROIDAB in the last 72 hours. Anemia Panel: No results for input(s): VITAMINB12, FOLATE, FERRITIN, TIBC, IRON, RETICCTPCT in the last 72 hours. Sepsis Labs: No results for input(s): PROCALCITON, LATICACIDVEN in the last 168 hours.  Recent Results (from the past 240 hour(s))  SARS Coronavirus 2 by RT PCR (hospital order, performed in [facility] hospital lab) Nasopharyngeal Nasopharyngeal Swab     Status: None   Collection Time: 07/04/19  5:03 AM   Specimen: Nasopharyngeal Swab  Result Value Ref Range Status   SARS Coronavirus 2 NEGATIVE NEGATIVE Final    Comment: (NOTE) SARS-CoV-2 target nucleic acids are NOT DETECTED.  The SARS-CoV-2 RNA is generally detectable in upper and lower respiratory specimens during the acute phase of infection. The lowest concentration of SARS-CoV-2 viral copies this assay can detect is 250 copies / mL. A negative result does not preclude SARS-CoV-2 infection and should not be used as the sole basis for treatment or other patient management decisions.  A negative result may occur with improper specimen collection / handling, submission of specimen other than nasopharyngeal swab, presence of viral mutation(s) within the areas targeted by  this assay, and inadequate number of viral copies (<250 copies / mL). A negative result must be combined with clinical observations, patient history, and epidemiological information.  Fact Sheet for Patients:   StrictlyIdeas.no  Fact Sheet for Healthcare Providers: BankingDealers.co.za  This test is not yet approved or  cleared by the [place] FDA and has been authorized for detection and/or diagnosis of SARS-CoV-2 by FDA under an Emergency Use Authorization (EUA).  This EUA will remain in effect (meaning this test can be used) for the duration of the COVID-19 declaration under Section 564(b)(1) of the Act, 21 U.S.C. section 360bbb-3(b)(1), unless the authorization is terminated or revoked sooner.  Performed at [facility], [place] [address] [place], [place] [address]   Surgical pcr screen     Status: None   Collection Time: 07/04/19 11:14 AM   Specimen: Nasal Mucosa; Nasal Swab  Result Value Ref Range Status   MRSA, PCR NEGATIVE NEGATIVE Final   Staphylococcus aureus NEGATIVE NEGATIVE Final    Comment: (NOTE) The Xpert SA Assay (FDA approved for NASAL specimens in patients 77 years of age and older), is one component of a comprehensive surveillance program. It is not intended to diagnose infection nor to guide or monitor treatment. Performed at [facility], [place] [address], [place], [place] [address]   SARS Coronavirus 2 by RT PCR (hospital order, performed in [facility] hospital lab) Nasopharyngeal Nasopharyngeal Swab     Status: None   Collection Time: 07/07/19 12:29 PM   Specimen: Nasopharyngeal Swab  Result Value Ref Range Status   SARS Coronavirus 2 NEGATIVE NEGATIVE Final    Comment: (NOTE) SARS-CoV-2 target nucleic acids are NOT DETECTED.  The SARS-CoV-2 RNA is generally detectable in upper and lower respiratory specimens during the acute phase of infection. The lowest concentration of SARS-CoV-2 viral  copies this assay can detect is 250 copies / mL. A negative result does not preclude SARS-CoV-2 infection and should not be used as the sole basis for treatment or other patient management decisions.  A negative result may occur with improper specimen collection / handling, submission of specimen other than nasopharyngeal swab, presence of viral mutation(s) within the areas targeted by this assay, and inadequate number of viral copies (<  250 copies / mL). A negative result must be combined with clinical observations, patient history, and epidemiological information.  Fact Sheet for Patients:   BoilerBrush.com.cy  Fact Sheet for Healthcare Providers: https://pope.com/  This test is not yet approved or  cleared by the [place] FDA and has been authorized for detection and/or diagnosis of SARS-CoV-2 by FDA under an Emergency Use Authorization (EUA).  This EUA will remain in effect (meaning this test can be used) for the duration of the COVID-19 declaration under Section 564(b)(1) of the Act, 21 U.S.C. section 360bbb-3(b)(1), unless the authorization is terminated or revoked sooner.  Performed at [facility], [address] [place], [place] [address]       Radiology Studies: No results found.  Scheduled Meds: . aspirin  81 mg Oral BID WC  . docusate sodium  100 mg Oral BID  . feeding supplement (ENSURE ENLIVE)  237 mL Oral Q24H  . lisinopril  20 mg Oral Daily  . multivitamin with minerals  1 tablet Oral Daily  . pantoprazole  40 mg Oral Daily   Continuous Infusions:    LOS: 9 days   Time spent: 15 minutes   Barnetta Chapel, MD [facility]  07/12/2019, 4:17 PM   To contact the attending provider between 7A-7P or the covering provider during after hours 7P-7A, please log into the web site [email or web address].

## 2019-07-12 NOTE — Plan of Care (Signed)

## 2019-07-13 LAB — SARS CORONAVIRUS 2 (TAT 6-24 HRS): SARS Coronavirus 2: NEGATIVE

## 2019-07-13 NOTE — TOC Progression Note (Signed)
Transition of Care [facility]) - Progression Note    Patient Details  Name: Sabrina Glass MRN: [id number] Date of Birth: January 29, 1927  [facility] CM/SW Contact  Epifanio Lesches, RN Phone Number: 07/13/2019, 8:06 AM  Clinical Narrative:     Ncm has f/u with [facility] regarding insurance authorization for SNF placement. Voice message left.Marland Kitchen awaiting call back. Updated COVID need, MD and bedside nurse made aware. TOC team will continue to monitor and follow.   Expected Discharge Plan: Skilled Nursing Facility ([facility]) Barriers to Discharge: Insurance Authorization  Expected Discharge Plan and Services Expected Discharge Plan: Skilled Nursing Facility [facility])         Expected Discharge Date: 07/08/19                                     Social Determinants of Health (SDOH) Interventions    Readmission Risk Interventions No flowsheet data found.

## 2019-07-13 NOTE — Progress Notes (Signed)
PROGRESS NOTE  Sabrina Glass QJJ:[id number] DOB: [birth year] DOA: 07/03/2019 PCP: Patient, No Pcp Per  HPI/Recap of past 24 hours: As per Dr. Daleen Bo Pahwani"Sabrina Glass a 86 y.o.femalewithhistory of hypertension was brought to the ER at [facility] at [facility] after patient had a fall. she slipped and fell when her leg gave way. Did not hit her head or lose consciousness. x-rays in the ER showed left hip fracture and patient was transferred to [facility] for further management. Labs over that showed anemia with hemoglobin around 9 mild leukocytosis chest x-ray showing chronic findings.  Patient underwent left hip arthroplasty on 07/05/2019".  07/13/2019: Seen and examined.  She has no complaints.  Awaiting disposition.   Assessment/Plan: Principal Problem:   Closed left hip fracture, initial encounter [facility]) Active Problems:   Hip fracture (HCC)   Essential hypertension   Anemia   Pressure injury of skin   Left hip fracture status post mechanical fall: Status post total left hip arthroplasty, anterior approach by Dr. Linna Caprice on 07/05/2019. Pain is controlled. Management per orthopedics. 07/11/2019: Pursue disposition.  Essential hypertension: Blood pressure very well controlled.  Continue lisinopril and as needed hydralazine.  Acute blood loss anemia: Hemoglobin dropped from 9.7>7.5> 7.1 07/07/2019.  Received 1 unit of PRBC transfusion.  Posttransfusion hemoglobin today is 9.7> 8.9> 9.5.  She is stable without any symptoms.  No overt bleeding.  Leukocytosis likely reactive patient is afebrile without any signs or symptoms of infection.  Resolved post repletion: Mild hypokalemia:   DVT prophylaxis: SCDs Start: 07/05/19 1054 and aspirin 81 p.o. twice daily per orthopedics   Code Status: Full Code  Family Communication: None present at bedside.  Plan of care discussed with patient in length and he verbalized understanding and agreed with it.  Status is:  Inpatient  Remains inpatient appropriate because:Inpatient level of care appropriate due to severity of illness   Dispo: The patient is from: Home  Anticipated d/c is to: SNF  Anticipated d/c date is: 07/14/2019 pending SNF placement.  Patient currently is medically stable to d/c.    Estimated body mass index is 20.2 kg/m as calculated from the following:   Height as of this encounter: 5\' 1"  (1.549 m).   Weight as of this encounter: 48.5 kg.  Nutritional status:  Nutrition Problem: Increased nutrient needs Etiology: hip fracture, wound healing (Left hip fracture pending surgical intervention, stage II PI present on admission)   Signs/Symptoms: estimated needs   Interventions: Ensure Enlive (each supplement provides 350kcal and 20 grams of protein), Refer to RD note for recommendations    Consultants:   Orthopedics  Procedures:   Left total hip arthroplasty          Objective: Vitals:   07/12/19 1358 07/12/19 1919 07/13/19 0300 07/13/19 0759  BP: (!) 146/86 127/83 127/77 (!) 148/96  Pulse: (!) 107 92 90 78  Resp: 17 19 18 16   Temp: 98 F (36.7 C) 98.3 F (36.8 C) 98.5 F (36.9 C) 98.7 F (37.1 C)  TempSrc: Oral Oral Axillary Oral  SpO2: 97% 97% 97% 97%  Weight:      Height:       No intake or output data in the 24 hours ending 07/13/19 0831 Filed Weights   07/04/19 0902  Weight: 48.5 kg    Exam:  . General: 86 y.o. year-old female well developed well nourished in no acute distress.  Alert and interactive. . Cardiovascular: Regular rate and rhythm with no rubs or gallops.  No thyromegaly  or JVD noted.   Marland Kitchen Respiratory: Clear to auscultation with no wheezes or rales. Good inspiratory effort. . Abdomen: Soft nontender nondistended with normal bowel sounds x4 quadrants. . Musculoskeletal: No lower extremity edema. 2/4 pulses in all 4 extremities. Marland Kitchen Psychiatry: Mood is appropriate for condition and  setting   Data Reviewed: CBC: Recent Labs  Lab 07/07/19 0309 07/07/19 1647 07/08/19 0819 07/09/19 1035 07/10/19 0826  WBC 10.3  --  12.5* 10.8* 11.5*  NEUTROABS  --   --   --  7.6 7.9*  HGB 7.1* 8.5* 9.7* 8.9* 9.5*  HCT 23.5* 26.7* 31.1* 28.7* 30.7*  MCV 87.0  --  86.4 86.7 87.7  PLT 289  --  418* 409* 737*   Basic Metabolic Panel: Recent Labs  Lab 07/07/19 0309 07/08/19 0819  NA 135 138  K 3.8 4.1  CL 99 101  CO2 27 26  GLUCOSE 121* 133*  BUN 13 12  CREATININE 0.70 0.74  CALCIUM 7.9* 8.4*   GFR: Estimated Creatinine Clearance: 35.3 mL/min (by C-G formula based on SCr of 0.74 mg/dL). Liver Function Tests: No results for input(s): AST, ALT, ALKPHOS, BILITOT, PROT, ALBUMIN in the last 168 hours. No results for input(s): LIPASE, AMYLASE in the last 168 hours. No results for input(s): AMMONIA in the last 168 hours. Coagulation Profile: No results for input(s): INR, PROTIME in the last 168 hours. Cardiac Enzymes: No results for input(s): CKTOTAL, CKMB, CKMBINDEX, TROPONINI in the last 168 hours. BNP (last 3 results) No results for input(s): PROBNP in the last 8760 hours. HbA1C: No results for input(s): HGBA1C in the last 72 hours. CBG: No results for input(s): GLUCAP in the last 168 hours. Lipid Profile: No results for input(s): CHOL, HDL, LDLCALC, TRIG, CHOLHDL, LDLDIRECT in the last 72 hours. Thyroid Function Tests: No results for input(s): TSH, T4TOTAL, FREET4, T3FREE, THYROIDAB in the last 72 hours. Anemia Panel: No results for input(s): VITAMINB12, FOLATE, FERRITIN, TIBC, IRON, RETICCTPCT in the last 72 hours. Urine analysis: No results found for: COLORURINE, APPEARANCEUR, LABSPEC, PHURINE, GLUCOSEU, HGBUR, BILIRUBINUR, KETONESUR, PROTEINUR, UROBILINOGEN, NITRITE, LEUKOCYTESUR Sepsis Labs: @LABRCNTIP (procalcitonin:4,lacticidven:4)  ) Recent Results (from the past 240 hour(s))  SARS Coronavirus 2 by RT PCR (hospital order, performed in [facility] hospital  lab) Nasopharyngeal Nasopharyngeal Swab     Status: None   Collection Time: 07/04/19  5:03 AM   Specimen: Nasopharyngeal Swab  Result Value Ref Range Status   SARS Coronavirus 2 NEGATIVE NEGATIVE Final    Comment: (NOTE) SARS-CoV-2 target nucleic acids are NOT DETECTED.  The SARS-CoV-2 RNA is generally detectable in upper and lower respiratory specimens during the acute phase of infection. The lowest concentration of SARS-CoV-2 viral copies this assay can detect is 250 copies / mL. A negative result does not preclude SARS-CoV-2 infection and should not be used as the sole basis for treatment or other patient management decisions.  A negative result may occur with improper specimen collection / handling, submission of specimen other than nasopharyngeal swab, presence of viral mutation(s) within the areas targeted by this assay, and inadequate number of viral copies (<250 copies / mL). A negative result must be combined with clinical observations, patient history, and epidemiological information.  Fact Sheet for Patients:   StrictlyIdeas.no  Fact Sheet for Healthcare Providers: BankingDealers.co.za  This test is not yet approved or  cleared by the [place] FDA and has been authorized for detection and/or diagnosis of SARS-CoV-2 by FDA under an Emergency Use Authorization (EUA).  This EUA will remain  in effect (meaning this test can be used) for the duration of the COVID-19 declaration under Section 564(b)(1) of the Act, 21 U.S.C. section 360bbb-3(b)(1), unless the authorization is terminated or revoked sooner.  Performed at [facility], [address] [place], [place] [address]   Surgical pcr screen     Status: None   Collection Time: 07/04/19 11:14 AM   Specimen: Nasal Mucosa; Nasal Swab  Result Value Ref Range Status   MRSA, PCR NEGATIVE NEGATIVE Final   Staphylococcus aureus NEGATIVE NEGATIVE Final    Comment:  (NOTE) The Xpert SA Assay (FDA approved for NASAL specimens in patients 78 years of age and older), is one component of a comprehensive surveillance program. It is not intended to diagnose infection nor to guide or monitor treatment. Performed at [facility], [address] [place], [place] [address]   SARS Coronavirus 2 by RT PCR (hospital order, performed in [facility] hospital lab) Nasopharyngeal Nasopharyngeal Swab     Status: None   Collection Time: 07/07/19 12:29 PM   Specimen: Nasopharyngeal Swab  Result Value Ref Range Status   SARS Coronavirus 2 NEGATIVE NEGATIVE Final    Comment: (NOTE) SARS-CoV-2 target nucleic acids are NOT DETECTED.  The SARS-CoV-2 RNA is generally detectable in upper and lower respiratory specimens during the acute phase of infection. The lowest concentration of SARS-CoV-2 viral copies this assay can detect is 250 copies / mL. A negative result does not preclude SARS-CoV-2 infection and should not be used as the sole basis for treatment or other patient management decisions.  A negative result may occur with improper specimen collection / handling, submission of specimen other than nasopharyngeal swab, presence of viral mutation(s) within the areas targeted by this assay, and inadequate number of viral copies (<250 copies / mL). A negative result must be combined with clinical observations, patient history, and epidemiological information.  Fact Sheet for Patients:   BoilerBrush.com.cy  Fact Sheet for Healthcare Providers: https://pope.com/  This test is not yet approved or  cleared by the [place] FDA and has been authorized for detection and/or diagnosis of SARS-CoV-2 by FDA under an Emergency Use Authorization (EUA).  This EUA will remain in effect (meaning this test can be used) for the duration of the COVID-19 declaration under Section 564(b)(1) of the Act, 21 U.S.C. section  360bbb-3(b)(1), unless the authorization is terminated or revoked sooner.  Performed at [facility], [address] [place], [place] [address]       Studies: No results found.  Scheduled Meds: . aspirin  81 mg Oral BID WC  . docusate sodium  100 mg Oral BID  . feeding supplement (ENSURE ENLIVE)  237 mL Oral Q24H  . lisinopril  20 mg Oral Daily  . multivitamin with minerals  1 tablet Oral Daily  . pantoprazole  40 mg Oral Daily    Continuous Infusions:   LOS: 10 days     Darlin Drop, MD [facility] Pager [phone]  If 7PM-7AM, please contact night-coverage [email or web address] Password [facility] 07/13/2019, 8:31 AM

## 2019-07-13 NOTE — Progress Notes (Signed)
Physical Therapy Treatment Patient Details Name: Sabrina Glass MRN: [id number] DOB: 27-Jan-1927 Today's Date: 07/13/2019    History of Present Illness Patient is a 86 y/o female who presents with left hip fx s/p fall now s/p left THA 07/05/19. PMH includes HTN.    PT Comments    Patient progressing slowly towards PT goals. Reports pain in left hip today especially with mobility. Requires Min A for standing and gait training with use of RW. Continues to be limited by pain and fatigue. Cognition seems to be improving but pt still needs cues to be redirected to stay on task. Eager to be d/ced today. Will follow.   Follow Up Recommendations  SNF;Supervision for mobility/OOB     Equipment Recommendations  None recommended by PT    Recommendations for Other Services       Precautions / Restrictions Precautions Precautions: Fall Restrictions Weight Bearing Restrictions: Yes LLE Weight Bearing: Weight bearing as tolerated    Mobility  Bed Mobility               General bed mobility comments: Up in chair upon PT arrival.  Transfers Overall transfer level: Needs assistance Equipment used: Rolling walker (2 wheeled) Transfers: Sit to/from Stand Sit to Stand: Min assist         General transfer comment: assist to power to standing from chair x2, from [facility] x1.  Ambulation/Gait Ambulation/Gait assistance: Min assist Gait Distance (Feet): 15 Feet (+10' + 12') Assistive device: Rolling walker (2 wheeled) Gait Pattern/deviations: Step-through pattern;Antalgic;Trunk flexed;Decreased stride length;Decreased step length - right;Decreased step length - left Gait velocity: decreased   General Gait Details: Slow, unsteady and antalgic like gait with RW for support; kyphotic posturing. 2 seated rest breaks due to pain   Stairs             Wheelchair Mobility    Modified Rankin (Stroke Patients Only)       Balance Overall balance assessment: Needs  assistance Sitting-balance support: Feet supported;No upper extremity supported Sitting balance-Leahy Scale: Fair     Standing balance support: During functional activity Standing balance-Leahy Scale: Poor Standing balance comment: Requires Ue support and external support; total A for pericare                            Cognition Arousal/Alertness: Awake/alert Behavior During Therapy: WFL for tasks assessed/performed Overall Cognitive Status: No family/caregiver present to determine baseline cognitive functioning                                 General Comments: Pt tangential with speech. Follows commands. needs to be redirected frequently.      Exercises      General Comments        Pertinent Vitals/Pain Pain Assessment: 0-10 Pain Score: 10-Worst pain ever Pain Location: left hip with movement Pain Descriptors / Indicators: Operative site guarding;Sore;Guarding;Grimacing Pain Intervention(s): Monitored during session;Repositioned;Limited activity within patient's tolerance    Home Living                      Prior Function            PT Goals (current goals can now be found in the care plan section) Progress towards PT goals: Progressing toward goals    Frequency    Min 3X/week      PT Plan Current plan remains appropriate  Co-evaluation              AM-PAC PT "6 Clicks" Mobility   Outcome Measure  Help needed turning from your back to your side while in a flat bed without using bedrails?: A Little Help needed moving from lying on your back to sitting on the side of a flat bed without using bedrails?: A Lot Help needed moving to and from a bed to a chair (including a wheelchair)?: A Little Help needed standing up from a chair using your arms (e.g., wheelchair or bedside chair)?: A Little Help needed to walk in hospital room?: A Little Help needed climbing 3-5 steps with a railing? : Total 6 Click Score: 15     End of Session Equipment Utilized During Treatment: Gait belt Activity Tolerance: Patient tolerated treatment well Patient left: in chair;with call bell/phone within reach;with chair alarm set Nurse Communication: Mobility status PT Visit Diagnosis: Pain;Muscle weakness (generalized) (M62.81);Unsteadiness on feet (R26.81);Difficulty in walking, not elsewhere classified (R26.2) Pain - Right/Left: Left Pain - part of body: Hip     Time: 5102-5852 PT Time Calculation (min) (ACUTE ONLY): 23 min  Charges:  $Gait Training: 8-22 mins $Therapeutic Activity: 8-22 mins                     Vale Haven, PT, DPT Acute Rehabilitation Services Pager [phone] Office [phone]       Blake Divine A Lanier Ensign 07/13/2019, 12:27 PM

## 2019-07-13 NOTE — Plan of Care (Signed)

## 2019-07-13 NOTE — Plan of Care (Addendum)
Unaware 2047 BP was 174/70 Retake @2328  BP was 151/71  Will continue to monitor     Problem: Education: Goal: Knowledge of General Education information will improve Description: Including pain rating scale, medication(s)/side effects and non-pharmacologic comfort measures Outcome: Progressing   Problem: Activity: Goal: Risk for activity intolerance will decrease Outcome: Progressing   Problem: Pain Managment: Goal: General experience of comfort will improve Outcome: Progressing   Problem: Safety: Goal: Ability to remain free from injury will improve Outcome: Progressing   Problem: Skin Integrity: Goal: Risk for impaired skin integrity will decrease Outcome: Progressing

## 2019-07-13 NOTE — TOC Progression Note (Signed)
Transition of Care [facility]) - Progression Note    Patient Details  Name: Sabrina Glass MRN: [id number] Date of Birth: April 08, 1927  Transition of Care [facility] CM/SW Contact  Epifanio Lesches, RN Phone Number: 07/13/2019, 2:33 PM  Clinical Narrative:    NCM received call from [facility] admission liaison requesting updated therapy notes. NCM faxed updated PT notes to [phone] as request.  TOC will continue to monitor and follow ....   Expected Discharge Plan: Skilled Nursing Facility Barriers to Discharge: Insurance Authorization  Expected Discharge Plan and Services Expected Discharge Plan: Skilled Nursing Facility         Expected Discharge Date: 07/08/19                                     Social Determinants of Health (SDOH) Interventions    Readmission Risk Interventions No flowsheet data found.

## 2019-07-14 NOTE — TOC Progression Note (Signed)
Transition of Care [facility]) - Progression Note    Patient Details  Name: Sabrina Glass MRN: [id number] Date of Birth: 11/19/1927  [facility]) CM/SW Contact  Epifanio Lesches, RN Phone Number: [phone] 07/14/2019, 9:33 AM  Clinical Narrative:    Still awaiting insurance authorization for SNF placement. Updated PT notes requested and sent to insurance by [facility] admissions on yesterday. Admission liaison to f/u with NCM today. Updated COVID noted for 07/13/2019.  TOC team will continue to monitor and follow ....   Expected Discharge Plan: Skilled Nursing Facility ([facility]) Barriers to Discharge: Insurance Authorization  Expected Discharge Plan and Services Expected Discharge Plan: Skilled Nursing Facility [facility])         Expected Discharge Date: 07/14/19                                     Social Determinants of Health (SDOH) Interventions    Readmission Risk Interventions No flowsheet data found.

## 2019-07-14 NOTE — Plan of Care (Signed)

## 2019-07-14 NOTE — Discharge Summary (Signed)
Physician Discharge Summary  Sabrina Glass AVW:[id number] DOB: May 31, 1927 DOA: 07/03/2019  PCP: Patient, No Pcp Per  Admit date: 07/03/2019 Discharge date: 07/14/2019  Admitted From: home Discharge disposition: [facility]   Recommendations for Outpatient Follow-Up:   1. Discharge to facility 2. Follow up with ortho 2 weeks   Discharge Diagnosis:   Principal Problem:   Closed left hip fracture, initial encounter [facility]) Active Problems:   Hip fracture (HCC)   Essential hypertension   Anemia   Pressure injury of skin    Discharge Condition: Improved.  Diet recommendation  Regular.  Wound care: remove staples 6/23  Code status: Full.   History of Present Illness:   Sabrina Glass 86 year old female with a history of hypertension brought to the emergency department at [facility] in [place] after a mechanical fall.  She did not hit her head or lose consciousness.  X-rays in the ER showed left hip fracture and the patient was transferred to [facility] for further management.  Work-up revealed anemia with a hemoglobin around 9, mild leukocytosis, chest x-ray showing chronic findings.  Evaluated by orthopedics and underwent surgical repair on June 13   Hospital Course by Problem:   Left hip fracture status post mechanical fall: Status post total left hip arthroplasty, anterior approach by Dr. Linna Caprice on 07/05/2019. Pain is controlled. Management per orthopedics. 07/11/2019: Being discharged to nursing facility.  Staples to be removed June 23.  Follow-up with orthopedics 2 weeks  Essential hypertension: Blood pressure very well controlled. Continue lisinopril and as needed hydralazine.  Acute blood loss anemia: Hemoglobin dropped from 9.7>7.5>7.1 07/07/2019. Received 1 unit of PRBC transfusion. Posttransfusion hemoglobin today is 9.7>8.9>9.5. She is stable without any symptoms.  No overt bleeding.  Leukocytosis likely reactive patient is  afebrile without any signs or symptoms of infection.  Resolved post repletion: Mild hypokalemia:      Medical Consultants:    ortho  Discharge Exam:   Vitals:   07/14/19 0421 07/14/19 0812  BP: (!) 168/76 (!) 148/73  Pulse: 81 75  Resp: 20 17  Temp: 98.5 F (36.9 C) 97.9 F (36.6 C)  SpO2: 98% 96%   Vitals:   07/13/19 2047 07/13/19 2328 07/14/19 0421 07/14/19 0812  BP: (!) 174/70 (!) 151/71 (!) 168/76 (!) 148/73  Pulse: 93 76 81 75  Resp: 18  20 17   Temp: 98.3 F (36.8 C)  98.5 F (36.9 C) 97.9 F (36.6 C)  TempSrc: Oral  Oral Oral  SpO2: 97%  98% 96%  Weight:      Height:        General exam: Appears calm and comfortable. No acute distress Respiratory system: Clear to auscultation. Respiratory effort normal. Cardiovascular system: S1 & S2 heard, RRR. No JVD,  rubs, gallops or clicks. No murmurs. Gastrointestinal system: Abdomen is nondistended, soft and nontender. No organomegaly or masses felt. Normal bowel sounds heard. Central nervous system: Alert and oriented. No focal neurological deficits. Extremities: No clubbing,  or cyanosis. No edema. Skin: No rashes, lesions or ulcers. Psychiatry: Judgement and insight appear normal. Mood & affect appropriate.    The results of significant diagnostics from this hospitalization (including imaging, microbiology, ancillary and laboratory) are listed below for reference.     Procedures and Diagnostic Studies:   DG Knee Left Port  Result Date: 07/04/2019 CLINICAL DATA:  Hip fracture.  Left knee pain. EXAM: PORTABLE LEFT KNEE - 1-2 VIEW COMPARISON:  None. FINDINGS: No evidence of fracture, dislocation, or  joint effusion. No evidence of arthropathy or other focal bone abnormality. Soft tissues are unremarkable. IMPRESSION: Negative. Electronically Signed   By: Ulyses Jarred M.D.   On: 07/04/2019 06:38   DG HIP UNILAT WITH PELVIS 2-3 VIEWS LEFT  Result Date: 07/04/2019 CLINICAL DATA:  Hip fracture EXAM: DG HIP (WITH  OR WITHOUT PELVIS) 2-3V LEFT COMPARISON:  None. FINDINGS: There is a mildly displaced medially angulated fracture of the left femoral neck. No dislocation. No other pelvic fracture. There is moderate left hip osteoarthrosis. IMPRESSION: Mildly displaced, medially angulated fracture of the left femoral neck. Electronically Signed   By: Ulyses Jarred M.D.   On: 07/04/2019 06:35     Labs:   Basic Metabolic Panel: Recent Labs  Lab 07/08/19 0819  NA 138  K 4.1  CL 101  CO2 26  GLUCOSE 133*  BUN 12  CREATININE 0.74  CALCIUM 8.4*   GFR Estimated Creatinine Clearance: 35.3 mL/min (by C-G formula based on SCr of 0.74 mg/dL). Liver Function Tests: No results for input(s): AST, ALT, ALKPHOS, BILITOT, PROT, ALBUMIN in the last 168 hours. No results for input(s): LIPASE, AMYLASE in the last 168 hours. No results for input(s): AMMONIA in the last 168 hours. Coagulation profile No results for input(s): INR, PROTIME in the last 168 hours.  CBC: Recent Labs  Lab 07/07/19 1647 07/08/19 0819 07/09/19 1035 07/10/19 0826  WBC  --  12.5* 10.8* 11.5*  NEUTROABS  --   --  7.6 7.9*  HGB 8.5* 9.7* 8.9* 9.5*  HCT 26.7* 31.1* 28.7* 30.7*  MCV  --  86.4 86.7 87.7  PLT  --  418* 409* 483*   Cardiac Enzymes: No results for input(s): CKTOTAL, CKMB, CKMBINDEX, TROPONINI in the last 168 hours. BNP: Invalid input(s): POCBNP CBG: No results for input(s): GLUCAP in the last 168 hours. D-Dimer No results for input(s): DDIMER in the last 72 hours. Hgb A1c No results for input(s): HGBA1C in the last 72 hours. Lipid Profile No results for input(s): CHOL, HDL, LDLCALC, TRIG, CHOLHDL, LDLDIRECT in the last 72 hours. Thyroid function studies No results for input(s): TSH, T4TOTAL, T3FREE, THYROIDAB in the last 72 hours.  Invalid input(s): FREET3 Anemia work up No results for input(s): VITAMINB12, FOLATE, FERRITIN, TIBC, IRON, RETICCTPCT in the last 72 hours. Microbiology Recent Results (from the past  240 hour(s))  Surgical pcr screen     Status: None   Collection Time: 07/04/19 11:14 AM   Specimen: Nasal Mucosa; Nasal Swab  Result Value Ref Range Status   MRSA, PCR NEGATIVE NEGATIVE Final   Staphylococcus aureus NEGATIVE NEGATIVE Final    Comment: (NOTE) The Xpert SA Assay (FDA approved for NASAL specimens in patients 20 years of age and older), is one component of a comprehensive surveillance program. It is not intended to diagnose infection nor to guide or monitor treatment. Performed at [facility], [place] [address] [place], [place] [address]   SARS Coronavirus 2 by RT PCR (hospital order, performed in [facility] hospital lab) Nasopharyngeal Nasopharyngeal Swab     Status: None   Collection Time: 07/07/19 12:29 PM   Specimen: Nasopharyngeal Swab  Result Value Ref Range Status   SARS Coronavirus 2 NEGATIVE NEGATIVE Final    Comment: (NOTE) SARS-CoV-2 target nucleic acids are NOT DETECTED.  The SARS-CoV-2 RNA is generally detectable in upper and lower respiratory specimens during the acute phase of infection. The lowest concentration of SARS-CoV-2 viral copies this assay can detect is 250 copies / mL.  A negative result does not preclude SARS-CoV-2 infection and should not be used as the sole basis for treatment or other patient management decisions.  A negative result may occur with improper specimen collection / handling, submission of specimen other than nasopharyngeal swab, presence of viral mutation(s) within the areas targeted by this assay, and inadequate number of viral copies (<250 copies / mL). A negative result must be combined with clinical observations, patient history, and epidemiological information.  Fact Sheet for Patients:   BoilerBrush.com.cy  Fact Sheet for Healthcare Providers: https://pope.com/  This test is not yet approved or  cleared by the [place] FDA and has been authorized for  detection and/or diagnosis of SARS-CoV-2 by FDA under an Emergency Use Authorization (EUA).  This EUA will remain in effect (meaning this test can be used) for the duration of the COVID-19 declaration under Section 564(b)(1) of the Act, 21 U.S.C. section 360bbb-3(b)(1), unless the authorization is terminated or revoked sooner.  Performed at [facility], [address] [place], [place] [address]   SARS CORONAVIRUS 2 (TAT 6-24 HRS) Nasopharyngeal Nasopharyngeal Swab     Status: None   Collection Time: 07/13/19 12:11 PM   Specimen: Nasopharyngeal Swab  Result Value Ref Range Status   SARS Coronavirus 2 NEGATIVE NEGATIVE Final    Comment: (NOTE) SARS-CoV-2 target nucleic acids are NOT DETECTED.  The SARS-CoV-2 RNA is generally detectable in upper and lower respiratory specimens during the acute phase of infection. Negative results do not preclude SARS-CoV-2 infection, do not rule out co-infections with other pathogens, and should not be used as the sole basis for treatment or other patient management decisions. Negative results must be combined with clinical observations, patient history, and epidemiological information. The expected result is Negative.  Fact Sheet for Patients: HairSlick.no  Fact Sheet for Healthcare Providers: quierodirigir.com  This test is not yet approved or cleared by the [place] FDA and  has been authorized for detection and/or diagnosis of SARS-CoV-2 by FDA under an Emergency Use Authorization (EUA). This EUA will remain  in effect (meaning this test can be used) for the duration of the COVID-19 declaration under Se ction 564(b)(1) of the Act, 21 U.S.C. section 360bbb-3(b)(1), unless the authorization is terminated or revoked sooner.  Performed at [facility], [address] [place], [place] [address]      Discharge Instructions:    Allergies as of 07/14/2019       Reactions   Codeine Nausea And Vomiting, Swelling, Rash   Throat swelling (pt can take percocet)   Penicillins Nausea And Vomiting, Swelling, Rash   Throat swelling   Sulfa Antibiotics Nausea And Vomiting, Swelling, Rash   Throat swelling      Medication List    STOP taking these medications   oxyCODONE-acetaminophen 10-325 MG tablet Commonly known as: PERCOCET     TAKE these medications   ALPRAZolam 0.5 MG tablet Commonly known as: XANAX Take 1 tablet (0.5 mg total) by mouth daily as needed for anxiety or sleep. What changed:   when to take this  reasons to take this   aspirin 81 MG chewable tablet Chew 1 tablet (81 mg total) by mouth 2 (two) times daily with a meal.   Butalbital-APAP-Caffeine 50-300-40 MG Caps Take 1 tablet by mouth 4 (four) times daily as needed for migraine.   diclofenac Sodium 1 % Gel Commonly known as: VOLTAREN Apply 1 application topically 2 (two) times daily.   ferrous sulfate 325 (65 FE) MG  tablet Take 325 mg by mouth daily with breakfast.   fluticasone 50 MCG/ACT nasal spray Commonly known as: FLONASE Place 1 spray into both nostrils daily as needed for rhinitis.   HYDROcodone-acetaminophen 5-325 MG tablet Commonly known as: NORCO/VICODIN Take 1 tablet by mouth every 4 (four) hours as needed for moderate pain.   ibuprofen 200 MG tablet Commonly known as: ADVIL Take 200 mg by mouth daily as needed for fever or headache (pain).   lisinopril 20 MG tablet Commonly known as: ZESTRIL Take 20 mg by mouth daily.   pantoprazole 40 MG tablet Commonly known as: PROTONIX Take 40 mg by mouth at bedtime.   polyethylene glycol 17 g packet Commonly known as: MIRALAX / GLYCOLAX Take 17 g by mouth daily as needed (constipation).   promethazine 25 MG tablet Commonly known as: PHENERGAN Take 25 mg by mouth every 6 (six) hours as needed for nausea or vomiting.   Vitamin D (Ergocalciferol) 1.25 MG (50000 UNIT) Caps capsule Commonly known as:  DRISDOL Take 50,000 Units by mouth every Wednesday.       Follow-up Information    Swinteck, Arlys John, MD. Schedule an appointment as soon as possible for a visit in 2 week(s).   Specialty: Orthopedic Surgery Contact information: [address] [place] [address] [place] [address] [phone]                Time coordinating discharge: 40 minutes  Signed:  Gwenyth Bender NP  [facility] 07/14/2019, 9:41 AM

## 2019-07-14 NOTE — Progress Notes (Signed)
Nutrition Follow-up  DOCUMENTATION CODES:   Not applicable  INTERVENTION:   -Continue Ensure Enlive po daily, each supplement provides 350 kcal and 20 grams of protein -Continue Magic cup BID with meals, each supplement provides 290 kcal and 9 grams of protein -Continue MVI with minerals daily  NUTRITION DIAGNOSIS:   Increased nutrient needs related to hip fracture, wound healing (Left hip fracture pending surgical intervention, stage II PI present on admission) as evidenced by estimated needs.  Ongoing  GOAL:   Patient will meet greater than or equal to 90% of their needs  Progressing   MONITOR:   Labs, Supplement acceptance, PO intake, Weight trends  REASON FOR ASSESSMENT:   Consult Hip fracture protocol  ASSESSMENT:   86 year old female with past medical history of HTN and anemia transferred from [facility] [place] for further management of left hip fracture after a fall at home.  6/13- s/p PROCEDURE:Lefttotal hip arthroplasty, anterior approach.  Reviewed I/O's: +360 ml x 24 hours and +1.4 L x 24 hours  Attempted to speak with pt via phone, however, no answer.   Pt remains with good appetite. Noted meal completion 25-100%. She is on a regular diet; she does not have teeth, but prefers to choose menu items that she thinks she can tolerate.   Medications reviewed and include colace.   Per TOC team notes, pt medically stable for discharge; awaiting SNF placement.   Labs reviewed.   Diet Order:   Diet Order            Diet regular Room service appropriate? Yes; Fluid consistency: Thin  Diet effective now                 EDUCATION NEEDS:   No education needs have been identified at this time  Skin:  Skin Assessment: Skin Integrity Issues: Skin Integrity Issues:: Incisions, Stage II Stage II: - Incisions: lt hip  Last BM:  07/14/19  Height:   Ht Readings from Last 1 Encounters:  07/04/19 5\' 1"  (1.549 m)    Weight:   Wt Readings  from Last 1 Encounters:  07/04/19 48.5 kg    Ideal Body Weight:  47.7 kg  BMI:  Body mass index is 20.2 kg/m.  Estimated Nutritional Needs:   Kcal:  1450-1650  Protein:  65-80 grams  Fluid:  > 1.4 L    09/03/19, RD, LDN, CDCES Registered Dietitian II Certified Diabetes Care and Education Specialist Please refer to [facility] for RD and/or RD on-call/weekend/after hours pager

## 2019-07-15 NOTE — Care Management Important Message (Signed)
Important Message  Patient Details  Name: Sabrina Glass MRN: [id number] Date of Birth: 1927/12/11   Medicare Important Message Given:  Yes     Almin Livingstone 07/15/2019, 11:12 AM

## 2019-07-15 NOTE — Plan of Care (Signed)

## 2019-07-15 NOTE — Progress Notes (Signed)
Physical Therapy Treatment Patient Details Name: Sabrina Glass MRN: [id number] DOB: [birth year] Today's Date: 07/15/2019    History of Present Illness Patient is a 86 y/o female who presents with left hip fx s/p fall now s/p left THA 07/05/19. PMH includes HTN.    PT Comments     Pt making slow progress towards physical therapy goals, remains motivated to participate. Requiring min assist for functional mobility. Able to participate in bed level exercises and ambulating 16 feet with a walker utilizing a step to pattern. Further mobility limited due to pt wanting to eat her breakfast. Pt continues to present as a high fall risk based on decreased gait speed and balance impairments. Continue to recommend SNF for ongoing Physical Therapy.      Follow Up Recommendations  SNF;Supervision for mobility/OOB     Equipment Recommendations  None recommended by PT    Recommendations for Other Services       Precautions / Restrictions Precautions Precautions: Fall Restrictions Weight Bearing Restrictions: Yes LLE Weight Bearing: Weight bearing as tolerated    Mobility  Bed Mobility Overal bed mobility: Needs Assistance Bed Mobility: Supine to Sit     Supine to sit: Min guard     General bed mobility comments: Min guard for safety, no physical assist required  Transfers Overall transfer level: Needs assistance Equipment used: Rolling walker (2 wheeled) Transfers: Sit to/from Stand Sit to Stand: Min assist         General transfer comment: MinA to rise to standing from edge of bed  Ambulation/Gait Ambulation/Gait assistance: Min assist Gait Distance (Feet): 16 Feet Assistive device: Rolling walker (2 wheeled) Gait Pattern/deviations: Antalgic;Trunk flexed;Decreased stride length;Step-to pattern;Decreased step length - right;Decreased weight shift to left Gait velocity: decreased   General Gait Details: Cues for shorter L step length and controlling pace on turns, pt  utilizing step to pattern. MinA for stability. Kyphotic posturing.   Stairs             Wheelchair Mobility    Modified Rankin (Stroke Patients Only)       Balance Overall balance assessment: Needs assistance Sitting-balance support: Feet supported;No upper extremity supported Sitting balance-Leahy Scale: Fair     Standing balance support: During functional activity;Bilateral upper extremity supported Standing balance-Leahy Scale: Poor                              Cognition Arousal/Alertness: Awake/alert Behavior During Therapy: WFL for tasks assessed/performed Overall Cognitive Status: No family/caregiver present to determine baseline cognitive functioning Area of Impairment: Memory                     Memory: Decreased short-term memory                Exercises General Exercises - Lower Extremity Heel Slides: Both;10 reps;Supine Straight Leg Raises: Both;5 reps;Supine    General Comments        Pertinent Vitals/Pain Pain Assessment: Faces Faces Pain Scale: Hurts little more Pain Location: left hip with movement Pain Descriptors / Indicators: Operative site guarding;Sore;Guarding;Grimacing Pain Intervention(s): Limited activity within patient's tolerance;Monitored during session    Home Living                      Prior Function            PT Goals (current goals can now be found in the care plan section) Acute Rehab PT Goals  Patient Stated Goal: go to rehab Potential to Achieve Goals: Good Progress towards PT goals: Progressing toward goals    Frequency    Min 3X/week      PT Plan Current plan remains appropriate    Co-evaluation              AM-PAC PT "6 Clicks" Mobility   Outcome Measure  Help needed turning from your back to your side while in a flat bed without using bedrails?: None Help needed moving from lying on your back to sitting on the side of a flat bed without using bedrails?: A  Little Help needed moving to and from a bed to a chair (including a wheelchair)?: A Little Help needed standing up from a chair using your arms (e.g., wheelchair or bedside chair)?: A Little Help needed to walk in hospital room?: A Little Help needed climbing 3-5 steps with a railing? : A Lot 6 Click Score: 18    End of Session   Activity Tolerance: Patient tolerated treatment well Patient left: in chair;with call bell/phone within reach;with chair alarm set   PT Visit Diagnosis: Pain;Muscle weakness (generalized) (M62.81);Unsteadiness on feet (R26.81);Difficulty in walking, not elsewhere classified (R26.2) Pain - Right/Left: Left Pain - part of body: Hip     Time: 2536-6440 PT Time Calculation (min) (ACUTE ONLY): 12 min  Charges:  $Gait Training: 8-22 mins                       Lillia Pauls, PT, DPT Acute Rehabilitation Services Pager [phone] Office [phone]    Norval Morton 07/15/2019, 11:51 AM

## 2019-07-15 NOTE — Discharge Summary (Addendum)
Discharge Summary  Sabrina Glass QMG:[id number] DOB: 27-Nov-1927  PCP: Patient, No Pcp Per  Admit date: 07/03/2019 Discharge date: 07/15/2019  Time spent: 35 minutes   Recommendations for Outpatient Follow-up:  1. Discharge to home with home health services 2. Follow up with ortho 2 weeks    Discharge Diagnoses:  Active Hospital Problems   Diagnosis Date Noted  . Closed left hip fracture, initial encounter (HCC) 07/03/2019  . Essential hypertension 07/04/2019  . Anemia 07/04/2019  . Pressure injury of skin 07/04/2019  . Hip fracture (HCC) 07/03/2019    Resolved Hospital Problems  No resolved problems to display.    Discharge Condition: Stable   Diet recommendation: Resume previous diet  Vitals:   07/15/19 0313 07/15/19 0836  BP: (!) 153/82 (!) 137/103  Pulse: 82 81  Resp: 18 15  Temp: 97.7 F (36.5 C) 98.7 F (37.1 C)  SpO2: 97% 99%    History of present illness:  Sabrina Glass 86 year old female with a history of hypertension brought to the emergency department at [facility] in [place] after a mechanical fall.  She did not hit her head or lose consciousness.  X-rays in the ER showed left hip fracture and the patient was transferred to [facility] for further management.  Work-up revealed anemia with a hemoglobin around 9, mild leukocytosis, chest x-ray showing chronic findings.  Evaluated by orthopedics and underwent surgical repair on June 13   07/16/19: Patient seen and examined. She wants to go home. TOC team assisting with home health services.    Hospital Course:  Principal Problem:   Closed left hip fracture, initial encounter [facility]) Active Problems:   Hip fracture (HCC)   Essential hypertension   Anemia   Pressure injury of skin  Left hip fracture status post mechanical fall: Status post total left hip arthroplasty, anterior approach by Dr. Linna Caprice on 07/05/2019. Pain is controlled. Management per orthopedics. 07/11/2019: Being  discharged to nursing facility.  Staples to be removed June 23.  Follow-up with orthopedics 2 weeks  Essential hypertension: Blood pressure very well controlled. Continue lisinopril and as needed hydralazine.  Acute blood loss anemia: Hemoglobin dropped from 9.7>7.5>7.1 07/07/2019. Received 1 unit of PRBC transfusion. Posttransfusion hemoglobin today is 9.7>8.9>9.5. She is stable without any symptoms.No overt bleeding.  Leukocytosislikely reactivepatient is afebrile without any signs or symptoms of infection.  Resolved post repletion:Mild hypokalemia:        Procedures:  Remove staples 07/15/19  Left total hip arthroplasty, anterior approach, Dr. Leroy Libman on 07/05/19.  Consultations:  Ortho  Discharge Exam: BP (!) 137/103 (BP Location: Left Arm)   Pulse 81   Temp 98.7 F (37.1 C) (Oral)   Resp 15   Ht 5\' 1"  (1.549 m)   Wt 48.5 kg   SpO2 99%   BMI 20.20 kg/m  . General: 86 y.o. year-old female well developed well nourished in no acute distress.  Alert and interactive. . Cardiovascular: Regular rate and rhythm with no rubs or gallops.  No thyromegaly or JVD noted.   95 Respiratory: Clear to auscultation with no wheezes or rales. Good inspiratory effort. . Abdomen: Soft nontender nondistended with normal bowel sounds x4 quadrants. . Musculoskeletal: No lower extremity edema. 2/4 pulses in all 4 extremities. Marland Kitchen Psychiatry: Mood is appropriate for condition and setting  Discharge Instructions You were cared for by a hospitalist during your hospital stay. If you have any questions about your discharge medications or the care you received while you were in the hospital after  you are discharged, you can call the unit and asked to speak with the hospitalist on call if the hospitalist that took care of you is not available. Once you are discharged, your primary care physician will handle any further medical issues. Please note that NO REFILLS for any discharge  medications will be authorized once you are discharged, as it is imperative that you return to your primary care physician (or establish a relationship with a primary care physician if you do not have one) for your aftercare needs so that they can reassess your need for medications and monitor your lab values.   Allergies as of 07/15/2019      Reactions   Codeine Nausea And Vomiting, Swelling, Rash   Throat swelling (pt can take percocet)   Penicillins Nausea And Vomiting, Swelling, Rash   Throat swelling   Sulfa Antibiotics Nausea And Vomiting, Swelling, Rash   Throat swelling      Medication List    STOP taking these medications   oxyCODONE-acetaminophen 10-325 MG tablet Commonly known as: PERCOCET     TAKE these medications   ALPRAZolam 0.5 MG tablet Commonly known as: XANAX Take 1 tablet (0.5 mg total) by mouth daily as needed for anxiety or sleep. What changed:   when to take this  reasons to take this   aspirin 81 MG chewable tablet Chew 1 tablet (81 mg total) by mouth 2 (two) times daily with a meal.   Butalbital-APAP-Caffeine 50-300-40 MG Caps Take 1 tablet by mouth 4 (four) times daily as needed for migraine.   diclofenac Sodium 1 % Gel Commonly known as: VOLTAREN Apply 1 application topically 2 (two) times daily.   ferrous sulfate 325 (65 FE) MG tablet Take 325 mg by mouth daily with breakfast.   fluticasone 50 MCG/ACT nasal spray Commonly known as: FLONASE Place 1 spray into both nostrils daily as needed for rhinitis.   HYDROcodone-acetaminophen 5-325 MG tablet Commonly known as: NORCO/VICODIN Take 1 tablet by mouth every 4 (four) hours as needed for moderate pain.   ibuprofen 200 MG tablet Commonly known as: ADVIL Take 200 mg by mouth daily as needed for fever or headache (pain).   lisinopril 20 MG tablet Commonly known as: ZESTRIL Take 20 mg by mouth daily.   pantoprazole 40 MG tablet Commonly known as: PROTONIX Take 40 mg by mouth at  bedtime.   polyethylene glycol 17 g packet Commonly known as: MIRALAX / GLYCOLAX Take 17 g by mouth daily as needed (constipation).   promethazine 25 MG tablet Commonly known as: PHENERGAN Take 25 mg by mouth every 6 (six) hours as needed for nausea or vomiting.   Vitamin D (Ergocalciferol) 1.25 MG (50000 UNIT) Caps capsule Commonly known as: DRISDOL Take 50,000 Units by mouth every Wednesday.      Allergies  Allergen Reactions  . Codeine Nausea And Vomiting, Swelling and Rash    Throat swelling (pt can take percocet)  . Penicillins Nausea And Vomiting, Swelling and Rash    Throat swelling  . Sulfa Antibiotics Nausea And Vomiting, Swelling and Rash    Throat swelling    Follow-up Information    Swinteck, Arlys JohnBrian, MD. Schedule an appointment as soon as possible for a visit in 2 week(s).   Specialty: Orthopedic Surgery Contact information: [address] [place] [address] [phone]                The results of significant diagnostics from this hospitalization (including imaging, microbiology, ancillary and laboratory)  are listed below for reference.    Significant Diagnostic Studies: CT LUMBAR SPINE WO CONTRAST  Result Date: 07/05/2019 CLINICAL DATA:  86 year old female with low back pain. Recent fall with hip fracture and ORIF left hip. EXAM: CT LUMBAR SPINE WITHOUT CONTRAST TECHNIQUE: Multidetector CT imaging of the lumbar spine was performed without intravenous contrast administration. Multiplanar CT image reconstructions were also generated. COMPARISON:  Hip and pelvis radiographs today. FINDINGS: Segmentation: Normal assuming hypoplastic ribs at T12, full size ribs on the left at T11 (hypoplastic right T11 rib). Alignment: Moderate scoliosis mostly levoconvex in the lumbar spine. Subsequent straightening of lumbar lordosis. Subtle anterolisthesis of L2 on L3. Vertebrae: Severe T12 compression fracture, although appears likely to be chronic. No  acute endplate fracture lucency is evident. The visible T11 level appears intact. Lumbar vertebrae appear intact. Osteopenia. No sacral fracture is identified. SI joints appear intact. Paraspinal and other soft tissues: Extensive Calcified aortic atherosclerosis. Partially visible gastric hiatal hernia. Simple fluid density left renal upper pole cyst. Mild ectasia of the abdominal aorta. Negative visible other noncontrast abdominal viscera. Lumbar paraspinal soft tissues appear negative. Disc levels: Intermittent severe lumbar disc and endplate degeneration, sparing L2-L3 and L5-S1. Vacuum disc and severe disc space loss elsewhere. Subsequent multifactorial mild to moderate spinal stenosis at L3-L4. Up to mild lumbar spinal stenosis elsewhere. IMPRESSION: 1. Osteopenia. Severe T12 compression fracture although most likely chronic. No acute osseous abnormality identified in the lumbar spine or visible sacrum. Lumbar MRI (noncontrast) or Nuclear Medicine Whole-body Bone Scan would confirm as necessary. 2. Moderate lumbar scoliosis with advanced disc and endplate degeneration. Up to moderate degenerative spinal stenosis at L3-L4. 3. Aortic Atherosclerosis (ICD10-I70.0).  Hiatal hernia. Electronically Signed   By: Odessa Fleming M.D.   On: 07/05/2019 17:13   MR LUMBAR SPINE WO CONTRAST  Result Date: 07/06/2019 CLINICAL DATA:  Low back pain, increased fracture risk. Recent hip replacement. EXAM: MRI LUMBAR SPINE WITHOUT CONTRAST TECHNIQUE: Multiplanar, multisequence MR imaging of the lumbar spine was performed. No intravenous contrast was administered. COMPARISON:  CT lumbar spine 07/05/2019 FINDINGS: Segmentation:  Normal Alignment: Moderate to severe lumbar scoliosis. Normal sagittal alignment Vertebrae: Negative for acute fracture or mass. Severe chronic compression fracture T12 vertebral body without bone marrow edema. No change from recent CT. Conus medullaris and cauda equina: Conus extends to the L1-2 level. Conus  and cauda equina appear normal. Paraspinal and other soft tissues: Negative for paraspinous mass or adenopathy. Bilateral renal cysts. Disc levels: T12-L1: Negative for stenosis.  Mild disc and facet degeneration L1-2: Moderate disc degeneration and spurring. Mild spinal stenosis and mild facet degeneration bilaterally. L2-3: Mild disc bulging. Small left sided disc protrusion without neural impingement. Moderate facet hypertrophy bilaterally. Mild spinal stenosis. L3-4: Asymmetric disc degeneration and spurring on the right. Moderate subarticular stenosis on the right with expected impingement of the right L4 nerve root. Mild spinal stenosis L4-5: Disc degeneration with disc space narrowing and endplate spurring. Moderate to severe subarticular stenosis on the left. Bilateral facet degeneration. Spinal canal adequate in size L5-S1: Mild disc degeneration. Moderate facet degeneration. Mild subarticular stenosis on the right. IMPRESSION: Negative for acute fracture. Severe chronic compression fracture T12 Moderate to severe scoliosis. Multilevel degenerative changes above. Electronically Signed   By: Marlan Palau M.D.   On: 07/06/2019 19:22   Pelvis Portable  Result Date: 07/05/2019 CLINICAL DATA:  Status post left total hip replacement EXAM: PORTABLE PELVIS 1-2 VIEWS COMPARISON:  July 04, 2019 FINDINGS: Frontal pelvis image obtained. There is a  total hip replacement on the left with prosthetic components well-seated. No acute fracture or dislocation evident on frontal view. Moderate narrowing right hip joint. Bones osteoporotic. There are multiple foci of atherosclerotic arterial vascular calcification in the proximal thigh regions. IMPRESSION: Status post total hip replacement on the left with prosthetic components well-seated. Bones osteoporotic. No fracture or dislocation evident on frontal view. Moderate narrowing right hip joint. Electronically Signed   By: Lowella Grip III M.D.   On: 07/05/2019  10:58   DG Knee Left Port  Result Date: 07/04/2019 CLINICAL DATA:  Hip fracture.  Left knee pain. EXAM: PORTABLE LEFT KNEE - 1-2 VIEW COMPARISON:  None. FINDINGS: No evidence of fracture, dislocation, or joint effusion. No evidence of arthropathy or other focal bone abnormality. Soft tissues are unremarkable. IMPRESSION: Negative. Electronically Signed   By: Ulyses Jarred M.D.   On: 07/04/2019 06:38   DG C-Arm 1-60 Min  Result Date: 07/05/2019 CLINICAL DATA:  Status post total hip arthroplasty EXAM: DG C-ARM 1-60 MIN; OPERATIVE LEFT HIP WITH PELVIS FLUOROSCOPY TIME:  Fluoroscopy Time:  0 minutes 13 seconds Radiation Exposure Index (if provided by the fluoroscopic device): 1.12 mGy Number of Acquired Spot Images: 2 COMPARISON:  July 04, 2019 FINDINGS: Frontal lower pelvis and frontal left hip images obtained. There is a total hip replacement on the left with prosthetic components well-seated on frontal view. No acute fracture or dislocation. Moderate narrowing right hip joint. IMPRESSION: Status post total hip replacement on the left with prosthetic components well-seated on frontal view. No fracture or dislocation evident. Electronically Signed   By: Lowella Grip III M.D.   On: 07/05/2019 11:35   DG HIP OPERATIVE UNILAT W OR W/O PELVIS LEFT  Result Date: 07/05/2019 CLINICAL DATA:  Status post total hip arthroplasty EXAM: DG C-ARM 1-60 MIN; OPERATIVE LEFT HIP WITH PELVIS FLUOROSCOPY TIME:  Fluoroscopy Time:  0 minutes 13 seconds Radiation Exposure Index (if provided by the fluoroscopic device): 1.12 mGy Number of Acquired Spot Images: 2 COMPARISON:  July 04, 2019 FINDINGS: Frontal lower pelvis and frontal left hip images obtained. There is a total hip replacement on the left with prosthetic components well-seated on frontal view. No acute fracture or dislocation. Moderate narrowing right hip joint. IMPRESSION: Status post total hip replacement on the left with prosthetic components well-seated on  frontal view. No fracture or dislocation evident. Electronically Signed   By: Lowella Grip III M.D.   On: 07/05/2019 11:35   DG HIP UNILAT WITH PELVIS 2-3 VIEWS LEFT  Result Date: 07/04/2019 CLINICAL DATA:  Hip fracture EXAM: DG HIP (WITH OR WITHOUT PELVIS) 2-3V LEFT COMPARISON:  None. FINDINGS: There is a mildly displaced medially angulated fracture of the left femoral neck. No dislocation. No other pelvic fracture. There is moderate left hip osteoarthrosis. IMPRESSION: Mildly displaced, medially angulated fracture of the left femoral neck. Electronically Signed   By: Ulyses Jarred M.D.   On: 07/04/2019 06:35    Microbiology: Recent Results (from the past 240 hour(s))  SARS Coronavirus 2 by RT PCR (hospital order, performed in [facility] hospital lab) Nasopharyngeal Nasopharyngeal Swab     Status: None   Collection Time: 07/07/19 12:29 PM   Specimen: Nasopharyngeal Swab  Result Value Ref Range Status   SARS Coronavirus 2 NEGATIVE NEGATIVE Final    Comment: (NOTE) SARS-CoV-2 target nucleic acids are NOT DETECTED.  The SARS-CoV-2 RNA is generally detectable in upper and lower respiratory specimens during the acute phase of infection. The lowest concentration of SARS-CoV-2  viral copies this assay can detect is 250 copies / mL. A negative result does not preclude SARS-CoV-2 infection and should not be used as the sole basis for treatment or other patient management decisions.  A negative result may occur with improper specimen collection / handling, submission of specimen other than nasopharyngeal swab, presence of viral mutation(s) within the areas targeted by this assay, and inadequate number of viral copies (<250 copies / mL). A negative result must be combined with clinical observations, patient history, and epidemiological information.  Fact Sheet for Patients:   BoilerBrush.com.cy  Fact Sheet for Healthcare  Providers: https://pope.com/  This test is not yet approved or  cleared by the [place] FDA and has been authorized for detection and/or diagnosis of SARS-CoV-2 by FDA under an Emergency Use Authorization (EUA).  This EUA will remain in effect (meaning this test can be used) for the duration of the COVID-19 declaration under Section 564(b)(1) of the Act, 21 U.S.C. section 360bbb-3(b)(1), unless the authorization is terminated or revoked sooner.  Performed at [facility], [address], [place], [place] [address]   SARS CORONAVIRUS 2 (TAT 6-24 HRS) Nasopharyngeal Nasopharyngeal Swab     Status: None   Collection Time: 07/13/19 12:11 PM   Specimen: Nasopharyngeal Swab  Result Value Ref Range Status   SARS Coronavirus 2 NEGATIVE NEGATIVE Final    Comment: (NOTE) SARS-CoV-2 target nucleic acids are NOT DETECTED.  The SARS-CoV-2 RNA is generally detectable in upper and lower respiratory specimens during the acute phase of infection. Negative results do not preclude SARS-CoV-2 infection, do not rule out co-infections with other pathogens, and should not be used as the sole basis for treatment or other patient management decisions. Negative results must be combined with clinical observations, patient history, and epidemiological information. The expected result is Negative.  Fact Sheet for Patients: HairSlick.no  Fact Sheet for Healthcare Providers: quierodirigir.com  This test is not yet approved or cleared by the [place] FDA and  has been authorized for detection and/or diagnosis of SARS-CoV-2 by FDA under an Emergency Use Authorization (EUA). This EUA will remain  in effect (meaning this test can be used) for the duration of the COVID-19 declaration under Se ction 564(b)(1) of the Act, 21 U.S.C. section 360bbb-3(b)(1), unless the authorization is terminated or revoked  sooner.  Performed at [facility], [address], [place], [place] [address]      Labs: Basic Metabolic Panel: No results for input(s): NA, K, CL, CO2, GLUCOSE, BUN, CREATININE, CALCIUM, MG, PHOS in the last 168 hours. Liver Function Tests: No results for input(s): AST, ALT, ALKPHOS, BILITOT, PROT, ALBUMIN in the last 168 hours. No results for input(s): LIPASE, AMYLASE in the last 168 hours. No results for input(s): AMMONIA in the last 168 hours. CBC: Recent Labs  Lab 07/09/19 1035 07/10/19 0826  WBC 10.8* 11.5*  NEUTROABS 7.6 7.9*  HGB 8.9* 9.5*  HCT 28.7* 30.7*  MCV 86.7 87.7  PLT 409* 483*   Cardiac Enzymes: No results for input(s): CKTOTAL, CKMB, CKMBINDEX, TROPONINI in the last 168 hours. BNP: BNP (last 3 results) No results for input(s): BNP in the last 8760 hours.  ProBNP (last 3 results) No results for input(s): PROBNP in the last 8760 hours.  CBG: No results for input(s): GLUCAP in the last 168 hours.     Signed:  Darlin Drop, MD [facility] 07/15/2019, 10:02 AM

## 2019-07-15 NOTE — TOC Progression Note (Addendum)
Transition of Care [facility]) - Progression Note    Patient Details  Name: Sabrina Glass MRN: [id number] Date of Birth: 1927/06/18  [facility] CM/SW Contact  Epifanio Lesches, RN Phone Number: [phone] 07/15/2019, 9:07 AM  Clinical Narrative:    Still awaiting insurance authorization for SNF placement. Last COVID noted 6/21. TOC team will continue to monitor and follow ...   07/15/2019 @ 11:10 am NCM received call from [facility] admission. Insurance authorization still pending .... case has been escalated to quality control. Admission liaison to f/u with NCM once determination received.  Expected Discharge Plan: Skilled Nursing Facility ([facility]) Barriers to Discharge: Insurance Authorization  Expected Discharge Plan and Services Expected Discharge Plan: Skilled Nursing Facility [facility])         Expected Discharge Date: 07/14/19                                     Social Determinants of Health (SDOH) Interventions    Readmission Risk Interventions No flowsheet data found.

## 2019-07-15 NOTE — Plan of Care (Signed)

## 2019-07-16 LAB — SARS CORONAVIRUS 2 (TAT 6-24 HRS): SARS Coronavirus 2: NEGATIVE

## 2019-07-16 NOTE — Progress Notes (Signed)
    Durable Medical Equipment  (From admission, onward)         Start     Ordered   07/16/19 1648  For home use only DME Walker rolling  Once       Question Answer Comment  Walker: With 5 Inch Wheels   Patient needs a walker to treat with the following condition Ambulatory dysfunction      07/16/19 1648   07/16/19 1648  For home use only DME Hospital bed  Once       Question Answer Comment  Length of Need Lifetime   The above medical condition requires: Patient requires the ability to reposition frequently   Head must be elevated greater than: 30 degrees   Bed type Semi-electric      07/16/19 1648   07/16/19 1646  For home use only DME standard manual wheelchair with seat cushion  Once       Comments: Patient suffers from ambulatory dysfunction which impairs their ability to perform daily activities like walking in the home.  A walking aid will not resolve issue with performing activities of daily living. A wheelchair will allow patient to safely perform daily activities. Patient can safely propel the wheelchair in the home or has a caregiver who can provide assistance. Length of need 6 months. Accessories: elevating leg rests, wheel locks, extensions and anti-tippers.   07/16/19 1647

## 2019-07-16 NOTE — TOC Progression Note (Addendum)
Transition of Care [facility]) - Progression Note    Patient Details  Name: Sabrina Glass MRN: [id number] Date of Birth: 01-17-1928  [facility]) CM/SW Contact  Epifanio Lesches, RN Phone Number: 07/16/2019, 5:19 PM  Clinical Narrative:     After waiting for insurance authorization for SNF pt has now decided she would like to d/c to home with family. NCM called pt's daughter Velna Hatchet @ [phone] and made her aware. Pt states doesn't want to go to [employer]. Daughter states as long as HH is in place she's ok with mom coming home. Daughter states pt will need hospital bed, w/c and rolling walker with d/c.  MD made aware by NCM. Orders noted for [facility] services and DME needs....  Referral made with [facility] ... acceptance pending.Marland Kitchen Referral made with [facility] for DME needs ( hospital bed, rolling walker, W/C)...  Daughter states she can receive mom early afternoon on tom after she's back from court.   Iris Pert CM [phone]/ [employer] did f/u will NCM regarding SNF authorization. NCM made her aware pt declining SNF placement now. Morrie Sheldon made NCM aware if changes are made and pt  needs SNF placement to let her know and she will reopen case.  TOC team will continue to monitor  Expected Discharge Plan: Home w Home Health Services Barriers to Discharge: Insurance Authorization  Expected Discharge Plan and Services Expected Discharge Plan: Home w Home Health Services         Expected Discharge Date: 07/15/19                                     Social Determinants of Health (SDOH) Interventions    Readmission Risk Interventions No flowsheet data found.

## 2019-07-16 NOTE — Progress Notes (Addendum)
NCM received a call from [facility] SPECIALIST), ref.# [id number] given. Iris Pert CM [phone]. States [employer] CM will give hospital CM a call back after reviewing the case with determination. Gae Gallop RN, BSN, CM

## 2019-07-16 NOTE — Plan of Care (Signed)

## 2019-07-16 NOTE — Plan of Care (Addendum)
COVID test obtain and taken to the lab @0350    Problem: Education: Goal: Knowledge of General Education information will improve Description: Including pain rating scale, medication(s)/side effects and non-pharmacologic comfort measures Outcome: Progressing   Problem: Activity: Goal: Risk for activity intolerance will decrease Outcome: Progressing   Problem: Pain Managment: Goal: General experience of comfort will improve Outcome: Progressing   Problem: Safety: Goal: Ability to remain free from injury will improve Outcome: Progressing   Problem: Skin Integrity: Goal: Risk for impaired skin integrity will decrease Outcome: Progressing

## 2019-07-17 NOTE — Progress Notes (Signed)
Physical Therapy Treatment Patient Details Name: Sabrina Glass MRN: [id number] DOB: 04-Jan-1928 Today's Date: 07/17/2019    History of Present Illness Patient is a 86 y/o female who presents with left hip fx s/p fall now s/p left THA 07/05/19. PMH includes HTN.    PT Comments    Pt supine in bed on arrival.  Pt eager to mobilize.  Issued HEP for home use.  Plan to d/c home today.     Follow Up Recommendations  SNF;Supervision for mobility/OOB     Equipment Recommendations  None recommended by PT    Recommendations for Other Services       Precautions / Restrictions Precautions Precautions: Fall Restrictions Weight Bearing Restrictions: Yes LLE Weight Bearing: Weight bearing as tolerated    Mobility  Bed Mobility Overal bed mobility: Needs Assistance Bed Mobility: Supine to Sit     Supine to sit: Min guard        Transfers Overall transfer level: Needs assistance Equipment used: Rolling walker (2 wheeled) Transfers: Sit to/from Stand Sit to Stand: Min assist         General transfer comment: MinA to rise to standing from edge of bed  Ambulation/Gait Ambulation/Gait assistance: Min assist Gait Distance (Feet): 40 Feet Assistive device: Rolling walker (2 wheeled) Gait Pattern/deviations: Antalgic;Trunk flexed;Decreased stride length;Step-to pattern;Decreased step length - right;Decreased weight shift to left Gait velocity: decreased   General Gait Details: Cues for shorter L step length and controlling pace on turns, pt utilizing step to pattern. MinA for stability. Kyphotic posturing.   Stairs             Wheelchair Mobility    Modified Rankin (Stroke Patients Only)       Balance Overall balance assessment: Needs assistance Sitting-balance support: Feet supported;No upper extremity supported Sitting balance-Leahy Scale: Fair     Standing balance support: During functional activity;Bilateral upper extremity supported Standing  balance-Leahy Scale: Poor                              Cognition Arousal/Alertness: Awake/alert Behavior During Therapy: WFL for tasks assessed/performed Overall Cognitive Status: No family/caregiver present to determine baseline cognitive functioning Area of Impairment: Memory                     Memory: Decreased short-term memory         General Comments: Pt tangential with speech. Follows commands. needs to be redirected frequently.      Exercises General Exercises - Lower Extremity Ankle Circles/Pumps: AROM;Both;10 reps;Supine Quad Sets: AROM;Both;10 reps;Supine Heel Slides: Both;10 reps;Supine Hip ABduction/ADduction: AROM;AAROM;Both;10 reps;Supine    General Comments        Pertinent Vitals/Pain Pain Assessment: Faces Faces Pain Scale: Hurts little more Pain Location: left hip with movement Pain Descriptors / Indicators: Operative site guarding;Sore;Guarding;Grimacing Pain Intervention(s): Monitored during session;Repositioned    Home Living                      Prior Function            PT Goals (current goals can now be found in the care plan section) Acute Rehab PT Goals Patient Stated Goal: go to rehab Potential to Achieve Goals: Good Progress towards PT goals: Progressing toward goals    Frequency    Min 3X/week      PT Plan Current plan remains appropriate    Co-evaluation  AM-PAC PT "6 Clicks" Mobility   Outcome Measure  Help needed turning from your back to your side while in a flat bed without using bedrails?: None Help needed moving from lying on your back to sitting on the side of a flat bed without using bedrails?: A Little Help needed moving to and from a bed to a chair (including a wheelchair)?: A Little Help needed standing up from a chair using your arms (e.g., wheelchair or bedside chair)?: A Little Help needed to walk in hospital room?: A Little Help needed climbing 3-5 steps  with a railing? : A Little 6 Click Score: 19    End of Session Equipment Utilized During Treatment: Gait belt Activity Tolerance: Patient tolerated treatment well Patient left: in chair;with call bell/phone within reach;with chair alarm set Nurse Communication: Mobility status PT Visit Diagnosis: Pain;Muscle weakness (generalized) (M62.81);Unsteadiness on feet (R26.81);Difficulty in walking, not elsewhere classified (R26.2) Pain - Right/Left: Left Pain - part of body: Hip     Time: 1035-1101 PT Time Calculation (min) (ACUTE ONLY): 26 min  Charges:  $Gait Training: 8-22 mins $Therapeutic Exercise: 8-22 mins                     Bonney Leitz , PTA Acute Rehabilitation Services Pager [phone] Office [phone]     Coleby Yett Artis Delay 07/17/2019, 6:31 PM

## 2019-07-17 NOTE — Progress Notes (Signed)
Discharge packet printed and provided to the patient along with prescriptions.  The patient plans to go home with her daughter.

## 2019-07-17 NOTE — TOC Transition Note (Signed)
Transition of Care [facility]) - CM/SW Discharge Note   Patient Details  Name: Sabrina Glass MRN: [id number] Date of Birth: [birth year]  Transition of Care [facility]) CM/SW Contact:  Epifanio Lesches, RN Phone Number: 07/17/2019, 12:15 PM   Clinical Narrative:    Pt will transition home today with family. Daughter Sabrina Glass will provide transportation to home.Rolling walker will be delivered to bedside prior to d/c. Hospital bed and wheel chair will be delivered to pt's home by [facility] pending approval. [facility] accept pt for home health services, [facility] 6/28. NCM made daughter aware.  Final next level of care: Home w Home Health Services Barriers to Discharge: No Barriers Identified   Patient Goals and CMS Choice     Choice offered to / list presented to : Patient, Adult Children Sabrina Glass)  Discharge Placement                       Discharge Plan and Services                DME Arranged: Dan Humphre[facility] youth, Wheelchair manual, 3-N-1, Hospital bed (Referral made with [facility] DME for hospital bed and wheelchair) DME Agency: Other - Comment, [facility] ([facility] ( hospital bed, W/C)) Date DME Agency Contacted: 07/17/19 Time DME Agency Contacted: [phone] Representative spoke with at DME Agency: Ian Malkin ( [facility]) , Selena Batten( [facility]) HH Arranged: PT, OT HH Agency: Other - See comment ([facility]) Date [facility] Agency Contacted: 07/17/19 Time HH Agency Contacted: [phone] Representative spoke with at [facility] Agency: Melissa  Social Determinants of Health (SDOH) Interventions     Readmission Risk Interventions No flowsheet data found.

## 2019-07-17 NOTE — Discharge Summary (Addendum)
Discharge Summary  Sabrina Glass ZOX:[id number] DOB: [birth year]  PCP: Patient, No Pcp Per  Admit date: 07/03/2019 Discharge date: 07/17/2019  Time spent: 35 minutes   Recommendations for Outpatient Follow-up:  1. Discharge to home with home health services 2. Follow up with orthopedic surgery 3. Follow-up with your PCP 4. Take your medications as prescribed 5. Continue PT OT with assistance and fall precautions    Discharge Diagnoses:  Active Hospital Problems   Diagnosis Date Noted  . Closed left hip fracture, initial encounter (HCC) 07/03/2019  . Essential hypertension 07/04/2019  . Anemia 07/04/2019  . Pressure injury of skin 07/04/2019  . Hip fracture (HCC) 07/03/2019    Resolved Hospital Problems  No resolved problems to display.    Discharge Condition: Stable   Diet recommendation: Resume previous diet  Vitals:   07/17/19 0725 07/17/19 0903  BP: (!) 153/97 (!) 153/97  Pulse: 90   Resp: 17   Temp: 98.4 F (36.9 C)   SpO2: 97%     History of present illness:  Sabrina Glass 86 year old female with a history of hypertension brought to the emergency department at [facility] in [place] after a mechanical fall.  She did not hit her head or lose consciousness.  X-rays in the ER showed left hip fracture and the patient was transferred to [facility] for further management.  Work-up revealed anemia with a hemoglobin around 9, mild leukocytosis, chest x-ray showing chronic findings.  Evaluated by orthopedics and underwent surgical repair on June 13   07/17/19: Patient seen and examined.  She has no new complaints.  She is eager to go home.  Will receive home health services and DMEs.  States she lives with family members who will be able to provide 24-hour supervision.    Hospital Course:  Principal Problem:   Closed left hip fracture, initial encounter [facility]) Active Problems:   Hip fracture (HCC)   Essential hypertension   Anemia   Pressure  injury of skin  Left hip fracture status post mechanical fall post total left hip arthroplasty:  Status post total left hip arthroplasty, anterior approach by Dr. Linna Caprice on 07/05/2019. Pain is controlled. Management per orthopedics. Follow-up with orthopedics  Essential hypertension:  Blood pressure is stable. Continue lisinopril  Acute blood loss anemia post surgery:  Hemoglobin stable 9.5  Received 1 unit of PRBC transfusion.  No overt bleeding at this time.  Leukocytosislikely reactive: Afebrile with no evidence of active infective process  Resolved hypokalemia, post repletion: Repleted  Ambulatory dysfunction post left hip repair Declines SNF Home health PT OT with assistance and fall precautions      Procedures:  Left total hip arthroplasty, anterior approach, Dr. Leroy Libman on 07/05/19.  Consultations:  Ortho  Discharge Exam: BP (!) 153/97   Pulse 90   Temp 98.4 F (36.9 C) (Oral)   Resp 17   Ht  (1.549 m)   Wt 48.5 kg   SpO2 97%   BMI 20.20 kg/m  . General: 86 y.o. year-old female well developed well nourished in no acute distress.  Alert and interactive. . Cardiovascular: Regular rate and rhythm with no rubs or gallops.  No thyromegaly or JVD noted.   Marland Kitchen Respiratory: Clear to auscultation with no wheezes or rales. Good inspiratory effort. . Abdomen: Soft nontender nondistended with normal bowel sounds x4 quadrants. . Musculoskeletal: No lower extremity edema. 2/4 pulses in all 4 extremities. Marland Kitchen Psychiatry: Mood is appropriate for condition and setting  Discharge Instructions You were  cared for by a hospitalist during your hospital stay. If you have any questions about your discharge medications or the care you received while you were in the hospital after you are discharged, you can call the unit and asked to speak with the hospitalist on call if the hospitalist that took care of you is not available. Once you are discharged, your primary care  physician will handle any further medical issues. Please note that NO REFILLS for any discharge medications will be authorized once you are discharged, as it is imperative that you return to your primary care physician (or establish a relationship with a primary care physician if you do not have one) for your aftercare needs so that they can reassess your need for medications and monitor your lab values.   Allergies as of 07/17/2019      Reactions   Codeine Nausea And Vomiting, Swelling, Rash   Throat swelling (pt can take percocet)   Penicillins Nausea And Vomiting, Swelling, Rash   Throat swelling   Sulfa Antibiotics Nausea And Vomiting, Swelling, Rash   Throat swelling      Medication List    STOP taking these medications   oxyCODONE-acetaminophen 10-325 MG tablet Commonly known as: PERCOCET     TAKE these medications   ALPRAZolam 0.5 MG tablet Commonly known as: XANAX Take 1 tablet (0.5 mg total) by mouth daily as needed for anxiety or sleep. What changed:   when to take this  reasons to take this   aspirin 81 MG chewable tablet Chew 1 tablet (81 mg total) by mouth 2 (two) times daily with a meal.   Butalbital-APAP-Caffeine 50-300-40 MG Caps Take 1 tablet by mouth 4 (four) times daily as needed for migraine.   diclofenac Sodium 1 % Gel Commonly known as: VOLTAREN Apply 1 application topically 2 (two) times daily.   ferrous sulfate 325 (65 FE) MG tablet Take 325 mg by mouth daily with breakfast.   fluticasone 50 MCG/ACT nasal spray Commonly known as: FLONASE Place 1 spray into both nostrils daily as needed for rhinitis.   HYDROcodone-acetaminophen 5-325 MG tablet Commonly known as: NORCO/VICODIN Take 1 tablet by mouth every 4 (four) hours as needed for moderate pain.   ibuprofen 200 MG tablet Commonly known as: ADVIL Take 200 mg by mouth daily as needed for fever or headache (pain).   lisinopril 20 MG tablet Commonly known as: ZESTRIL Take 20 mg by mouth  daily.   pantoprazole 40 MG tablet Commonly known as: PROTONIX Take 40 mg by mouth at bedtime.   polyethylene glycol 17 g packet Commonly known as: MIRALAX / GLYCOLAX Take 17 g by mouth daily as needed (constipation).   promethazine 25 MG tablet Commonly known as: PHENERGAN Take 25 mg by mouth every 6 (six) hours as needed for nausea or vomiting.   Vitamin D (Ergocalciferol) 1.25 MG (50000 UNIT) Caps capsule Commonly known as: DRISDOL Take 50,000 Units by mouth every Wednesday.            Durable Medical Equipment  (From admission, onward)         Start     Ordered   07/16/19 1648  For home use only DME Walker rolling  Once       Question Answer Comment  Walker: With 5 Inch Wheels   Patient needs a walker to treat with the following condition Ambulatory dysfunction      07/16/19 1648   07/16/19 1648  For home use only DME Hospital bed  Once       Question Answer Comment  Length of Need Lifetime   The above medical condition requires: Patient requires the ability to reposition frequently   Head must be elevated greater than: 30 degrees   Bed type Semi-electric      07/16/19 1648   07/16/19 1646  For home use only DME standard manual wheelchair with seat cushion  Once       Comments: Patient suffers from ambulatory dysfunction which impairs their ability to perform daily activities like walking in the home.  A walking aid will not resolve issue with performing activities of daily living. A wheelchair will allow patient to safely perform daily activities. Patient can safely propel the wheelchair in the home or has a caregiver who can provide assistance. Length of need 6 months. Accessories: elevating leg rests, wheel locks, extensions and anti-tippers.   07/16/19 1647         Allergies  Allergen Reactions  . Codeine Nausea And Vomiting, Swelling and Rash    Throat swelling (pt can take percocet)  . Penicillins Nausea And Vomiting, Swelling and Rash    Throat  swelling  . Sulfa Antibiotics Nausea And Vomiting, Swelling and Rash    Throat swelling    Follow-up Information    Swinteck, Arlys JohnBrian, MD. Schedule an appointment as soon as possible for a visit in 2 week(s).   Specialty: Orthopedic Surgery Contact information: [address] [place] [id number] [phone]        [facility] Follow up.   Why: Home health services arranged Contact information: COVID-19 info: [email or web address]  Address: [address], [place], [place] [id number] Phone: [phone]       [facility]. Call in 1 day(s).   Why: Please call for a post hospital follow-up appointment. Contact information: [address] [place] [phone]               The results of significant diagnostics from this hospitalization (including imaging, microbiology, ancillary and laboratory) are listed below for reference.    Significant Diagnostic Studies: CT LUMBAR SPINE WO CONTRAST  Result Date: 07/05/2019 CLINICAL DATA:  86 year old female with low back pain. Recent fall with hip fracture and ORIF left hip. EXAM: CT LUMBAR SPINE WITHOUT CONTRAST TECHNIQUE: Multidetector CT imaging of the lumbar spine was performed without intravenous contrast administration. Multiplanar CT image reconstructions were also generated. COMPARISON:  Hip and pelvis radiographs today. FINDINGS: Segmentation: Normal assuming hypoplastic ribs at T12, full size ribs on the left at T11 (hypoplastic right T11 rib). Alignment: Moderate scoliosis mostly levoconvex in the lumbar spine. Subsequent straightening of lumbar lordosis. Subtle anterolisthesis of L2 on L3. Vertebrae: Severe T12 compression fracture, although appears likely to be chronic. No acute endplate fracture lucency is evident. The visible T11 level appears intact. Lumbar vertebrae appear intact. Osteopenia. No sacral fracture is identified. SI joints  appear intact. Paraspinal and other soft tissues: Extensive Calcified aortic atherosclerosis. Partially visible gastric hiatal hernia. Simple fluid density left renal upper pole cyst. Mild ectasia of the abdominal aorta. Negative visible other noncontrast abdominal viscera. Lumbar paraspinal soft tissues appear negative. Disc levels: Intermittent severe lumbar disc and endplate degeneration, sparing L2-L3 and L5-S1. Vacuum disc and severe disc space loss elsewhere. Subsequent multifactorial mild to moderate spinal stenosis at L3-L4. Up to mild lumbar spinal stenosis elsewhere. IMPRESSION: 1. Osteopenia. Severe T12 compression fracture although most likely chronic. No acute osseous abnormality identified in the  lumbar spine or visible sacrum. Lumbar MRI (noncontrast) or Nuclear Medicine Whole-body Bone Scan would confirm as necessary. 2. Moderate lumbar scoliosis with advanced disc and endplate degeneration. Up to moderate degenerative spinal stenosis at L3-L4. 3. Aortic Atherosclerosis (ICD10-I70.0).  Hiatal hernia. Electronically Signed   By: Odessa Fleming M.D.   On: 07/05/2019 17:13   MR LUMBAR SPINE WO CONTRAST  Result Date: 07/06/2019 CLINICAL DATA:  Low back pain, increased fracture risk. Recent hip replacement. EXAM: MRI LUMBAR SPINE WITHOUT CONTRAST TECHNIQUE: Multiplanar, multisequence MR imaging of the lumbar spine was performed. No intravenous contrast was administered. COMPARISON:  CT lumbar spine 07/05/2019 FINDINGS: Segmentation:  Normal Alignment: Moderate to severe lumbar scoliosis. Normal sagittal alignment Vertebrae: Negative for acute fracture or mass. Severe chronic compression fracture T12 vertebral body without bone marrow edema. No change from recent CT. Conus medullaris and cauda equina: Conus extends to the L1-2 level. Conus and cauda equina appear normal. Paraspinal and other soft tissues: Negative for paraspinous mass or adenopathy. Bilateral renal cysts. Disc levels: T12-L1: Negative for  stenosis.  Mild disc and facet degeneration L1-2: Moderate disc degeneration and spurring. Mild spinal stenosis and mild facet degeneration bilaterally. L2-3: Mild disc bulging. Small left sided disc protrusion without neural impingement. Moderate facet hypertrophy bilaterally. Mild spinal stenosis. L3-4: Asymmetric disc degeneration and spurring on the right. Moderate subarticular stenosis on the right with expected impingement of the right L4 nerve root. Mild spinal stenosis L4-5: Disc degeneration with disc space narrowing and endplate spurring. Moderate to severe subarticular stenosis on the left. Bilateral facet degeneration. Spinal canal adequate in size L5-S1: Mild disc degeneration. Moderate facet degeneration. Mild subarticular stenosis on the right. IMPRESSION: Negative for acute fracture. Severe chronic compression fracture T12 Moderate to severe scoliosis. Multilevel degenerative changes above. Electronically Signed   By: Marlan Palau M.D.   On: 07/06/2019 19:22   Pelvis Portable  Result Date: 07/05/2019 CLINICAL DATA:  Status post left total hip replacement EXAM: PORTABLE PELVIS 1-2 VIEWS COMPARISON:  July 04, 2019 FINDINGS: Frontal pelvis image obtained. There is a total hip replacement on the left with prosthetic components well-seated. No acute fracture or dislocation evident on frontal view. Moderate narrowing right hip joint. Bones osteoporotic. There are multiple foci of atherosclerotic arterial vascular calcification in the proximal thigh regions. IMPRESSION: Status post total hip replacement on the left with prosthetic components well-seated. Bones osteoporotic. No fracture or dislocation evident on frontal view. Moderate narrowing right hip joint. Electronically Signed   By: Bretta Bang III M.D.   On: 07/05/2019 10:58   DG Knee Left Port  Result Date: 07/04/2019 CLINICAL DATA:  Hip fracture.  Left knee pain. EXAM: PORTABLE LEFT KNEE - 1-2 VIEW COMPARISON:  None. FINDINGS: No  evidence of fracture, dislocation, or joint effusion. No evidence of arthropathy or other focal bone abnormality. Soft tissues are unremarkable. IMPRESSION: Negative. Electronically Signed   By: Deatra Robinson M.D.   On: 07/04/2019 06:38   DG C-Arm 1-60 Min  Result Date: 07/05/2019 CLINICAL DATA:  Status post total hip arthroplasty EXAM: DG C-ARM 1-60 MIN; OPERATIVE LEFT HIP WITH PELVIS FLUOROSCOPY TIME:  Fluoroscopy Time:  0 minutes 13 seconds Radiation Exposure Index (if provided by the fluoroscopic device): 1.12 mGy Number of Acquired Spot Images: 2 COMPARISON:  July 04, 2019 FINDINGS: Frontal lower pelvis and frontal left hip images obtained. There is a total hip replacement on the left with prosthetic components well-seated on frontal view. No acute fracture or dislocation. Moderate narrowing right hip  joint. IMPRESSION: Status post total hip replacement on the left with prosthetic components well-seated on frontal view. No fracture or dislocation evident. Electronically Signed   By: Bretta Bang III M.D.   On: 07/05/2019 11:35   DG HIP OPERATIVE UNILAT W OR W/O PELVIS LEFT  Result Date: 07/05/2019 CLINICAL DATA:  Status post total hip arthroplasty EXAM: DG C-ARM 1-60 MIN; OPERATIVE LEFT HIP WITH PELVIS FLUOROSCOPY TIME:  Fluoroscopy Time:  0 minutes 13 seconds Radiation Exposure Index (if provided by the fluoroscopic device): 1.12 mGy Number of Acquired Spot Images: 2 COMPARISON:  July 04, 2019 FINDINGS: Frontal lower pelvis and frontal left hip images obtained. There is a total hip replacement on the left with prosthetic components well-seated on frontal view. No acute fracture or dislocation. Moderate narrowing right hip joint. IMPRESSION: Status post total hip replacement on the left with prosthetic components well-seated on frontal view. No fracture or dislocation evident. Electronically Signed   By: Bretta Bang III M.D.   On: 07/05/2019 11:35   DG HIP UNILAT WITH PELVIS 2-3 VIEWS  LEFT  Result Date: 07/04/2019 CLINICAL DATA:  Hip fracture EXAM: DG HIP (WITH OR WITHOUT PELVIS) 2-3V LEFT COMPARISON:  None. FINDINGS: There is a mildly displaced medially angulated fracture of the left femoral neck. No dislocation. No other pelvic fracture. There is moderate left hip osteoarthrosis. IMPRESSION: Mildly displaced, medially angulated fracture of the left femoral neck. Electronically Signed   By: Deatra Robinson M.D.   On: 07/04/2019 06:35    Microbiology: Recent Results (from the past 240 hour(s))  SARS Coronavirus 2 by RT PCR (hospital order, performed in [facility] hospital lab) Nasopharyngeal Nasopharyngeal Swab     Status: None   Collection Time: 07/07/19 12:29 PM   Specimen: Nasopharyngeal Swab  Result Value Ref Range Status   SARS Coronavirus 2 NEGATIVE NEGATIVE Final    Comment: (NOTE) SARS-CoV-2 target nucleic acids are NOT DETECTED.  The SARS-CoV-2 RNA is generally detectable in upper and lower respiratory specimens during the acute phase of infection. The lowest concentration of SARS-CoV-2 viral copies this assay can detect is 250 copies / mL. A negative result does not preclude SARS-CoV-2 infection and should not be used as the sole basis for treatment or other patient management decisions.  A negative result may occur with improper specimen collection / handling, submission of specimen other than nasopharyngeal swab, presence of viral mutation(s) within the areas targeted by this assay, and inadequate number of viral copies (<250 copies / mL). A negative result must be combined with clinical observations, patient history, and epidemiological information.  Fact Sheet for Patients:   BoilerBrush.com.cy  Fact Sheet for Healthcare Providers: https://pope.com/  This test is not yet approved or  cleared by the [place] FDA and has been authorized for detection and/or diagnosis of SARS-CoV-2 by FDA under an  Emergency Use Authorization (EUA).  This EUA will remain in effect (meaning this test can be used) for the duration of the COVID-19 declaration under Section 564(b)(1) of the Act, 21 U.S.C. section 360bbb-3(b)(1), unless the authorization is terminated or revoked sooner.  Performed at [facility], [address], [place], [place] [address]   SARS CORONAVIRUS 2 (TAT 6-24 HRS) Nasopharyngeal Nasopharyngeal Swab     Status: None   Collection Time: 07/13/19 12:11 PM   Specimen: Nasopharyngeal Swab  Result Value Ref Range Status   SARS Coronavirus 2 NEGATIVE NEGATIVE Final    Comment: (NOTE) SARS-CoV-2 target nucleic acids are NOT DETECTED.  The SARS-CoV-2 RNA is generally detectable in upper and lower respiratory specimens during the acute phase of infection. Negative results do not preclude SARS-CoV-2 infection, do not rule out co-infections with other pathogens, and should not be used as the sole basis for treatment or other patient management decisions. Negative results must be combined with clinical observations, patient history, and epidemiological information. The expected result is Negative.  Fact Sheet for Patients: HairSlick.no  Fact Sheet for Healthcare Providers: quierodirigir.com  This test is not yet approved or cleared by the [place] FDA and  has been authorized for detection and/or diagnosis of SARS-CoV-2 by FDA under an Emergency Use Authorization (EUA). This EUA will remain  in effect (meaning this test can be used) for the duration of the COVID-19 declaration under Se ction 564(b)(1) of the Act, 21 U.S.C. section 360bbb-3(b)(1), unless the authorization is terminated or revoked sooner.  Performed at [facility], [address] [place], [place] [address]   SARS CORONAVIRUS 2 (TAT 6-24 HRS) Nasopharyngeal Nasopharyngeal Swab     Status: None   Collection Time: 07/16/19  2:23 AM    Specimen: Nasopharyngeal Swab  Result Value Ref Range Status   SARS Coronavirus 2 NEGATIVE NEGATIVE Final    Comment: (NOTE) SARS-CoV-2 target nucleic acids are NOT DETECTED.  The SARS-CoV-2 RNA is generally detectable in upper and lower respiratory specimens during the acute phase of infection. Negative results do not preclude SARS-CoV-2 infection, do not rule out co-infections with other pathogens, and should not be used as the sole basis for treatment or other patient management decisions. Negative results must be combined with clinical observations, patient history, and epidemiological information. The expected result is Negative.  Fact Sheet for Patients: HairSlick.no  Fact Sheet for Healthcare Providers: quierodirigir.com  This test is not yet approved or cleared by the [place] FDA and  has been authorized for detection and/or diagnosis of SARS-CoV-2 by FDA under an Emergency Use Authorization (EUA). This EUA will remain  in effect (meaning this test can be used) for the duration of the COVID-19 declaration under Se ction 564(b)(1) of the Act, 21 U.S.C. section 360bbb-3(b)(1), unless the authorization is terminated or revoked sooner.  Performed at [facility], [address], [place], [place] [address]      Labs: Basic Metabolic Panel: No results for input(s): NA, K, CL, CO2, GLUCOSE, BUN, CREATININE, CALCIUM, MG, PHOS in the last 168 hours. Liver Function Tests: No results for input(s): AST, ALT, ALKPHOS, BILITOT, PROT, ALBUMIN in the last 168 hours. No results for input(s): LIPASE, AMYLASE in the last 168 hours. No results for input(s): AMMONIA in the last 168 hours. CBC: No results for input(s): WBC, NEUTROABS, HGB, HCT, MCV, PLT in the last 168 hours. Cardiac Enzymes: No results for input(s): CKTOTAL, CKMB, CKMBINDEX, TROPONINI in the last 168 hours. BNP: BNP (last 3 results) No results for  input(s): BNP in the last 8760 hours.  ProBNP (last 3 results) No results for input(s): PROBNP in the last 8760 hours.  CBG: No results for input(s): GLUCAP in the last 168 hours.     Signed:  Darlin Drop, MD [facility] 07/17/2019, 10:21 AM

## 2019-07-17 NOTE — Plan of Care (Signed)

## 2021-05-28 NOTE — Progress Notes (Signed)
Patient: Sabrina Glass    Provider: Oswaldo DoneFrancisco A Shaheen Star, MD  ?DOB: 01/19/1928    Referring Provider: Simona HuhMcAlexander, Lyndsey P,*  ?MRN: [id number]    PCP: Patient, No Pcp Per (Inactive)  ?Type: New Patient      ?      ?No-show to initial evaluation on 05/29/2021.  Precharting review of medical records by Dr. Laban EmperorNaveira. ? ?Historic Controlled Substance Pharmacotherapy Review  ?PMP and historical list of controlled substances: Oxycodone/APAP 5/325, 1 tab p.o. twice daily; oxycodone/APAP 7.5/325 1 tab p.o. 4 times daily; oxycodone/APAP 10/325 1 tab p.o. 4 times daily; alprazolam 0.5 mg tablet 1 daily ?Current opioid analgesics: Oxycodone/APAP 5/325 1 tab p.o. twice daily (last filled on 05/18/2021) (prescribed by Edd FabianLyndsey Mcalexander, FNP) [facility]([place] ?MME/day: 15 mg/day  ? ?Historical Background Evaluation: ?[place] PMP: PDMP reviewed prior to encounter. Online review of the past 591-month period conducted.             ?PMP NARX Score Report:  ?Narcotic: 381 ?Sedative: 210 ?Stimulant: 000 ?[place] Department of public safety, offender search: Engineer, mining(Public Information) Non-contributory ?Risk Assessment Profile: ?PMP NARX Overdose Risk Score: 220 ? ?Meds  ? ?Current Outpatient Medications:  ?  ALPRAZolam (XANAX) 0.5 MG tablet, Take 1 tablet (0.5 mg total) by mouth daily as needed for anxiety or sleep., Disp: 10 tablet, Rfl: 0 ?  Butalbital-APAP-Caffeine 50-300-40 MG CAPS, Take 1 tablet by mouth 4 (four) times daily as needed for migraine., Disp: , Rfl:  ?  diclofenac Sodium (VOLTAREN) 1 % GEL, Apply 1 application topically 2 (two) times daily. , Disp: , Rfl:  ?  ferrous sulfate 325 (65 FE) MG tablet, Take 325 mg by mouth daily with breakfast., Disp: , Rfl:  ?  fluticasone (FLONASE) 50 MCG/ACT nasal spray, Place 1 spray into both nostrils daily as needed for rhinitis., Disp: , Rfl:  ?  HYDROcodone-acetaminophen (NORCO/VICODIN) 5-325 MG tablet, Take 1 tablet by mouth every 4 (four) hours as needed for moderate pain., Disp: 40 tablet,  Rfl: 0 ?  ibuprofen (ADVIL) 200 MG tablet, Take 200 mg by mouth daily as needed for fever or headache (pain)., Disp: , Rfl:  ?  lisinopril (ZESTRIL) 20 MG tablet, Take 20 mg by mouth daily., Disp: , Rfl:  ?  pantoprazole (PROTONIX) 40 MG tablet, Take 40 mg by mouth at bedtime. , Disp: , Rfl:  ?  polyethylene glycol (MIRALAX / GLYCOLAX) 17 g packet, Take 17 g by mouth daily as needed (constipation)., Disp: , Rfl:  ?  promethazine (PHENERGAN) 25 MG tablet, Take 25 mg by mouth every 6 (six) hours as needed for nausea or vomiting. , Disp: , Rfl:  ?  Vitamin D, Ergocalciferol, (DRISDOL) 1.25 MG (50000 UNIT) CAPS capsule, Take 50,000 Units by mouth every Wednesday. , Disp: , Rfl:  ? ?Imaging Review  ?Lumbosacral Imaging: ?Lumbar MR wo contrast: Results for orders placed during the hospital encounter of 07/03/19 ?MR LUMBAR SPINE WO CONTRAST ? ?Narrative ?CLINICAL DATA:  Low back pain, increased fracture risk. Recent hip ?replacement. ? ?EXAM: ?MRI LUMBAR SPINE WITHOUT CONTRAST ? ?TECHNIQUE: ?Multiplanar, multisequence MR imaging of the lumbar spine was ?performed. No intravenous contrast was administered. ? ?COMPARISON:  CT lumbar spine 07/05/2019 ? ?FINDINGS: ?Segmentation:  Normal ? ?Alignment: Moderate to severe lumbar scoliosis. Normal sagittal ?alignment ? ?Vertebrae: Negative for acute fracture or mass. Severe chronic ?compression fracture T12 vertebral body without bone marrow edema. ?No change from recent CT. ? ?Conus medullaris and cauda equina: Conus extends to the L1-2 level. ?Conus and  cauda equina appear normal. ? ?Paraspinal and other soft tissues: Negative for paraspinous mass or ?adenopathy. Bilateral renal cysts. ? ?Disc levels: ? ?T12-L1: Negative for stenosis.  Mild disc and facet degeneration ? ?L1-2: Moderate disc degeneration and spurring. Mild spinal stenosis ?and mild facet degeneration bilaterally. ? ?L2-3: Mild disc bulging. Small left sided disc protrusion without ?neural impingement. Moderate  facet hypertrophy bilaterally. Mild ?spinal stenosis. ? ?L3-4: Asymmetric disc degeneration and spurring on the right. ?Moderate subarticular stenosis on the right with expected ?impingement of the right L4 nerve root. Mild spinal stenosis ? ?L4-5: Disc degeneration with disc space narrowing and endplate ?spurring. Moderate to severe subarticular stenosis on the left. ?Bilateral facet degeneration. Spinal canal adequate in size ? ?L5-S1: Mild disc degeneration. Moderate facet degeneration. Mild ?subarticular stenosis on the right. ? ?IMPRESSION: ?Negative for acute fracture. Severe chronic compression fracture T12 ? ?Moderate to severe scoliosis. Multilevel degenerative changes above. ? ? ?Electronically Signed ?By: Marlan Palau M.D. ?On: 07/06/2019 19:22 ? ?Lumbar CT wo contrast: Results for orders placed during the hospital encounter of 07/03/19 ?CT LUMBAR SPINE WO CONTRAST ? ?Narrative ?CLINICAL DATA:  88 year old female with low back pain. Recent fall ?with hip fracture and ORIF left hip. ? ?EXAM: ?CT LUMBAR SPINE WITHOUT CONTRAST ? ?TECHNIQUE: ?Multidetector CT imaging of the lumbar spine was performed without ?intravenous contrast administration. Multiplanar CT image ?reconstructions were also generated. ? ?COMPARISON:  Hip and pelvis radiographs today. ? ?FINDINGS: ?Segmentation: Normal assuming hypoplastic ribs at T12, full size ?ribs on the left at T11 (hypoplastic right T11 rib). ? ?Alignment: Moderate scoliosis mostly levoconvex in the lumbar spine. ?Subsequent straightening of lumbar lordosis. Subtle anterolisthesis ?of L2 on L3. ? ?Vertebrae: Severe T12 compression fracture, although appears likely ?to be chronic. No acute endplate fracture lucency is evident. ? ?The visible T11 level appears intact. Lumbar vertebrae appear ?intact. Osteopenia. No sacral fracture is identified. SI joints ?appear intact. ? ?Paraspinal and other soft tissues: Extensive Calcified aortic ?atherosclerosis. Partially  visible gastric hiatal hernia. Simple ?fluid density left renal upper pole cyst. Mild ectasia of the ?abdominal aorta. Negative visible other noncontrast abdominal ?viscera. Lumbar paraspinal soft tissues appear negative. ? ?Disc levels: Intermittent severe lumbar disc and endplate ?degeneration, sparing L2-L3 and L5-S1. Vacuum disc and severe disc ?space loss elsewhere. ? ?Subsequent multifactorial mild to moderate spinal stenosis at L3-L4. ?Up to mild lumbar spinal stenosis elsewhere. ? ?IMPRESSION: ?1. Osteopenia. Severe T12 compression fracture although most likely ?chronic. No acute osseous abnormality identified in the lumbar spine ?or visible sacrum. Lumbar MRI (noncontrast) or Nuclear Medicine ?Whole-body Bone Scan would confirm as necessary. ? ?2. Moderate lumbar scoliosis with advanced disc and endplate ?degeneration. Up to moderate degenerative spinal stenosis at L3-L4. ? ?3. Aortic Atherosclerosis (ICD10-I70.0).  Hiatal hernia. ? ? ?Electronically Signed ?By: Odessa Fleming M.D. ?On: 07/05/2019 17:13 ? ?Hip Imaging: ?Hip-L DG 2-3 views: Results for orders placed during the hospital encounter of 07/03/19 ?DG HIP UNILAT WITH PELVIS 2-3 VIEWS LEFT ? ?Narrative ?CLINICAL DATA:  Hip fracture ? ?EXAM: ?DG HIP (WITH OR WITHOUT PELVIS) 2-3V LEFT ? ?COMPARISON:  None. ? ?FINDINGS: ?There is a mildly displaced medially angulated fracture of the left ?femoral neck. No dislocation. No other pelvic fracture. There is ?moderate left hip osteoarthrosis. ? ?IMPRESSION: ?Mildly displaced, medially angulated fracture of the left femoral ?neck. ? ? ?Electronically Signed ?By: Deatra Robinson M.D. ?On: 07/04/2019 06:35 ? ?Complexity Note: Imaging results reviewed.              ?          ? ?  Allergies  ?Ms. Konkel is allergic to codeine, penicillins, and sulfa antibiotics. ? ?Laboratory Chemistry Profile  ? ?Renal ?Lab Results  ?Component Value Date  ? BUN 12 07/08/2019  ? CREATININE 0.74 07/08/2019  ? GFRAA >60 07/08/2019  ?  GFRNONAA >60 07/08/2019  ? ?  Electrolytes ?Lab Results  ?Component Value Date  ? NA 138 07/08/2019  ? K 4.1 07/08/2019  ? CL 101 07/08/2019  ? CALCIUM 8.4 (L) 07/08/2019  ? ?  ?Hepatic ?Lab Results  ?Component Value

## 2021-05-29 ENCOUNTER — Ambulatory Visit (HOSPITAL_BASED_OUTPATIENT_CLINIC_OR_DEPARTMENT_OTHER): Payer: Medicare Other | Admitting: Pain Medicine

## 2021-05-29 DIAGNOSIS — K219 Gastro-esophageal reflux disease without esophagitis: Secondary | ICD-10-CM | POA: Insufficient documentation

## 2021-05-29 DIAGNOSIS — R937 Abnormal findings on diagnostic imaging of other parts of musculoskeletal system: Secondary | ICD-10-CM | POA: Insufficient documentation

## 2021-05-29 DIAGNOSIS — G894 Chronic pain syndrome: Secondary | ICD-10-CM | POA: Insufficient documentation

## 2021-05-29 DIAGNOSIS — I252 Old myocardial infarction: Secondary | ICD-10-CM | POA: Insufficient documentation

## 2021-05-29 DIAGNOSIS — M4186 Other forms of scoliosis, lumbar region: Secondary | ICD-10-CM | POA: Insufficient documentation

## 2021-05-29 DIAGNOSIS — Z79899 Other long term (current) drug therapy: Secondary | ICD-10-CM | POA: Insufficient documentation

## 2021-05-29 DIAGNOSIS — I700 Atherosclerosis of aorta: Secondary | ICD-10-CM | POA: Insufficient documentation

## 2021-05-29 DIAGNOSIS — K449 Diaphragmatic hernia without obstruction or gangrene: Secondary | ICD-10-CM | POA: Insufficient documentation

## 2021-05-29 DIAGNOSIS — S22080S Wedge compression fracture of T11-T12 vertebra, sequela: Secondary | ICD-10-CM | POA: Insufficient documentation

## 2021-05-29 DIAGNOSIS — M1990 Unspecified osteoarthritis, unspecified site: Secondary | ICD-10-CM | POA: Insufficient documentation

## 2021-05-29 DIAGNOSIS — M48061 Spinal stenosis, lumbar region without neurogenic claudication: Secondary | ICD-10-CM | POA: Insufficient documentation

## 2021-05-29 DIAGNOSIS — M5137 Other intervertebral disc degeneration, lumbosacral region: Secondary | ICD-10-CM | POA: Insufficient documentation

## 2021-05-29 DIAGNOSIS — G8929 Other chronic pain: Secondary | ICD-10-CM

## 2021-05-29 DIAGNOSIS — Z9181 History of falling: Secondary | ICD-10-CM | POA: Insufficient documentation

## 2021-05-29 DIAGNOSIS — M48062 Spinal stenosis, lumbar region with neurogenic claudication: Secondary | ICD-10-CM | POA: Insufficient documentation

## 2021-05-29 DIAGNOSIS — M4156 Other secondary scoliosis, lumbar region: Secondary | ICD-10-CM | POA: Insufficient documentation

## 2021-05-29 DIAGNOSIS — Z91199 Patient's noncompliance with other medical treatment and regimen due to unspecified reason: Secondary | ICD-10-CM

## 2021-05-29 DIAGNOSIS — M899 Disorder of bone, unspecified: Secondary | ICD-10-CM | POA: Insufficient documentation

## 2021-05-29 DIAGNOSIS — M810 Age-related osteoporosis without current pathological fracture: Secondary | ICD-10-CM | POA: Insufficient documentation

## 2021-05-29 DIAGNOSIS — Z789 Other specified health status: Secondary | ICD-10-CM | POA: Insufficient documentation

## 2021-05-29 DIAGNOSIS — M47817 Spondylosis without myelopathy or radiculopathy, lumbosacral region: Secondary | ICD-10-CM | POA: Insufficient documentation

## 2021-05-29 DIAGNOSIS — M5416 Radiculopathy, lumbar region: Secondary | ICD-10-CM | POA: Insufficient documentation

## 2021-07-03 ENCOUNTER — Ambulatory Visit: Payer: Medicare Other | Admitting: Pain Medicine

## 2021-10-20 ENCOUNTER — Other Ambulatory Visit: Payer: Self-pay

## 2021-10-20 ENCOUNTER — Emergency Department (HOSPITAL_COMMUNITY): Payer: Medicare Other

## 2021-10-20 ENCOUNTER — Observation Stay (HOSPITAL_COMMUNITY)
Admission: EM | Admit: 2021-10-20 | Discharge: 2021-10-21 | Disposition: A | Discharge status: Home / Self Care | Payer: Medicare Other | Attending: Family Medicine | Admitting: Family Medicine

## 2021-10-20 ENCOUNTER — Encounter (HOSPITAL_COMMUNITY): Payer: Self-pay | Admitting: *Deleted

## 2021-10-20 ENCOUNTER — Observation Stay (HOSPITAL_COMMUNITY): Payer: Medicare Other

## 2021-10-20 DIAGNOSIS — M25531 Pain in right wrist: Secondary | ICD-10-CM | POA: Diagnosis present

## 2021-10-20 DIAGNOSIS — I10 Essential (primary) hypertension: Secondary | ICD-10-CM | POA: Diagnosis not present

## 2021-10-20 DIAGNOSIS — L03113 Cellulitis of right upper limb: Principal | ICD-10-CM | POA: Insufficient documentation

## 2021-10-20 DIAGNOSIS — E876 Hypokalemia: Secondary | ICD-10-CM | POA: Insufficient documentation

## 2021-10-20 DIAGNOSIS — M7989 Other specified soft tissue disorders: Secondary | ICD-10-CM

## 2021-10-20 DIAGNOSIS — K219 Gastro-esophageal reflux disease without esophagitis: Secondary | ICD-10-CM | POA: Diagnosis present

## 2021-10-20 DIAGNOSIS — F0390 Unspecified dementia without behavioral disturbance: Secondary | ICD-10-CM | POA: Diagnosis not present

## 2021-10-20 DIAGNOSIS — F419 Anxiety disorder, unspecified: Secondary | ICD-10-CM | POA: Diagnosis present

## 2021-10-20 DIAGNOSIS — L0390 Cellulitis, unspecified: Secondary | ICD-10-CM | POA: Diagnosis present

## 2021-10-20 DIAGNOSIS — Z96642 Presence of left artificial hip joint: Secondary | ICD-10-CM | POA: Insufficient documentation

## 2021-10-20 DIAGNOSIS — R2231 Localized swelling, mass and lump, right upper limb: Secondary | ICD-10-CM | POA: Insufficient documentation

## 2021-10-20 DIAGNOSIS — Z79899 Other long term (current) drug therapy: Secondary | ICD-10-CM | POA: Diagnosis not present

## 2021-10-20 DIAGNOSIS — D649 Anemia, unspecified: Secondary | ICD-10-CM | POA: Diagnosis present

## 2021-10-20 HISTORY — DX: Unspecified osteoarthritis, unspecified site: M19.90

## 2021-10-20 HISTORY — DX: Pneumonia, unspecified organism: J18.9

## 2021-10-20 HISTORY — DX: Headache, unspecified: R51.9

## 2021-10-20 HISTORY — DX: Unspecified dementia, unspecified severity, without behavioral disturbance, psychotic disturbance, mood disturbance, and anxiety: F03.90

## 2021-10-20 LAB — BASIC METABOLIC PANEL
Anion gap: 8 (ref 5–15)
BUN: 14 mg/dL (ref 8–23)
CO2: 25 mmol/L (ref 22–32)
Calcium: 8.2 mg/dL — ABNORMAL LOW (ref 8.9–10.3)
Chloride: 105 mmol/L (ref 98–111)
Creatinine, Ser: 0.76 mg/dL (ref 0.44–1.00)
GFR, Estimated: >60 mL/min (ref >60)
Glucose, Bld: 96 mg/dL (ref 70–99)
Potassium: 3.5 mmol/L (ref 3.5–5.1)
Sodium: 138 mmol/L (ref 135–145)

## 2021-10-20 LAB — CBC WITH DIFFERENTIAL/PLATELET
Abs Immature Granulocytes: 0.01 10*3/uL (ref 0.00–0.07)
Basophils Absolute: 0 10*3/uL (ref 0.0–0.1)
Basophils Relative: 1 %
Eosinophils Absolute: 0.2 10*3/uL (ref 0.0–0.5)
Eosinophils Relative: 4 %
HCT: 35.9 % — ABNORMAL LOW (ref 36.0–46.0)
Hemoglobin: 10.8 g/dL — ABNORMAL LOW (ref 12.0–15.0)
Immature Granulocytes: 0 %
Lymphocytes Relative: 24 %
Lymphs Abs: 1.3 10*3/uL (ref 0.7–4.0)
MCH: 27.3 pg (ref 26.0–34.0)
MCHC: 30.1 g/dL (ref 30.0–36.0)
MCV: 90.7 fL (ref 80.0–100.0)
Monocytes Absolute: 0.6 10*3/uL (ref 0.1–1.0)
Monocytes Relative: 10 %
Neutro Abs: 3.3 10*3/uL (ref 1.7–7.7)
Neutrophils Relative %: 61 %
Platelets: 227 10*3/uL (ref 150–400)
RBC: 3.96 MIL/uL (ref 3.87–5.11)
RDW: 22.4 % — ABNORMAL HIGH (ref 11.5–15.5)
WBC: 5.4 10*3/uL (ref 4.0–10.5)
nRBC: 0 % (ref 0.0–0.2)

## 2021-10-20 LAB — C-REACTIVE PROTEIN: CRP: 1.6 mg/dL — ABNORMAL HIGH (ref <1.0)

## 2021-10-20 LAB — SEDIMENTATION RATE: Sed Rate: 17 mm/hr (ref 0–22)

## 2021-10-20 MED ORDER — ACETAMINOPHEN 325 MG PO TABS: 650.0000 mg | ORAL_TABLET | Freq: Four times a day (QID) | ORAL | Status: DC | PRN

## 2021-10-20 MED ORDER — ALPRAZOLAM 0.5 MG PO TABS: 0.5000 mg | ORAL_TABLET | Freq: Every day | ORAL | Status: DC | PRN

## 2021-10-20 MED ORDER — ACETAMINOPHEN 500 MG PO TABS
1000.0000 mg | ORAL_TABLET | Freq: Once | ORAL | Status: AC
  Administered 2021-10-20: 1000 mg via ORAL
  Filled 2021-10-20: qty 2

## 2021-10-20 MED ORDER — PANTOPRAZOLE SODIUM 40 MG PO TBEC
40.0000 mg | DELAYED_RELEASE_TABLET | Freq: Every day | ORAL | Status: DC
  Administered 2021-10-20: 40 mg via ORAL
  Filled 2021-10-20: qty 1

## 2021-10-20 MED ORDER — SODIUM CHLORIDE 0.9 % IV SOLN
1.0000 g | INTRAVENOUS | Status: AC
  Administered 2021-10-20: 1 g via INTRAVENOUS
  Filled 2021-10-20: qty 10

## 2021-10-20 MED ORDER — LISINOPRIL 10 MG PO TABS
20.0000 mg | ORAL_TABLET | Freq: Every day | ORAL | Status: DC
  Administered 2021-10-21: 20 mg via ORAL
  Filled 2021-10-20: qty 2

## 2021-10-20 MED ORDER — OXYCODONE HCL 5 MG PO TABS
5.0000 mg | ORAL_TABLET | ORAL | Status: DC | PRN
  Administered 2021-10-21: 5 mg via ORAL
  Filled 2021-10-20: qty 1

## 2021-10-20 MED ORDER — ACETAMINOPHEN 650 MG RE SUPP: 650.0000 mg | Freq: Four times a day (QID) | RECTAL | Status: DC | PRN

## 2021-10-20 MED ORDER — HEPARIN SODIUM (PORCINE) 5000 UNIT/ML IJ SOLN
5000.0000 [IU] | Freq: Three times a day (TID) | INTRAMUSCULAR | Status: DC
  Administered 2021-10-21: 5000 [IU] via SUBCUTANEOUS
  Filled 2021-10-20: qty 1

## 2021-10-20 MED ORDER — SODIUM CHLORIDE 0.9 % IV SOLN: 2.0000 g | INTRAVENOUS | Status: DC

## 2021-10-20 MED ORDER — FERROUS SULFATE 325 (65 FE) MG PO TABS
325.0000 mg | ORAL_TABLET | Freq: Every day | ORAL | Status: DC
  Administered 2021-10-21: 325 mg via ORAL
  Filled 2021-10-20: qty 1

## 2021-10-20 MED ORDER — VANCOMYCIN HCL IN DEXTROSE 1-5 GM/200ML-% IV SOLN
1000.0000 mg | Freq: Once | INTRAVENOUS | Status: AC
  Administered 2021-10-20: 1000 mg via INTRAVENOUS
  Filled 2021-10-20: qty 200

## 2021-10-20 MED ORDER — VANCOMYCIN HCL 750 MG/150ML IV SOLN: 750.0000 mg | INTRAVENOUS | Status: DC

## 2021-10-20 MED ORDER — ONDANSETRON HCL 4 MG/2ML IJ SOLN: 4.0000 mg | Freq: Four times a day (QID) | INTRAMUSCULAR | Status: DC | PRN

## 2021-10-20 MED ORDER — ONDANSETRON HCL 4 MG PO TABS
4.0000 mg | ORAL_TABLET | Freq: Four times a day (QID) | ORAL | Status: DC | PRN
  Administered 2021-10-21: 4 mg via ORAL
  Filled 2021-10-20: qty 1

## 2021-10-20 NOTE — ED Triage Notes (Signed)
Pt brought in by [facility] for c/o right hand swelling; pt is warm to the touch;  Family reported to ems pt had the same condition to happen in the past and she had to be admitted  Pt has hx of dementia and does not know why she is here

## 2021-10-20 NOTE — Progress Notes (Signed)
Pharmacy Antibiotic Note  Sabrina Glass is a 88 y.o. female admitted on 10/20/2021 with cellulitis.  Pharmacy has been consulted for vancomycin dosing.  Plan: Vancomycin 750 IV every 48 hours.  Goal trough 10-15 mcg/mL. Goal AUC 400-500 mcg*h/ml  Height: 5\' 1"  (154.9 cm) Weight: 40.4 kg (89 lb) IBW/kg (Calculated) : 47.8  Temp (24hrs), Avg:98.2 F (36.8 C), Min:98.2 F (36.8 C), Max:98.2 F (36.8 C)  Recent Labs  Lab 10/20/21 1603  WBC 5.4  CREATININE 0.76    Estimated Creatinine Clearance: 28.6 mL/min (by C-G formula based on SCr of 0.76 mg/dL).    Allergies  Allergen Reactions   Codeine Nausea And Vomiting, Swelling and Rash    Throat swelling (pt can take percocet)   Penicillins Nausea And Vomiting, Swelling and Rash    Throat swelling   Sulfa Antibiotics Nausea And Vomiting, Swelling and Rash    Throat swelling    Antimicrobials this admission: Vancomycin 1000mg  x1 9/29 Ceftriaxone 1g x1 9/29  Dose adjustments this admission: N/a  Microbiology results: 9/29 BCx: pending  Thank you for allowing pharmacy to be a part of this patient's care.  [name] [name] 10/20/2021 8:18 PM

## 2021-10-20 NOTE — ED Provider Notes (Signed)
[facility] EMERGENCY DEPARTMENT Provider Note   CSN: [id number] Arrival date & time: 10/20/21  1509     History  No chief complaint on file.   Sabrina Glass is a 88 y.o. female.  88 year old female with a history of dementia, anemia, HTN, MI, and chronic pain who presents emergency department with right hand swelling.  Patient states that she was in her usual state of health until 2 days ago when she noticed a bug bite on her right hand near her second digit MCP.  States that she noticed redness and swelling that spread up her arm since then.  Says she is now having pain in the dorsum of her hand as well as her wrist.  Denies any trauma to the areas.  Has been having chills but no fevers.  Lives with family who was concerned so they called 911 to have her evaluated in the emergency department.   Called Niece [name] at [phone]. States that she had an infection that she was septic from. Says she was in the ICU for that infection. Started having 2 days of infection. Was small and started turning red. Says that there was mild swelling. But that it worsened today so they brought her into the emergency department. No fevers. Lives with her daughter [name] who can be reached at [phone].    Past Medical History:  Diagnosis Date   Hypertension       Home Medications Prior to Admission medications   Medication Sig Start Date End Date Taking? Authorizing Provider  ALPRAZolam Prudy Feeler) 0.5 MG tablet Take 1 tablet (0.5 mg total) by mouth daily as needed for anxiety or sleep. 07/08/19   Hughie Closs, MD  Butalbital-APAP-Caffeine 50-300-40 MG CAPS Take 1 tablet by mouth 4 (four) times daily as needed for migraine. 06/03/19   [provider]  diclofenac Sodium (VOLTAREN) 1 % GEL Apply 1 application topically 2 (two) times daily.  06/25/19   [provider]  ferrous sulfate 325 (65 FE) MG tablet Take 325 mg by mouth daily with breakfast.    [provider]   fluticasone (FLONASE) 50 MCG/ACT nasal spray Place 1 spray into both nostrils daily as needed for rhinitis. 06/09/19   [provider]  HYDROcodone-acetaminophen (NORCO/VICODIN) 5-325 MG tablet Take 1 tablet by mouth every 4 (four) hours as needed for moderate pain. 07/07/19   Swinteck, Arlys John, MD  ibuprofen (ADVIL) 200 MG tablet Take 200 mg by mouth daily as needed for fever or headache (pain).    [provider]  lisinopril (ZESTRIL) 20 MG tablet Take 20 mg by mouth daily. 06/09/19   [provider]  pantoprazole (PROTONIX) 40 MG tablet Take 40 mg by mouth at bedtime.  06/08/19   [provider]  polyethylene glycol (MIRALAX / GLYCOLAX) 17 g packet Take 17 g by mouth daily as needed (constipation).    [provider]  promethazine (PHENERGAN) 25 MG tablet Take 25 mg by mouth every 6 (six) hours as needed for nausea or vomiting.  03/24/19   [provider]  Vitamin D, Ergocalciferol, (DRISDOL) 1.25 MG (50000 UNIT) CAPS capsule Take 50,000 Units by mouth every Wednesday.  06/17/19   [provider]      Allergies    Codeine, Penicillins, and Sulfa antibiotics    Review of Systems   Review of Systems  Physical Exam Updated Vital Signs There were no vitals taken for this visit. Physical Exam  ED Results / Procedures / Treatments  Labs (all labs ordered are listed, but only abnormal results are displayed) Labs Reviewed - No data to display  EKG None  Radiology No results found.  Procedures Procedures  {Document cardiac monitor, telemetry assessment procedure when appropriate:1}  Medications Ordered in ED Medications - No data to display  ED Course/ Medical Decision Making/ A&P                           Medical Decision Making Amount and/or Complexity of Data Reviewed Labs: ordered. Radiology: ordered.  Risk OTC drugs.   ***  {Document critical care time when appropriate:1} {Document review of labs and  clinical decision tools ie heart score, Chads2Vasc2 etc:1}  {Document your independent review of radiology images, and any outside records:1} {Document your discussion with family members, caretakers, and with consultants:1} {Document social determinants of health affecting pt's care:1} {Document your decision making why or why not admission, treatments were needed:1} Final Clinical Impression(s) / ED Diagnoses Final diagnoses:  None    Rx / DC Orders ED Discharge Orders     None

## 2021-10-21 DIAGNOSIS — I10 Essential (primary) hypertension: Secondary | ICD-10-CM | POA: Diagnosis not present

## 2021-10-21 DIAGNOSIS — D649 Anemia, unspecified: Secondary | ICD-10-CM

## 2021-10-21 DIAGNOSIS — K219 Gastro-esophageal reflux disease without esophagitis: Secondary | ICD-10-CM | POA: Diagnosis not present

## 2021-10-21 DIAGNOSIS — F419 Anxiety disorder, unspecified: Secondary | ICD-10-CM | POA: Diagnosis not present

## 2021-10-21 DIAGNOSIS — L03113 Cellulitis of right upper limb: Secondary | ICD-10-CM | POA: Diagnosis not present

## 2021-10-21 LAB — COMPREHENSIVE METABOLIC PANEL
ALT: 12 U/L (ref 0–44)
AST: 19 U/L (ref 15–41)
Albumin: 2.8 g/dL — ABNORMAL LOW (ref 3.5–5.0)
Alkaline Phosphatase: 59 U/L (ref 38–126)
Anion gap: 6 (ref 5–15)
BUN: 12 mg/dL (ref 8–23)
CO2: 26 mmol/L (ref 22–32)
Calcium: 8.1 mg/dL — ABNORMAL LOW (ref 8.9–10.3)
Chloride: 106 mmol/L (ref 98–111)
Creatinine, Ser: 0.71 mg/dL (ref 0.44–1.00)
GFR, Estimated: >60 mL/min (ref >60)
Glucose, Bld: 82 mg/dL (ref 70–99)
Potassium: 3.2 mmol/L — ABNORMAL LOW (ref 3.5–5.1)
Sodium: 138 mmol/L (ref 135–145)
Total Bilirubin: 0.9 mg/dL (ref 0.3–1.2)
Total Protein: 5.9 g/dL — ABNORMAL LOW (ref 6.5–8.1)

## 2021-10-21 LAB — MAGNESIUM: Magnesium: 1.8 mg/dL (ref 1.7–2.4)

## 2021-10-21 LAB — TSH: TSH: 0.664 u[IU]/mL (ref 0.350–4.500)

## 2021-10-21 MED ORDER — DOXYCYCLINE HYCLATE 100 MG PO CAPS: 100.0000 mg | ORAL_CAPSULE | Freq: Every day | ORAL | 0 refills | Status: AC

## 2021-10-21 MED ORDER — POTASSIUM CHLORIDE CRYS ER 20 MEQ PO TBCR
30.0000 meq | EXTENDED_RELEASE_TABLET | Freq: Once | ORAL | Status: AC
  Administered 2021-10-21: 30 meq via ORAL
  Filled 2021-10-21: qty 1

## 2021-10-21 NOTE — TOC Progression Note (Signed)
  Transition of Care [facility]) Screening Note   Patient Details  Name: Sabrina Glass Date of Birth: [birth year]   Transition of Care [facility]) CM/SW Contact:    Boneta Lucks, RN Phone Number: 10/21/2021, 11:42 AM    Transition of Care Department [facility]) has reviewed patient and no TOC needs have been identified at this time. We will continue to monitor patient advancement through interdisciplinary progression rounds. If new patient transition needs arise, please place a TOC consult.

## 2021-10-21 NOTE — Assessment & Plan Note (Addendum)
-   Right wrist appears swollen, it was initially tender to palpation, but resolved now - X-ray wrist shows soft tissue edema - Patient started on Rocephin and vancomycin in the ED - treated with ceftriaxone for cellulitis order set - Blood cultures NGTD - Ultrasound DVT of right upper extremity negative for DVT --markedly improved with IV antibiotics --DC home today on oral doxycycline 100 mg daily x 3 day

## 2021-10-21 NOTE — Assessment & Plan Note (Signed)
Continue Protonix °

## 2021-10-21 NOTE — Hospital Course (Signed)
88 y.o. female with medical history significant of dementia, hypertension, GERD, anxiety, presents to ED with a chief complaint of right wrist pain.  Patient reports its been bothering her for about 2 weeks.  She is not sure when erythema and edema developed.  She reports she may have had a spider bite there.  She is not sure if she was started on antibiotic outpatient.  She thinks she may have seen a doctor about this already at least once.  She had a subjective fever with chills and diaphoresis at home.  Patient reports that her wrist hurts more when she uses it.  The pain is still there at rest but not as bad.  She tried Tylenol and Motrin and she does not know if it helped or not.  She has felt overly fatigued for the last week.  She reports she is not sleeping at night because of the pain in her wrist.  She is not sure if she has had a decreased appetite or any other symptoms.  No further history could be obtained at this time.

## 2021-10-21 NOTE — Assessment & Plan Note (Signed)
-   Continue Xanax from home med list

## 2021-10-21 NOTE — Assessment & Plan Note (Signed)
Continue lisinopril

## 2021-10-21 NOTE — Discharge Instructions (Signed)
IMPORTANT INFORMATION: PAY CLOSE ATTENTION   PHYSICIAN DISCHARGE INSTRUCTIONS  Follow with Primary care provider  System, Provider Not In  and other consultants as instructed by your Hospitalist Physician  SEEK MEDICAL CARE OR RETURN TO EMERGENCY ROOM IF SYMPTOMS COME BACK, WORSEN OR NEW PROBLEM DEVELOPS   Please note: You were cared for by a hospitalist during your hospital stay. Every effort will be made to forward records to your primary care provider.  You can request that your primary care provider send for your hospital records if they have not received them.  Once you are discharged, your primary care physician will handle any further medical issues. Please note that NO REFILLS for any discharge medications will be authorized once you are discharged, as it is imperative that you return to your primary care physician (or establish a relationship with a primary care physician if you do not have one) for your post hospital discharge needs so that they can reassess your need for medications and monitor your lab values.  Please get a complete blood count and chemistry panel checked by your Primary MD at your next visit, and again as instructed by your Primary MD.  Get Medicines reviewed and adjusted: Please take all your medications with you for your next visit with your Primary MD  Laboratory/radiological data: Please request your Primary MD to go over all hospital tests and procedure/radiological results at the follow up, please ask your primary care provider to get all Hospital records sent to his/her office.  In some cases, they will be blood work, cultures and biopsy results pending at the time of your discharge. Please request that your primary care provider follow up on these results.  If you are diabetic, please bring your blood sugar readings with you to your follow up appointment with primary care.    Please call and make your follow up appointments as soon as possible.    Also  Note the following: If you experience worsening of your admission symptoms, develop shortness of breath, life threatening emergency, suicidal or homicidal thoughts you must seek medical attention immediately by calling 911 or calling your MD immediately  if symptoms less severe.  You must read complete instructions/literature along with all the possible adverse reactions/side effects for all the Medicines you take and that have been prescribed to you. Take any new Medicines after you have completely understood and accpet all the possible adverse reactions/side effects.   Do not drive when taking Pain medications or sleeping medications (Benzodiazepines)  Do not take more than prescribed Pain, Sleep and Anxiety Medications. It is not advisable to combine anxiety,sleep and pain medications without talking with your primary care practitioner  Special Instructions: If you have smoked or chewed Tobacco  in the last 2 yrs please stop smoking, stop any regular Alcohol  and or any Recreational drug use.  Wear Seat belts while driving.  Do not drive if taking any narcotic, mind altering or controlled substances or recreational drugs or alcohol.       

## 2021-10-21 NOTE — H&P (Signed)
History and Physical    Patient: Sabrina Glass RDE:[id number] DOB: December 24, 1927 DOA: 10/20/2021 DOS: the patient was seen and examined on 10/21/2021 PCP: System, Provider Not In  Patient coming from: Home  Chief Complaint:  Chief Complaint  Patient presents with   Hand Pain   HPI: Sabrina Glass is a 88 y.o. female with medical history significant of dementia, hypertension, GERD, anxiety, presents to ED with a chief complaint of right wrist pain.  Patient reports its been bothering her for about 2 weeks.  She is not sure when erythema and edema developed.  She reports she may have had a spider bite there.  She is not sure if she was started on antibiotic outpatient.  She thinks she may have seen a doctor about this already at least once.  She had a subjective fever with chills and diaphoresis at home.  Patient reports that her wrist hurts more when she uses it.  The pain is still there at rest but not as bad.  She tried Tylenol and Motrin and she does not know if it helped or not.  She has felt overly fatigued for the last week.  She reports she is not sleeping at night because of the pain in her wrist.  She is not sure if she has had a decreased appetite or any other symptoms.  No further history could be obtained at this time. Review of Systems: unable to review all systems due to the inability of the patient to answer questions. Past Medical History:  Diagnosis Date   Arthritis    Dementia ([place])    Headache    Hypertension    Pneumonia    Past Surgical History:  Procedure Laterality Date   TOTAL HIP ARTHROPLASTY Left 07/05/2019   Procedure: TOTAL HIP ARTHROPLASTY ANTERIOR APPROACH;  Surgeon: Rod Can, MD;  Location: [facility];  Service: Orthopedics;  Laterality: Left;   Social History:  reports that she has never smoked. She has never used smokeless tobacco. She reports that she does not drink alcohol and does not use drugs.  Allergies  Allergen Reactions   Codeine Nausea And  Vomiting, Swelling and Rash    Throat swelling (pt can take percocet)   Penicillins Nausea And Vomiting, Swelling and Rash    Throat swelling   Sulfa Antibiotics Nausea And Vomiting, Swelling and Rash    Throat swelling    Family History  Problem Relation Age of Onset   Diabetes Mellitus II Father     Prior to Admission medications   Medication Sig Start Date End Date Taking? Authorizing Provider  ALPRAZolam Duanne Moron) 0.5 MG tablet Take 1 tablet (0.5 mg total) by mouth daily as needed for anxiety or sleep. 07/08/19   Darliss Cheney, MD  Butalbital-APAP-Caffeine 50-300-40 MG CAPS Take 1 tablet by mouth 4 (four) times daily as needed for migraine. 06/03/19   [provider]  diclofenac Sodium (VOLTAREN) 1 % GEL Apply 1 application topically 2 (two) times daily.  06/25/19   [provider]  ferrous sulfate 325 (65 FE) MG tablet Take 325 mg by mouth daily with breakfast.    [provider]  fluticasone (FLONASE) 50 MCG/ACT nasal spray Place 1 spray into both nostrils daily as needed for rhinitis. 06/09/19   [provider]  HYDROcodone-acetaminophen (NORCO/VICODIN) 5-325 MG tablet Take 1 tablet by mouth every 4 (four) hours as needed for moderate pain. 07/07/19   Swinteck, Aaron Edelman, MD  ibuprofen (ADVIL) 200 MG tablet Take 200 mg by mouth daily  as needed for fever or headache (pain).    [provider]  lisinopril (ZESTRIL) 20 MG tablet Take 20 mg by mouth daily. 06/09/19   [provider]  pantoprazole (PROTONIX) 40 MG tablet Take 40 mg by mouth at bedtime.  06/08/19   [provider]  polyethylene glycol (MIRALAX / GLYCOLAX) 17 g packet Take 17 g by mouth daily as needed (constipation).    [provider]  promethazine (PHENERGAN) 25 MG tablet Take 25 mg by mouth every 6 (six) hours as needed for nausea or vomiting.  03/24/19   [provider]  Vitamin D, Ergocalciferol, (DRISDOL) 1.25 MG (50000 UNIT) CAPS capsule Take 50,000  Units by mouth every Wednesday.  06/17/19   [provider]    Physical Exam: Vitals:   10/20/21 2030 10/20/21 2035 10/20/21 2100 10/20/21 2208  BP: (!) 162/74  (!) 144/80 (!) 167/67  Pulse: (!) 53 (!) 50 (!) 51 (!) 51  Resp: _0 Temp: 98.1 F (36.7 C)   98.1 F (36.7 C)  TempSrc: Oral   Oral  SpO2: 95% 93% 92% 94%  Weight:    37.7 kg  Height:    _1  (1.549 m)   1.  General: Patient lying supine in bed,  no acute distress   2. Psychiatric: Alert and oriented x person and place, mood and behavior normal for situation, pleasant and cooperative with exam   3. Neurologic: Speech and language are normal, face is symmetric, moves all 4 extremities voluntarily, at baseline without acute deficits on limited exam   4. HEENMT:  Head is atraumatic, normocephalic, pupils reactive to light, neck is supple, trachea is midline, mucous membranes are moist   5. Respiratory : Lungs are clear to auscultation bilaterally without wheezing, rhonchi, rales, no cyanosis, no increase in work of breathing or accessory muscle use   6. Cardiovascular : Heart rate normal, rhythm is regular,  rubs or gallops, no peripheral edema, peripheral pulses palpated   7. Gastrointestinal:  Abdomen is soft, nondistended, nontender to palpation bowel sounds active, no masses or organomegaly palpated   8. Skin:  Skin is warm, dry and intact, there is erythema around right wrist-but it is mild   9.Musculoskeletal:  Right wrist is edematous, tender to the touch, grip strength is intact, pain is worse with extension, better in neutral position, not comfortable in flexed position  Data Reviewed: In the ED Temp 98.2, heart rate 55-63, respiratory rate 18-20, blood pressure 161/72-178/87 satting at 92% No leukocytosis, hemoglobin 10.8 ESR 17 Wrist shows soft tissue edema Patient was started on Rocephin, Zithromax, Tylenol Patient requested for cellulitis, as ED PA did not think patient was a good  candidate for one-time dose antibiotic in the ER  Assessment and Plan: * Cellulitis - Right wrist appears swollen, it is tender to palpation - X-ray wrist shows soft tissue edema - Patient started on Rocephin and vancomycin in the ED - Continue Rocephin for cellulitis order set - Blood cultures pending - Continue to monitor  Anxiety - Continue Xanax from home med list  GERD (gastroesophageal reflux disease) - Continue Protonix  Anemia - No clinical signs of bleeding - Hemoglobin stable at 10.8 - Continue iron supplementation  HTN (hypertension) - Continue lisinopril      Advance Care Planning:   Code Status: Full Code   Consults: None  Family Communication: No family at bedside  Severity of Illness: The appropriate patient status for this patient is OBSERVATION. Observation  status is judged to be reasonable and necessary in order to provide the required intensity of service to ensure the patient's safety. The patient's presenting symptoms, physical exam findings, and initial radiographic and laboratory data in the context of their medical condition is felt to place them at decreased risk for further clinical deterioration. Furthermore, it is anticipated that the patient will be medically stable for discharge from the hospital within 2 midnights of admission.   Author: Rolla Plate, DO 10/21/2021 3:49 AM  For on call review [email or web address].

## 2021-10-21 NOTE — Assessment & Plan Note (Signed)
-   No clinical signs of bleeding - Hemoglobin stable at 10.8 - Continue iron supplementation

## 2021-10-21 NOTE — Discharge Summary (Addendum)
Physician Discharge Summary  Sabrina Glass GYI:[id number] DOB: 1927/10/25 DOA: 10/20/2021  Admit date: 10/20/2021 Discharge date: 10/21/2021  Admitted From: Home  Disposition: Home   Recommendations for Outpatient Follow-up:  Follow up with PCP in 1 weeks  Discharge Condition: STABLE   CODE STATUS: FULL DIET: resume prior home diet    Brief Hospitalization Summary: Please see all hospital notes, images, labs for full details of the hospitalization. 88 y.o. female with medical history significant of dementia, hypertension, GERD, anxiety, presents to ED with a chief complaint of right wrist pain.  Patient reports its been bothering her for about 2 weeks.  She is not sure when erythema and edema developed.  She reports she may have had a spider bite there.  She is not sure if she was started on antibiotic outpatient.  She thinks she may have seen a doctor about this already at least once.  She had a subjective fever with chills and diaphoresis at home.  Patient reports that her wrist hurts more when she uses it.  The pain is still there at rest but not as bad.  She tried Tylenol and Motrin and she does not know if it helped or not.  She has felt overly fatigued for the last week.  She reports she is not sleeping at night because of the pain in her wrist.  She is not sure if she has had a decreased appetite or any other symptoms.  No further history could be obtained at this time.  Hospital Course by problem    * Cellulitis - Right wrist appears swollen, it was initially tender to palpation, but resolved now - X-ray wrist shows soft tissue edema - Patient started on Rocephin and vancomycin in the ED - treated with ceftriaxone for cellulitis order set - Blood cultures NGTD - Ultrasound DVT of right upper extremity negative for DVT --markedly improved with IV antibiotics --DC home today on oral doxycycline 100 mg daily x 3 day   Hypokalemia - treated with supplementation   Anxiety -  Continue Xanax from home med list  GERD (gastroesophageal reflux disease) - Continue Protonix  Anemia - No clinical signs of bleeding - Hemoglobin stable at 10.8 - Continue iron supplementation  HTN (hypertension) - Continue lisinopril   Discharge Diagnoses:  Principal Problem:   Cellulitis Active Problems:   HTN (hypertension)   Anemia   GERD (gastroesophageal reflux disease)   Anxiety   Discharge Instructions:  Allergies as of 10/21/2021       Reactions   Codeine Nausea And Vomiting, Swelling, Rash   Throat swelling (pt can take percocet)   Penicillins Nausea And Vomiting, Swelling, Rash   Throat swelling   Sulfa Antibiotics Nausea And Vomiting, Swelling, Rash   Throat swelling        Medication List     STOP taking these medications    ALPRAZolam 0.5 MG tablet Commonly known as: XANAX   HYDROcodone-acetaminophen 5-325 MG tablet Commonly known as: NORCO/VICODIN       TAKE these medications    cyanocobalamin 1000 MCG tablet Commonly known as: VITAMIN B12 Take 1,000 mcg by mouth daily.   doxycycline 100 MG capsule Commonly known as: VIBRAMYCIN Take 1 capsule (100 mg total) by mouth daily for 4 days.   escitalopram 10 MG tablet Commonly known as: LEXAPRO Take 10 mg by mouth daily.   ferrous sulfate 325 (65 FE) MG tablet Take 325 mg by mouth daily with breakfast.   lisinopril 40 MG tablet Commonly known  as: ZESTRIL Take 40 mg by mouth daily.   ondansetron 4 MG tablet Commonly known as: ZOFRAN Take 4 mg by mouth every 8 (eight) hours as needed for nausea/vomiting.   pantoprazole 40 MG tablet Commonly known as: PROTONIX Take 40 mg by mouth at bedtime.   polyethylene glycol 17 g packet Commonly known as: MIRALAX / GLYCOLAX Take 17 g by mouth daily as needed (constipation).   sucralfate 1 g tablet Commonly known as: CARAFATE Take 1 g by mouth 2 (two) times daily.   traMADol 50 MG tablet Commonly known as: ULTRAM Take 50 mg by mouth  2 (two) times daily as needed for pain.        Follow-up Information     Primary Care Provider. Schedule an appointment as soon as possible for a visit in 1 week(s).   Why: Hospital Follow Up               Allergies  Allergen Reactions   Codeine Nausea And Vomiting, Swelling and Rash    Throat swelling (pt can take percocet)   Penicillins Nausea And Vomiting, Swelling and Rash    Throat swelling   Sulfa Antibiotics Nausea And Vomiting, Swelling and Rash    Throat swelling   Allergies as of 10/21/2021       Reactions   Codeine Nausea And Vomiting, Swelling, Rash   Throat swelling (pt can take percocet)   Penicillins Nausea And Vomiting, Swelling, Rash   Throat swelling   Sulfa Antibiotics Nausea And Vomiting, Swelling, Rash   Throat swelling        Medication List     STOP taking these medications    ALPRAZolam 0.5 MG tablet Commonly known as: XANAX   HYDROcodone-acetaminophen 5-325 MG tablet Commonly known as: NORCO/VICODIN       TAKE these medications    cyanocobalamin 1000 MCG tablet Commonly known as: VITAMIN B12 Take 1,000 mcg by mouth daily.   doxycycline 100 MG capsule Commonly known as: VIBRAMYCIN Take 1 capsule (100 mg total) by mouth daily for 4 days.   escitalopram 10 MG tablet Commonly known as: LEXAPRO Take 10 mg by mouth daily.   ferrous sulfate 325 (65 FE) MG tablet Take 325 mg by mouth daily with breakfast.   lisinopril 40 MG tablet Commonly known as: ZESTRIL Take 40 mg by mouth daily.   ondansetron 4 MG tablet Commonly known as: ZOFRAN Take 4 mg by mouth every 8 (eight) hours as needed for nausea/vomiting.   pantoprazole 40 MG tablet Commonly known as: PROTONIX Take 40 mg by mouth at bedtime.   polyethylene glycol 17 g packet Commonly known as: MIRALAX / GLYCOLAX Take 17 g by mouth daily as needed (constipation).   sucralfate 1 g tablet Commonly known as: CARAFATE Take 1 g by mouth 2 (two) times daily.    traMADol 50 MG tablet Commonly known as: ULTRAM Take 50 mg by mouth 2 (two) times daily as needed for pain.        Procedures/Studies: US Venous Img Upper Uni Right(DVT)  Result Date: 10/20/2021 CLINICAL DATA:  Wrist swelling EXAM: Right UPPER EXTREMITY VENOUS DOPPLER ULTRASOUND TECHNIQUE: Gray-scale sonography with graded compression, as well as color Doppler and duplex ultrasound were performed to evaluate the upper extremity deep venous system from the level of the subclavian vein and including the jugular, axillary, basilic, radial, ulnar and upper cephalic vein. Spectral Doppler was utilized to evaluate flow at rest and with distal augmentation maneuvers. COMPARISON:  None Available. FINDINGS:  Contralateral Subclavian Vein: Respiratory phasicity is normal and symmetric with the symptomatic side. No evidence of thrombus. Normal compressibility. Internal Jugular Vein: No evidence of thrombus. Normal compressibility, respiratory phasicity and response to augmentation. Subclavian Vein: No evidence of thrombus. Normal compressibility, respiratory phasicity and response to augmentation. Axillary Vein: No evidence of thrombus. Normal compressibility, respiratory phasicity and response to augmentation. Cephalic Vein: No evidence of thrombus. Normal compressibility, respiratory phasicity and response to augmentation. Basilic Vein: No evidence of thrombus. Normal compressibility, respiratory phasicity and response to augmentation. Brachial Veins: No evidence of thrombus. Normal compressibility, respiratory phasicity and response to augmentation. Radial Veins: No evidence of thrombus.  Normal compressibility Ulnar Veins: No evidence of thrombus.  Normal compressibility, IMPRESSION: No evidence of DVT within the right upper extremity. Electronically Signed   By: Donavan Foil M.D.   On: 10/20/2021 21:29   DG Wrist Complete Right  Result Date: 10/20/2021 CLINICAL DATA:  Wrist swelling and pain EXAM: RIGHT  WRIST - COMPLETE 3+ VIEW COMPARISON:  None Available. FINDINGS: There is diffuse soft tissue edema. The bones appear osteopenic. No signs of acute fracture or dislocation. There are moderate to severe degenerative changes involving the basilar joint and first MCP joint. IMPRESSION: 1. Soft tissue edema. 2. No acute bone abnormality. 3. Osteopenia and degenerative joint disease. Electronically Signed   By: Kerby Moors M.D.   On: 10/20/2021 15:45     Subjective: Pt reports feeling better, pain is resolved now.    Discharge Exam: Vitals:   10/20/21 2208 10/21/21 0542  BP: (!) 167/67 (!) 170/70  Pulse: (!) 51 (!) 56  Resp: 19 17  Temp: 98.1 F (36.7 C) 98.5 F (36.9 C)  SpO2: 94% 95%   Vitals:   10/20/21 2035 10/20/21 2100 10/20/21 2208 10/21/21 0542  BP:  (!) 144/80 (!) 167/67 (!) 170/70  Pulse: (!) 50 (!) 51 (!) 51 (!) 56  Resp:  16 19 17   Temp:   98.1 F (36.7 C) 98.5 F (36.9 C)  TempSrc:   Oral Oral  SpO2: 93% 92% 94% 95%  Weight:   37.7 kg   Height:   5\' 1"  (1.549 m)    General: Pt is alert, awake, not in acute distress Cardiovascular: RRR, S1/S2 +, no rubs, no gallops Respiratory: CTA bilaterally, no wheezing, no rhonchi Abdominal: Soft, NT, ND, bowel sounds + Extremities: no edema, no cyanosis Skin: marked improvement in redness and edema of right upper extremity   The results of significant diagnostics from this hospitalization (including imaging, microbiology, ancillary and laboratory) are listed below for reference.    Microbiology: Recent Results (from the past 240 hour(s))  Blood culture (routine x 2)     Status: None (Preliminary result)   Collection Time: 10/20/21  8:14 PM   Specimen: BLOOD RIGHT ARM  Result Value Ref Range Status   Specimen Description BLOOD RIGHT ARM  Final   Special Requests   Final    BOTTLES DRAWN AEROBIC AND ANAEROBIC Blood Culture adequate volume   Culture   Final    NO GROWTH < 12 HOURS Performed at [facility], [address] [place], [place] [address]    Report Status PENDING  Incomplete  Blood culture (routine x 2)     Status: None (Preliminary result)   Collection Time: 10/20/21  8:21 PM   Specimen: BLOOD LEFT HAND  Result Value Ref Range Status   Specimen Description BLOOD LEFT HAND  Final   Special Requests   Final  BOTTLES DRAWN AEROBIC AND ANAEROBIC Blood Culture adequate volume   Culture   Final    NO GROWTH < 12 HOURS Performed at [facility], [address] [place], [place] [address]    Report Status PENDING  Incomplete    Labs: BNP (last 3 results) No results for input(s): "BNP" in the last 8760 hours. Basic Metabolic Panel: Recent Labs  Lab 10/20/21 1603 10/21/21 0456  NA 138 138  K 3.5 3.2*  CL 105 106  CO2 25 26  GLUCOSE 96 82  BUN 14 12  CREATININE 0.76 0.71  CALCIUM 8.2* 8.1*  MG  --  1.8   Liver Function Tests: Recent Labs  Lab 10/21/21 0456  AST 19  ALT 12  ALKPHOS 59  BILITOT 0.9  PROT 5.9*  ALBUMIN 2.8*   No results for input(s): "LIPASE", "AMYLASE" in the last 168 hours. No results for input(s): "AMMONIA" in the last 168 hours. CBC: Recent Labs  Lab 10/20/21 1603  WBC 5.4  NEUTROABS 3.3  HGB 10.8*  HCT 35.9*  MCV 90.7  PLT 227   Cardiac Enzymes: No results for input(s): "CKTOTAL", "CKMB", "CKMBINDEX", "TROPONINI" in the last 168 hours. BNP: Invalid input(s): "POCBNP" CBG: No results for input(s): "GLUCAP" in the last 168 hours. D-Dimer No results for input(s): "DDIMER" in the last 72 hours. Hgb A1c No results for input(s): "HGBA1C" in the last 72 hours. Lipid Profile No results for input(s): "CHOL", "HDL", "LDLCALC", "TRIG", "CHOLHDL", "LDLDIRECT" in the last 72 hours. Thyroid function studies Recent Labs    10/21/21 0456  TSH 0.664   Anemia work up No results for input(s): "VITAMINB12", "FOLATE", "FERRITIN", "TIBC", "IRON", "RETICCTPCT" in the last 72 hours. Urinalysis No results found for: "COLORURINE", "APPEARANCEUR", "LABSPEC",  "PHURINE", "GLUCOSEU", "HGBUR", "BILIRUBINUR", "KETONESUR", "PROTEINUR", "UROBILINOGEN", "NITRITE", "LEUKOCYTESUR" Sepsis Labs Recent Labs  Lab 10/20/21 1603  WBC 5.4   Microbiology Recent Results (from the past 240 hour(s))  Blood culture (routine x 2)     Status: None (Preliminary result)   Collection Time: 10/20/21  8:14 PM   Specimen: BLOOD RIGHT ARM  Result Value Ref Range Status   Specimen Description BLOOD RIGHT ARM  Final   Special Requests   Final    BOTTLES DRAWN AEROBIC AND ANAEROBIC Blood Culture adequate volume   Culture   Final    NO GROWTH < 12 HOURS Performed at [facility], [address], [place], [place] [address]    Report Status PENDING  Incomplete  Blood culture (routine x 2)     Status: None (Preliminary result)   Collection Time: 10/20/21  8:21 PM   Specimen: BLOOD LEFT HAND  Result Value Ref Range Status   Specimen Description BLOOD LEFT HAND  Final   Special Requests   Final    BOTTLES DRAWN AEROBIC AND ANAEROBIC Blood Culture adequate volume   Culture   Final    NO GROWTH < 12 HOURS Performed at [facility], [address], [place], [place] [address]    Report Status PENDING  Incomplete   Time coordinating discharge: 33 mins  SIGNED:  Irwin Brakeman, MD  [facility] 10/21/2021, 11:02 AM How to contact the [facility] Attending or Consulting provider [place] or covering provider during after hours [place], for this patient?  Check the care team in [facility] and look for a) attending/consulting [facility] provider listed and b) the [facility] team listed Log into [email or web address] and use [place]'s universal password to access. If you do not have the  password, please contact the hospital operator. Locate the [facility] provider you are looking for under [facility] and page to a number that you can be directly reached. If you still have difficulty reaching the provider, please page the [facility] (Director on Call) for the Hospitalists listed on amion for assistance.

## 2021-10-25 LAB — CULTURE, BLOOD (ROUTINE X 2)
Culture: NO GROWTH
Culture: NO GROWTH
Special Requests: ADEQUATE
Special Requests: ADEQUATE

## 2021-12-06 IMAGING — MR MR LUMBAR SPINE W/O CM
4 of 5 series · 32 of 48 positions shown · non-contrast
Comparison: CT lumbar spine 07/05/2019

CLINICAL DATA: Low back pain, increased fracture risk. Recent hip
replacement.

EXAM:
MRI LUMBAR SPINE WITHOUT CONTRAST
TECHNIQUE: Multiplanar, multisequence MR imaging of the lumbar spine was
performed. No intravenous contrast was administered.

[Series 5: T2 · sagittal · 4.0mm · 0.73mm/px · 7 of 18 slices shown (1 of 2)]
[im 1/18]
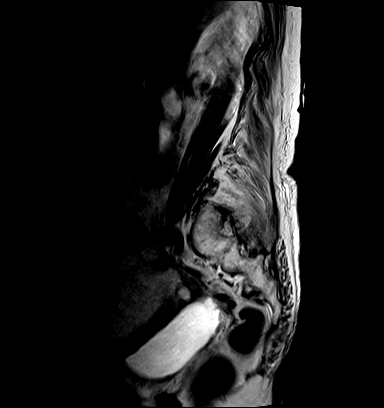
[im 3/18]
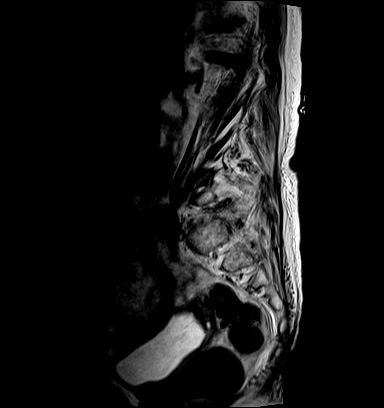
[im 6/18]
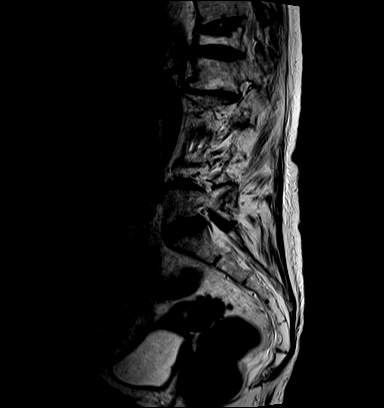
[im 9/18]
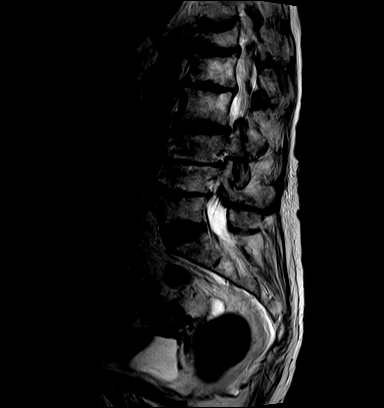
[im 12/18]
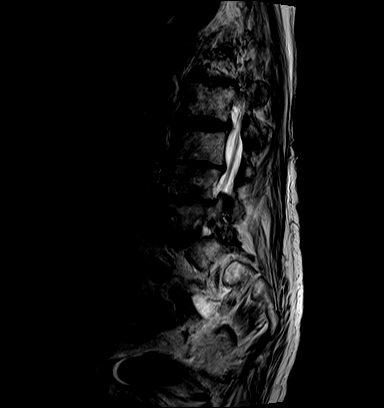
[im 15/18]
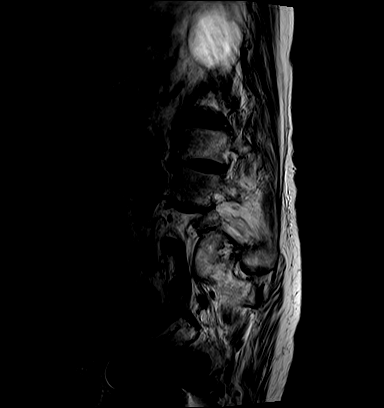
[im 18/18]
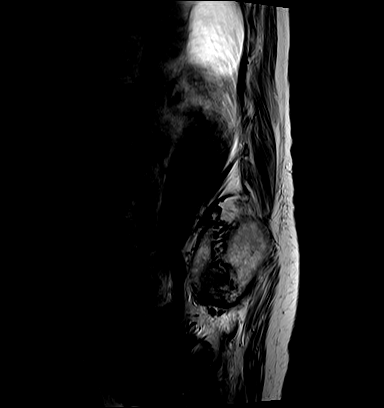

[Series 6: T1 · sagittal · 4.0mm · 0.88mm/px · 8 of 20 slices shown (1 of 2)]
[im 1/20]
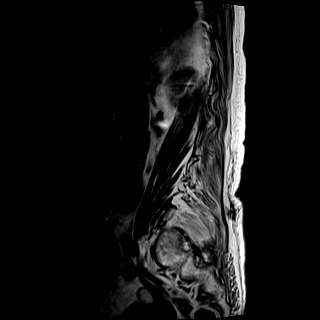
[im 3/20]
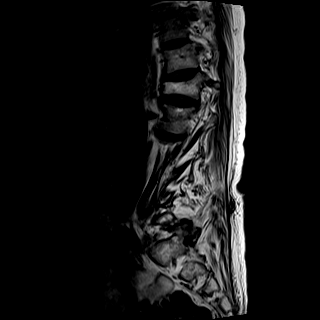
[im 6/20]
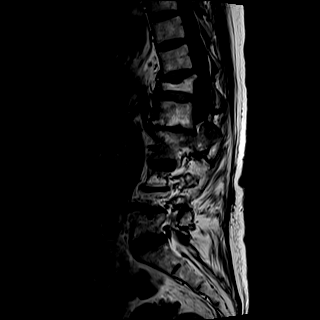
[im 9/20]
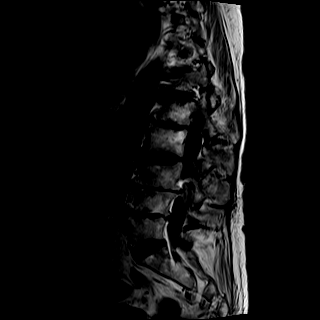
[im 11/20]
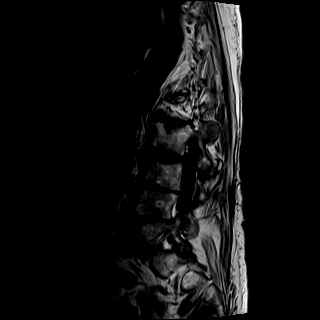
[im 14/20]
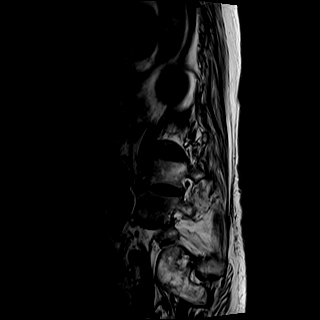
[im 17/20]
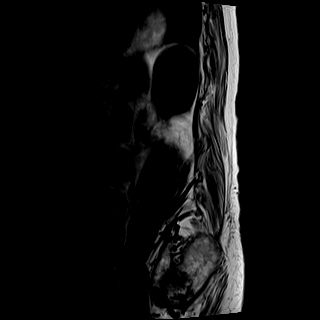
[im 20/20]
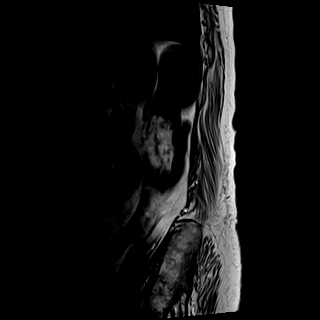

[Series 7: T2 · axial · 4.0mm · 0.86mm/px · z∈[-60,+124]mm · 9 of 33 slices shown (2 of 2)]
[im 1/33]
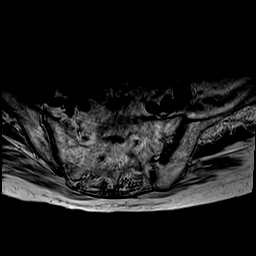
[im 6/33]
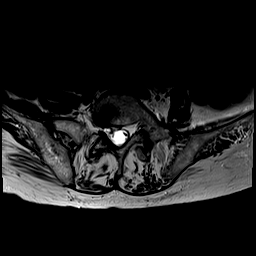
[im 11/33]
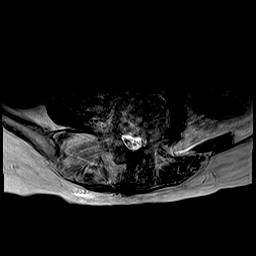
[im 14/33]
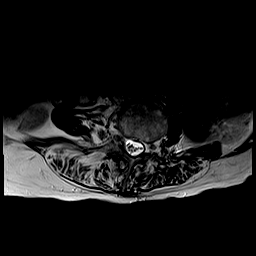
[im 17/33]
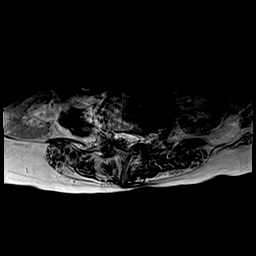
[im 19/33]
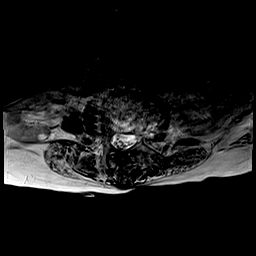
[im 22/33]
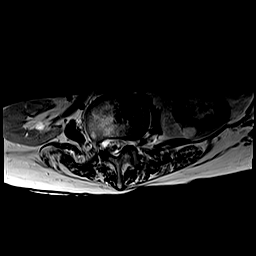
[im 27/33]
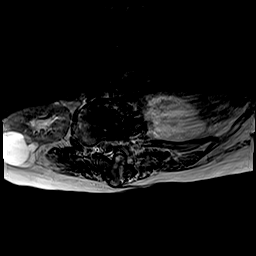
[im 33/33]
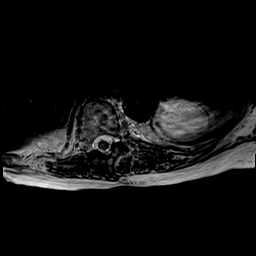

[Series 8: T1 · axial · 4.0mm · 0.43mm/px · z∈[-60,+94]mm · 8 of 33 slices shown (2 of 2)]
[im 1/33]
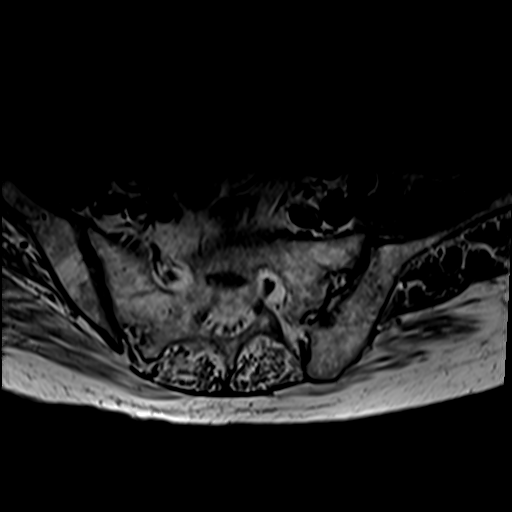
[im 6/33]
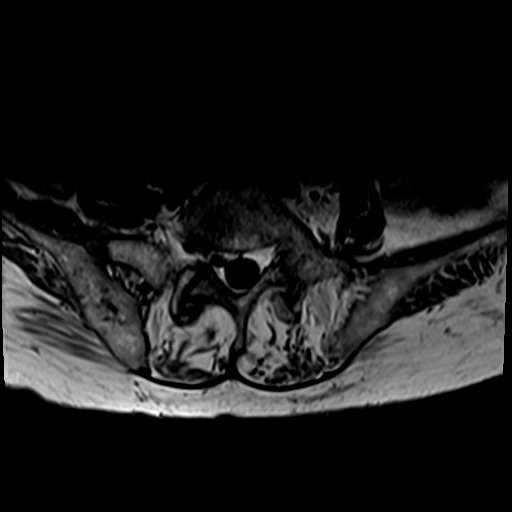
[im 11/33]
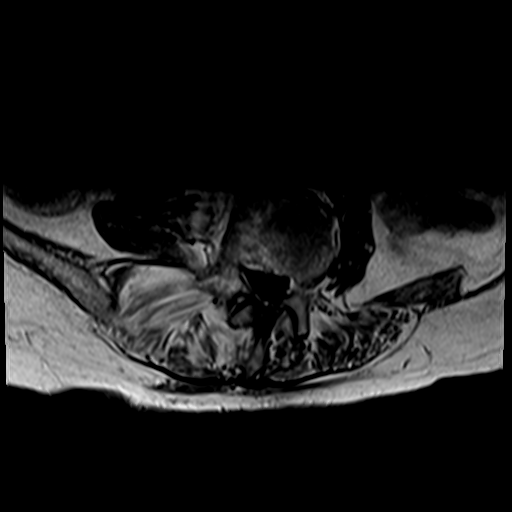
[im 14/33]
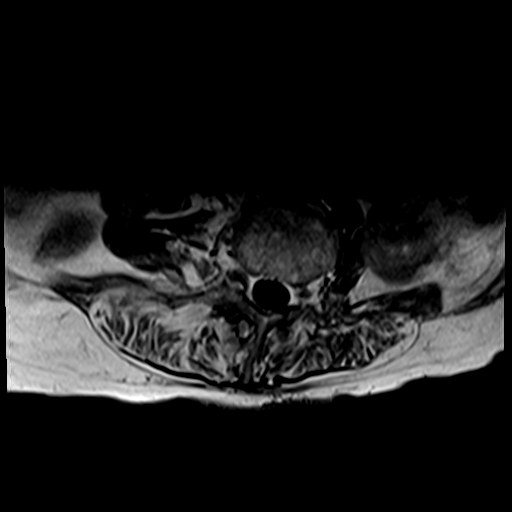
[im 17/33]
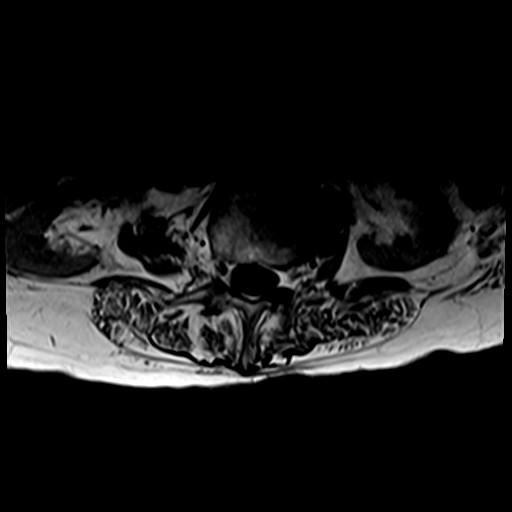
[im 19/33]
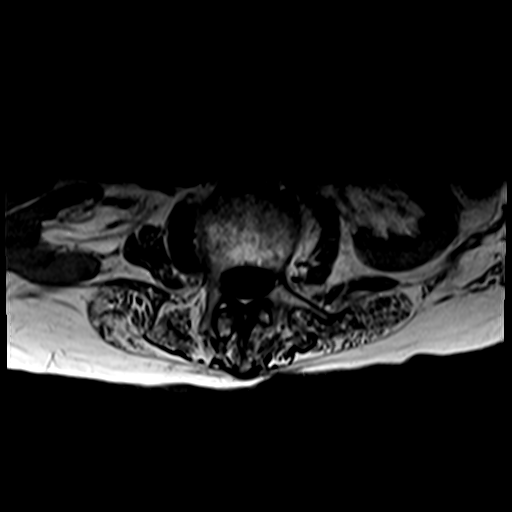
[im 22/33]
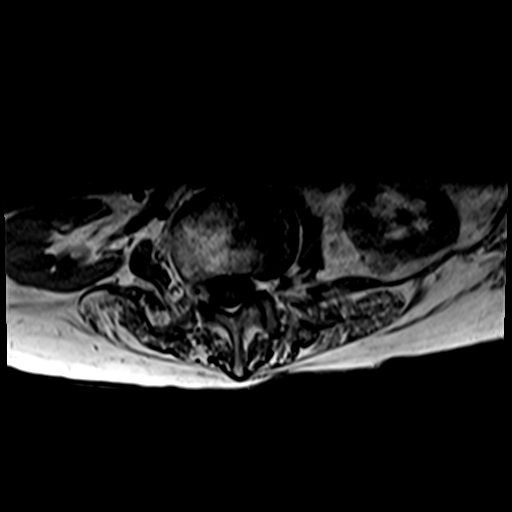
[im 27/33]
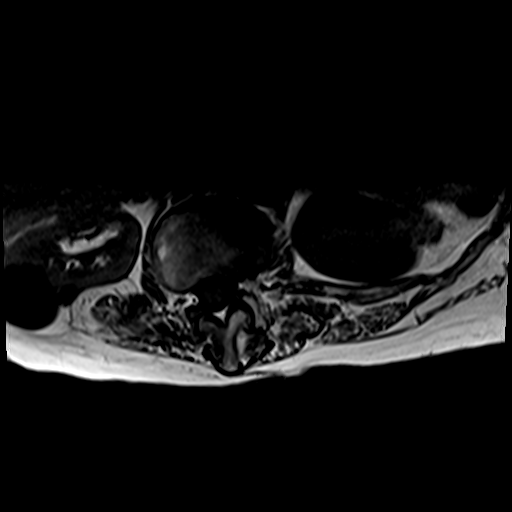

[32 of 48 positions shown; findings below may reference images not displayed]

FINDINGS: Segmentation:  Normal

Alignment: Moderate to severe lumbar scoliosis. Normal sagittal
alignment

Vertebrae: Negative for acute fracture or mass. Severe chronic
compression fracture T12 vertebral body without bone marrow edema.
No change from recent CT.

Conus medullaris and cauda equina: Conus extends to the L1-2 level.
Conus and cauda equina appear normal.

Paraspinal and other soft tissues: Negative for paraspinous mass or
adenopathy. Bilateral renal cysts.

Disc levels:

T12-L1: Negative for stenosis.  Mild disc and facet degeneration

L1-2: Moderate disc degeneration and spurring. Mild spinal stenosis
and mild facet degeneration bilaterally.

L2-3: Mild disc bulging. Small left sided disc protrusion without
neural impingement. Moderate facet hypertrophy bilaterally. Mild
spinal stenosis.

L3-4: Asymmetric disc degeneration and spurring on the right.
Moderate subarticular stenosis on the right with expected
impingement of the right L4 nerve root. Mild spinal stenosis

L4-5: Disc degeneration with disc space narrowing and endplate
spurring. Moderate to severe subarticular stenosis on the left.
Bilateral facet degeneration. Spinal canal adequate in size

L5-S1: Mild disc degeneration. Moderate facet degeneration. Mild
subarticular stenosis on the right.
IMPRESSION: Negative for acute fracture. Severe chronic compression fracture T12

Moderate to severe scoliosis. Multilevel degenerative changes above.

## 2021-12-09 ENCOUNTER — Encounter (HOSPITAL_COMMUNITY): Payer: Self-pay | Admitting: Emergency Medicine

## 2021-12-09 ENCOUNTER — Other Ambulatory Visit: Payer: Self-pay

## 2021-12-09 ENCOUNTER — Emergency Department (HOSPITAL_COMMUNITY)
Admission: EM | Admit: 2021-12-09 | Discharge: 2021-12-10 | Disposition: A | Discharge status: Home / Self Care | Payer: Medicare Other | Attending: Emergency Medicine | Admitting: Emergency Medicine

## 2021-12-09 DIAGNOSIS — S31000A Unspecified open wound of lower back and pelvis without penetration into retroperitoneum, initial encounter: Secondary | ICD-10-CM | POA: Insufficient documentation

## 2021-12-09 DIAGNOSIS — Z1152 Encounter for screening for COVID-19: Secondary | ICD-10-CM | POA: Insufficient documentation

## 2021-12-09 DIAGNOSIS — Z96642 Presence of left artificial hip joint: Secondary | ICD-10-CM | POA: Diagnosis not present

## 2021-12-09 DIAGNOSIS — R7309 Other abnormal glucose: Secondary | ICD-10-CM | POA: Insufficient documentation

## 2021-12-09 DIAGNOSIS — I10 Essential (primary) hypertension: Secondary | ICD-10-CM | POA: Insufficient documentation

## 2021-12-09 DIAGNOSIS — F0390 Unspecified dementia without behavioral disturbance: Secondary | ICD-10-CM | POA: Insufficient documentation

## 2021-12-09 DIAGNOSIS — M791 Myalgia, unspecified site: Secondary | ICD-10-CM | POA: Diagnosis present

## 2021-12-09 DIAGNOSIS — X58XXXA Exposure to other specified factors, initial encounter: Secondary | ICD-10-CM | POA: Insufficient documentation

## 2021-12-09 DIAGNOSIS — R52 Pain, unspecified: Secondary | ICD-10-CM

## 2021-12-09 LAB — CBG MONITORING, ED: Glucose-Capillary: 103 mg/dL — ABNORMAL HIGH (ref 70–99)

## 2021-12-09 NOTE — ED Notes (Signed)
PT continues calling for her family members. RN reorients and attempts to calm her.

## 2021-12-09 NOTE — ED Notes (Signed)
Pt given multiple warm blankets after calling out she is cold.

## 2021-12-09 NOTE — ED Provider Notes (Signed)
[facility] Emergency Department Provider Note MRN:  [id number]  Arrival date & time: 12/10/21     Chief Complaint   Generalized Body Aches   History of Present Illness   Sabrina Glass is a 88 y.o. year-old female with a history of dementia, hypertension presenting to the ED with chief complaint of body aches.  Patient here with diffuse body aches, also with a sacral wound.  Dropped off by family.  Oriented to name only.  Review of Systems  I was unable to obtain a full/accurate HPI, PMH, or ROS due to the patient's dementia.  Patient's Health History    Past Medical History:  Diagnosis Date   Arthritis    Dementia ([place])    Headache    Hypertension    Pneumonia     Past Surgical History:  Procedure Laterality Date   TOTAL HIP ARTHROPLASTY Left 07/05/2019   Procedure: TOTAL HIP ARTHROPLASTY ANTERIOR APPROACH;  Surgeon: Rod Can, MD;  Location: [facility];  Service: Orthopedics;  Laterality: Left;    Family History  Problem Relation Age of Onset   Diabetes Mellitus II Father     Social History   Socioeconomic History   Marital status: Widowed    Spouse name: Not on file   Number of children: Not on file   Years of education: Not on file   Highest education level: Not on file  Occupational History   Not on file  Tobacco Use   Smoking status: Never   Smokeless tobacco: Never  Substance and Sexual Activity   Alcohol use: Never   Drug use: Never   Sexual activity: Not Currently  Other Topics Concern   Not on file  Social History Narrative   Not on file   Social Determinants of Health   Financial Resource Strain: Not on file  Food Insecurity: No Food Insecurity (10/20/2021)   Hunger Vital Sign    Worried About Running Out of Food in the Last Year: Never true    Ran Out of Food in the Last Year: Never true  Transportation Needs: No Transportation Needs (10/20/2021)   PRAPARE - Hydrologist (Medical): No     Lack of Transportation (Non-Medical): No  Physical Activity: Not on file  Stress: Not on file  Social Connections: Not on file  Intimate Partner Violence: Not At Risk (10/20/2021)   Humiliation, Afraid, Rape, and Kick questionnaire    Fear of Current or Ex-Partner: No    Emotionally Abused: No    Physically Abused: No    Sexually Abused: No     Physical Exam   Vitals:   12/09/21 2230 12/10/21 0137  BP: (!) 150/76 (!) 149/72  Pulse: (!) 56 (!) 59  Resp: 17 (!) 26  Temp:    SpO2: 98% 94%    CONSTITUTIONAL: Chronically ill-appearing, NAD NEURO/PSYCH:  Alert, oriented to name, moves all extremities EYES:  eyes equal and reactive ENT/NECK:  no LAD, no JVD CARDIO: Regular rate, well-perfused, normal S1 and S2 PULM:  CTAB no wheezing or rhonchi GI/GU:  non-distended, non-tender MSK/SPINE:  No gross deformities, no edema SKIN:  no rash, atraumatic   *Additional and/or pertinent findings included in MDM below  Diagnostic and Interventional Summary    EKG Interpretation  Date/Time:  Sunday December 10 2021 00:46:33 EST Ventricular Rate:  58 PR Interval:  158 QRS Duration: 95 QT Interval:  476 QTC Calculation: 468 R Axis:   -29 Text Interpretation: Sinus rhythm Borderline  left axis deviation Anterior infarct, old Borderline T abnormalities, inferior leads Confirmed by Kennis Carina [phone]) on 12/10/2021 1:26:12 AM       Labs Reviewed  CBC - Abnormal; Notable for the following components:      Result Value   RBC 3.15 (*)    Hemoglobin 9.4 (*)    HCT 30.6 (*)    RDW 17.2 (*)    All other components within normal limits  COMPREHENSIVE METABOLIC PANEL - Abnormal; Notable for the following components:   Chloride 112 (*)    BUN 27 (*)    Calcium 8.3 (*)    Total Protein 5.7 (*)    Albumin 2.9 (*)    All other components within normal limits  URINALYSIS, ROUTINE W REFLEX MICROSCOPIC - Abnormal; Notable for the following components:   Hgb urine dipstick MODERATE (*)     Protein, ur 30 (*)    All other components within normal limits  CK - Abnormal; Notable for the following components:   Total CK 17 (*)    All other components within normal limits  CBG MONITORING, ED - Abnormal; Notable for the following components:   Glucose-Capillary 103 (*)    All other components within normal limits  RESP PANEL BY RT-PCR (FLU A&B, COVID) [id number]    No orders to display    Medications  doxycycline (VIBRA-TABS) tablet 100 mg (has no administration in time range)     Procedures  /  Critical Care Procedures  ED Course and Medical Decision Making  Initial Impression and Ddx Body aches, considering electrolyte disturbance, rhabdomyolysis, viral illness, chronic pain.  The sacral wound is very mild, there is a small nodule with some overlying skin breakdown but no purulence, no fluctuance.  Could be a lipoma or possibly an inflamed or infected sebaceous cyst or small healing abscess.  Seems appropriate for trial of antibiotics, no signs of more significant contiguous or systemic infection at this time.  Past medical/surgical history that increases complexity of ED encounter: Chronic pain, dementia  Interpretation of Diagnostics I personally reviewed the EKG and and my interpretation is as follows: Sinus rhythm  No significant blood count or electrolyte disturbance, urinalysis reassuring.  Patient Reassessment and Ultimate Disposition/Management     Patient sleeping comfortably, normal vital signs, reassuring work-up, unable to discuss with family but based on reports she is at her cognitive baseline.  Appropriate for discharge.  Patient management required discussion with the following services or consulting groups:  None  Complexity of Problems Addressed Acute illness or injury that poses threat of life of bodily function  Additional Data Reviewed and Analyzed Further history obtained from: Prior labs/imaging results  Additional Factors Impacting ED  Encounter Risk None  Elmer Sow. [name], MD [facility] Emergency [facility] [email or web address]  Final Clinical Impressions(s) / ED Diagnoses     ICD-10-CM   1. Body aches  R52     2. Wound of sacral region, initial encounter  S31.000A       ED Discharge Orders          Ordered    doxycycline (VIBRAMYCIN) 100 MG capsule  2 times daily        12/10/21 0202             Discharge Instructions Discussed with and Provided to Patient:     Discharge Instructions      You were evaluated in the Emergency Department and after careful evaluation, we did not find any emergent  condition requiring admission or further testing in the hospital.  Your exam/testing today is overall reassuring.  Recommend taking the doxycycline antibiotic twice daily for the wound on your bottom.  Please return to the Emergency Department if you experience any worsening of your condition.   Thank you for allowing [place] to be a part of your care.       Maudie Flakes, MD 12/10/21 [phone]

## 2021-12-09 NOTE — ED Triage Notes (Signed)
Pt bib EMS for c/o generalized body aches.

## 2021-12-09 NOTE — ED Notes (Signed)
Pt has small pencil tip sized wound on R buttock that does not appear to be draining at this time. Skin surrounding it is purple and indurated as is entire sacral area. No other wounds noted.

## 2021-12-10 DIAGNOSIS — S31000A Unspecified open wound of lower back and pelvis without penetration into retroperitoneum, initial encounter: Secondary | ICD-10-CM | POA: Diagnosis not present

## 2021-12-10 LAB — URINALYSIS, ROUTINE W REFLEX MICROSCOPIC
Bacteria, UA: NONE SEEN
Bilirubin Urine: NEGATIVE
Glucose, UA: NEGATIVE mg/dL
Ketones, ur: NEGATIVE mg/dL
Leukocytes,Ua: NEGATIVE
Nitrite: NEGATIVE
Protein, ur: 30 mg/dL — AB
Specific Gravity, Urine: 1.016 (ref 1.005–1.030)
pH: 5 (ref 5.0–8.0)

## 2021-12-10 LAB — CBC
HCT: 30.6 % — ABNORMAL LOW (ref 36.0–46.0)
Hemoglobin: 9.4 g/dL — ABNORMAL LOW (ref 12.0–15.0)
MCH: 29.8 pg (ref 26.0–34.0)
MCHC: 30.7 g/dL (ref 30.0–36.0)
MCV: 97.1 fL (ref 80.0–100.0)
Platelets: 200 10*3/uL (ref 150–400)
RBC: 3.15 MIL/uL — ABNORMAL LOW (ref 3.87–5.11)
RDW: 17.2 % — ABNORMAL HIGH (ref 11.5–15.5)
WBC: 4.4 10*3/uL (ref 4.0–10.5)
nRBC: 0 % (ref 0.0–0.2)

## 2021-12-10 LAB — COMPREHENSIVE METABOLIC PANEL
ALT: 11 U/L (ref 0–44)
AST: 15 U/L (ref 15–41)
Albumin: 2.9 g/dL — ABNORMAL LOW (ref 3.5–5.0)
Alkaline Phosphatase: 69 U/L (ref 38–126)
Anion gap: 5 (ref 5–15)
BUN: 27 mg/dL — ABNORMAL HIGH (ref 8–23)
CO2: 24 mmol/L (ref 22–32)
Calcium: 8.3 mg/dL — ABNORMAL LOW (ref 8.9–10.3)
Chloride: 112 mmol/L — ABNORMAL HIGH (ref 98–111)
Creatinine, Ser: 0.57 mg/dL (ref 0.44–1.00)
GFR, Estimated: >60 mL/min (ref >60)
Glucose, Bld: 98 mg/dL (ref 70–99)
Potassium: 3.9 mmol/L (ref 3.5–5.1)
Sodium: 141 mmol/L (ref 135–145)
Total Bilirubin: 0.3 mg/dL (ref 0.3–1.2)
Total Protein: 5.7 g/dL — ABNORMAL LOW (ref 6.5–8.1)

## 2021-12-10 LAB — CK: Total CK: 17 U/L — ABNORMAL LOW (ref 38–234)

## 2021-12-10 LAB — RESP PANEL BY RT-PCR (FLU A&B, COVID) ARPGX2
Influenza A by PCR: NEGATIVE
Influenza B by PCR: NEGATIVE
SARS Coronavirus 2 by RT PCR: NEGATIVE

## 2021-12-10 MED ORDER — HYDROCODONE-ACETAMINOPHEN 5-325 MG PO TABS: 2.0000 | ORAL_TABLET | ORAL | 0 refills | Status: DC | PRN

## 2021-12-10 MED ORDER — DOXYCYCLINE HYCLATE 100 MG PO CAPS: 100.0000 mg | ORAL_CAPSULE | Freq: Two times a day (BID) | ORAL | 0 refills | Status: AC

## 2021-12-10 MED ORDER — DOXYCYCLINE HYCLATE 100 MG PO TABS
100.0000 mg | ORAL_TABLET | Freq: Once | ORAL | Status: AC
  Administered 2021-12-10: 100 mg via ORAL
  Filled 2021-12-10: qty 1

## 2021-12-10 NOTE — ED Notes (Signed)
Pt is resting comfortably with respirations even and unlabored.

## 2021-12-10 NOTE — ED Notes (Signed)
Patient becoming restless and ready to go home. Family called again, Lorenso Quarry is in route to pick patient up. Patient made aware and verbalized understanding.

## 2021-12-10 NOTE — Discharge Instructions (Addendum)
You were evaluated in the Emergency Department and after careful evaluation, we did not find any emergent condition requiring admission or further testing in the hospital.  Your exam/testing today is overall reassuring.  Recommend taking the doxycycline antibiotic twice daily for the wound on your bottom.  Please return to the Emergency Department if you experience any worsening of your condition.   Thank you for allowing [place] to be a part of your care.

## 2021-12-10 NOTE — ED Notes (Signed)
Pt has been resting waiting for ride.

## 2021-12-10 NOTE — ED Notes (Signed)
No answer at contact numbers on facesheet.

## 2021-12-10 NOTE — ED Notes (Signed)
Velna Hatchet contacted and informed pt is being discharged. Requested they come pick her up from [facility] ED. She agreed.

## 2021-12-10 NOTE — ED Provider Notes (Signed)
Family requesting a short dose of pain medication so that she can sleep better.  It appears that patient was on tramadol before that oxycodone.  We will give a prepack of hydrocodone here to go.  Patient has follow-up with her new pain management person in a week.   Vanetta Mulders, MD 12/10/21 1034

## 2021-12-11 MED FILL — Hydrocodone-Acetaminophen Tab 5-325 MG: ORAL | Qty: 6 | Status: AC

## 2021-12-21 NOTE — Progress Notes (Signed)
Suspicion for viral illness  Sabrina Glass. [name], MD [facility] Emergency [facility] [email or web address]

## 2022-04-17 ENCOUNTER — Encounter (HOSPITAL_COMMUNITY): Payer: Self-pay | Admitting: Emergency Medicine

## 2022-04-17 ENCOUNTER — Other Ambulatory Visit: Payer: Self-pay

## 2022-04-17 ENCOUNTER — Inpatient Hospital Stay (HOSPITAL_COMMUNITY)
Admission: EM | Admit: 2022-04-17 | Discharge: 2022-05-23 | DRG: 394 | Disposition: E | Payer: Medicaid Other | Attending: Internal Medicine | Admitting: Internal Medicine

## 2022-04-17 ENCOUNTER — Emergency Department (HOSPITAL_COMMUNITY): Payer: Medicaid Other

## 2022-04-17 DIAGNOSIS — Z885 Allergy status to narcotic agent status: Secondary | ICD-10-CM

## 2022-04-17 DIAGNOSIS — Z833 Family history of diabetes mellitus: Secondary | ICD-10-CM

## 2022-04-17 DIAGNOSIS — Z880 Allergy status to penicillin: Secondary | ICD-10-CM

## 2022-04-17 DIAGNOSIS — Z79899 Other long term (current) drug therapy: Secondary | ICD-10-CM

## 2022-04-17 DIAGNOSIS — I959 Hypotension, unspecified: Secondary | ICD-10-CM | POA: Diagnosis not present

## 2022-04-17 DIAGNOSIS — G8929 Other chronic pain: Secondary | ICD-10-CM | POA: Diagnosis present

## 2022-04-17 DIAGNOSIS — I2510 Atherosclerotic heart disease of native coronary artery without angina pectoris: Secondary | ICD-10-CM | POA: Diagnosis present

## 2022-04-17 DIAGNOSIS — E861 Hypovolemia: Secondary | ICD-10-CM | POA: Diagnosis present

## 2022-04-17 DIAGNOSIS — Z515 Encounter for palliative care: Secondary | ICD-10-CM

## 2022-04-17 DIAGNOSIS — N179 Acute kidney failure, unspecified: Secondary | ICD-10-CM | POA: Diagnosis not present

## 2022-04-17 DIAGNOSIS — R1084 Generalized abdominal pain: Secondary | ICD-10-CM

## 2022-04-17 DIAGNOSIS — D72819 Decreased white blood cell count, unspecified: Secondary | ICD-10-CM | POA: Diagnosis present

## 2022-04-17 DIAGNOSIS — F419 Anxiety disorder, unspecified: Secondary | ICD-10-CM | POA: Diagnosis not present

## 2022-04-17 DIAGNOSIS — D649 Anemia, unspecified: Secondary | ICD-10-CM | POA: Diagnosis not present

## 2022-04-17 DIAGNOSIS — G894 Chronic pain syndrome: Secondary | ICD-10-CM | POA: Diagnosis not present

## 2022-04-17 DIAGNOSIS — E872 Acidosis, unspecified: Secondary | ICD-10-CM

## 2022-04-17 DIAGNOSIS — I4891 Unspecified atrial fibrillation: Secondary | ICD-10-CM | POA: Diagnosis not present

## 2022-04-17 DIAGNOSIS — D509 Iron deficiency anemia, unspecified: Secondary | ICD-10-CM | POA: Diagnosis present

## 2022-04-17 DIAGNOSIS — F0394 Unspecified dementia, unspecified severity, with anxiety: Secondary | ICD-10-CM | POA: Diagnosis present

## 2022-04-17 DIAGNOSIS — R0600 Dyspnea, unspecified: Secondary | ICD-10-CM | POA: Diagnosis present

## 2022-04-17 DIAGNOSIS — E538 Deficiency of other specified B group vitamins: Secondary | ICD-10-CM | POA: Diagnosis present

## 2022-04-17 DIAGNOSIS — Z882 Allergy status to sulfonamides status: Secondary | ICD-10-CM

## 2022-04-17 DIAGNOSIS — Z881 Allergy status to other antibiotic agents status: Secondary | ICD-10-CM

## 2022-04-17 DIAGNOSIS — Z96642 Presence of left artificial hip joint: Secondary | ICD-10-CM | POA: Diagnosis present

## 2022-04-17 DIAGNOSIS — D7589 Other specified diseases of blood and blood-forming organs: Secondary | ICD-10-CM | POA: Diagnosis present

## 2022-04-17 DIAGNOSIS — Z66 Do not resuscitate: Secondary | ICD-10-CM | POA: Diagnosis not present

## 2022-04-17 DIAGNOSIS — E876 Hypokalemia: Secondary | ICD-10-CM

## 2022-04-17 DIAGNOSIS — K219 Gastro-esophageal reflux disease without esophagitis: Secondary | ICD-10-CM

## 2022-04-17 DIAGNOSIS — K449 Diaphragmatic hernia without obstruction or gangrene: Secondary | ICD-10-CM | POA: Diagnosis present

## 2022-04-17 DIAGNOSIS — H9190 Unspecified hearing loss, unspecified ear: Secondary | ICD-10-CM | POA: Diagnosis present

## 2022-04-17 DIAGNOSIS — M1990 Unspecified osteoarthritis, unspecified site: Secondary | ICD-10-CM | POA: Diagnosis present

## 2022-04-17 DIAGNOSIS — I10 Essential (primary) hypertension: Secondary | ICD-10-CM

## 2022-04-17 DIAGNOSIS — K55019 Acute (reversible) ischemia of small intestine, extent unspecified: Principal | ICD-10-CM | POA: Diagnosis present

## 2022-04-17 DIAGNOSIS — K567 Ileus, unspecified: Secondary | ICD-10-CM | POA: Diagnosis present

## 2022-04-17 LAB — FERRITIN: Ferritin: 2 ng/mL — ABNORMAL LOW (ref 11–307)

## 2022-04-17 LAB — LACTIC ACID, PLASMA
Lactic Acid, Venous: 1.9 mmol/L (ref 0.5–1.9)
Lactic Acid, Venous: 2.4 mmol/L (ref 0.5–1.9)
Lactic Acid, Venous: 1.5 mmol/L (ref 0.5–1.9)

## 2022-04-17 LAB — CBC WITH DIFFERENTIAL/PLATELET
Abs Immature Granulocytes: 0.01 10*3/uL (ref 0.00–0.07)
Basophils Absolute: 0 10*3/uL (ref 0.0–0.1)
Basophils Relative: 1 %
Eosinophils Absolute: 0 10*3/uL (ref 0.0–0.5)
Eosinophils Relative: 1 %
HCT: 19.1 % — ABNORMAL LOW (ref 36.0–46.0)
Hemoglobin: 4.3 g/dL — CL (ref 12.0–15.0)
Immature Granulocytes: 0 %
Lymphocytes Relative: 23 %
Lymphs Abs: 0.8 10*3/uL (ref 0.7–4.0)
MCH: 16.4 pg — ABNORMAL LOW (ref 26.0–34.0)
MCHC: 22.5 g/dL — ABNORMAL LOW (ref 30.0–36.0)
MCV: 72.9 fL — ABNORMAL LOW (ref 80.0–100.0)
Monocytes Absolute: 0.4 10*3/uL (ref 0.1–1.0)
Monocytes Relative: 11 %
Neutro Abs: 2.4 10*3/uL (ref 1.7–7.7)
Neutrophils Relative %: 64 %
Platelets: 206 10*3/uL (ref 150–400)
RBC: 2.62 MIL/uL — ABNORMAL LOW (ref 3.87–5.11)
RDW: 20.1 % — ABNORMAL HIGH (ref 11.5–15.5)
WBC: 3.7 10*3/uL — ABNORMAL LOW (ref 4.0–10.5)
nRBC: 0 % (ref 0.0–0.2)

## 2022-04-17 LAB — COMPREHENSIVE METABOLIC PANEL
ALT: 6 U/L (ref 0–44)
AST: 8 U/L — ABNORMAL LOW (ref 15–41)
Albumin: 2.9 g/dL — ABNORMAL LOW (ref 3.5–5.0)
Alkaline Phosphatase: 54 U/L (ref 38–126)
Anion gap: 6 (ref 5–15)
BUN: 11 mg/dL (ref 8–23)
CO2: 22 mmol/L (ref 22–32)
Calcium: 7.3 mg/dL — ABNORMAL LOW (ref 8.9–10.3)
Chloride: 109 mmol/L (ref 98–111)
Creatinine, Ser: 0.8 mg/dL (ref 0.44–1.00)
GFR, Estimated: >60 mL/min (ref >60)
Glucose, Bld: 102 mg/dL — ABNORMAL HIGH (ref 70–99)
Potassium: 3.1 mmol/L — ABNORMAL LOW (ref 3.5–5.1)
Sodium: 137 mmol/L (ref 135–145)
Total Bilirubin: 0.4 mg/dL (ref 0.3–1.2)
Total Protein: 5.5 g/dL — ABNORMAL LOW (ref 6.5–8.1)

## 2022-04-17 LAB — IRON AND TIBC
Iron: <5 ug/dL — ABNORMAL LOW (ref 28–170)
Saturation Ratios: 1 % — ABNORMAL LOW (ref 10.4–31.8)
TIBC: 343 ug/dL (ref 250–450)
UIBC: 339 ug/dL

## 2022-04-17 LAB — RETICULOCYTES
Immature Retic Fract: 17.5 % — ABNORMAL HIGH (ref 2.3–15.9)
RBC.: 2.01 MIL/uL — ABNORMAL LOW (ref 3.87–5.11)
Retic Count, Absolute: 25.1 10*3/uL (ref 19.0–186.0)
Retic Ct Pct: 1.3 % (ref 0.4–3.1)

## 2022-04-17 LAB — PROTIME-INR
INR: 1.4 — ABNORMAL HIGH (ref 0.8–1.2)
Prothrombin Time: 16.8 seconds — ABNORMAL HIGH (ref 11.4–15.2)

## 2022-04-17 LAB — LIPASE, BLOOD: Lipase: 40 U/L (ref 11–51)

## 2022-04-17 LAB — VITAMIN B12: Vitamin B-12: 228 pg/mL (ref 180–914)

## 2022-04-17 LAB — FOLATE: Folate: 6.9 ng/mL (ref >5.9)

## 2022-04-17 LAB — PREPARE RBC (CROSSMATCH)

## 2022-04-17 LAB — MAGNESIUM: Magnesium: 2.2 mg/dL (ref 1.7–2.4)

## 2022-04-17 MED ORDER — ONDANSETRON HCL 4 MG/2ML IJ SOLN
4.0000 mg | Freq: Four times a day (QID) | INTRAMUSCULAR | Status: DC | PRN
  Administered 2022-04-19: 4 mg via INTRAVENOUS
  Filled 2022-04-17: qty 2

## 2022-04-17 MED ORDER — ALPRAZOLAM 0.25 MG PO TABS
0.2500 mg | ORAL_TABLET | Freq: Every day | ORAL | Status: DC | PRN
  Administered 2022-04-17 – 2022-04-20 (×2): 0.25 mg via ORAL
  Filled 2022-04-17 (×2): qty 1

## 2022-04-17 MED ORDER — LACTATED RINGERS IV BOLUS
500.0000 mL | Freq: Once | INTRAVENOUS | Status: AC
  Administered 2022-04-17: 500 mL via INTRAVENOUS

## 2022-04-17 MED ORDER — PANTOPRAZOLE SODIUM 40 MG IV SOLR
40.0000 mg | Freq: Two times a day (BID) | INTRAVENOUS | Status: DC
  Administered 2022-04-18 – 2022-04-23 (×11): 40 mg via INTRAVENOUS
  Filled 2022-04-17 (×11): qty 10

## 2022-04-17 MED ORDER — POTASSIUM CHLORIDE 20 MEQ PO PACK
40.0000 meq | PACK | Freq: Once | ORAL | Status: AC
  Administered 2022-04-17: 40 meq via ORAL
  Filled 2022-04-17: qty 2

## 2022-04-17 MED ORDER — PANTOPRAZOLE SODIUM 20 MG PO TBEC: 20.0000 mg | DELAYED_RELEASE_TABLET | Freq: Every day | ORAL | Status: DC

## 2022-04-17 MED ORDER — PANTOPRAZOLE SODIUM 40 MG PO TBEC
40.0000 mg | DELAYED_RELEASE_TABLET | Freq: Every day | ORAL | Status: DC
  Administered 2022-04-17: 40 mg via ORAL
  Filled 2022-04-17: qty 1

## 2022-04-17 MED ORDER — LISINOPRIL 10 MG PO TABS
10.0000 mg | ORAL_TABLET | Freq: Every day | ORAL | Status: DC
  Administered 2022-04-18 – 2022-04-19 (×2): 10 mg via ORAL
  Filled 2022-04-17 (×2): qty 1

## 2022-04-17 MED ORDER — IOHEXOL 300 MG/ML  SOLN
75.0000 mL | Freq: Once | INTRAMUSCULAR | Status: AC | PRN
  Administered 2022-04-17: 75 mL via INTRAVENOUS

## 2022-04-17 MED ORDER — ACETAMINOPHEN 325 MG PO TABS
650.0000 mg | ORAL_TABLET | Freq: Four times a day (QID) | ORAL | Status: DC | PRN
  Administered 2022-04-20: 650 mg via ORAL
  Filled 2022-04-17: qty 2

## 2022-04-17 MED ORDER — ACETAMINOPHEN 325 MG PO TABS
650.0000 mg | ORAL_TABLET | Freq: Once | ORAL | Status: AC
  Administered 2022-04-17: 650 mg via ORAL
  Filled 2022-04-17: qty 2

## 2022-04-17 MED ORDER — ACETAMINOPHEN 650 MG RE SUPP
650.0000 mg | Freq: Four times a day (QID) | RECTAL | Status: DC | PRN
  Administered 2022-04-19: 650 mg via RECTAL
  Filled 2022-04-17: qty 1

## 2022-04-17 MED ORDER — SODIUM CHLORIDE 0.9% IV SOLUTION: Freq: Once | INTRAVENOUS | Status: DC

## 2022-04-17 MED ORDER — GABAPENTIN 300 MG PO CAPS
300.0000 mg | ORAL_CAPSULE | Freq: Three times a day (TID) | ORAL | Status: DC
  Administered 2022-04-17 – 2022-04-21 (×11): 300 mg via ORAL
  Filled 2022-04-17 (×11): qty 1

## 2022-04-17 MED ORDER — ONDANSETRON HCL 4 MG PO TABS: 4.0000 mg | ORAL_TABLET | Freq: Four times a day (QID) | ORAL | Status: DC | PRN

## 2022-04-17 MED ORDER — OXYCODONE HCL 5 MG PO TABS
5.0000 mg | ORAL_TABLET | ORAL | Status: DC | PRN
  Administered 2022-04-18 – 2022-04-21 (×5): 5 mg via ORAL
  Filled 2022-04-17 (×5): qty 1

## 2022-04-17 MED ORDER — FENTANYL CITRATE PF 50 MCG/ML IJ SOSY
25.0000 ug | PREFILLED_SYRINGE | Freq: Once | INTRAMUSCULAR | Status: AC
  Administered 2022-04-17: 25 ug via INTRAVENOUS
  Filled 2022-04-17: qty 1

## 2022-04-17 MED ORDER — CHLORHEXIDINE GLUCONATE CLOTH 2 % EX PADS
6.0000 | MEDICATED_PAD | Freq: Every day | CUTANEOUS | Status: DC
  Administered 2022-04-17 – 2022-04-23 (×5): 6 via TOPICAL

## 2022-04-17 MED ORDER — CALCIUM GLUCONATE-NACL 1-0.675 GM/50ML-% IV SOLN
1.0000 g | Freq: Once | INTRAVENOUS | Status: AC
  Administered 2022-04-17: 1000 mg via INTRAVENOUS
  Filled 2022-04-17: qty 50

## 2022-04-17 NOTE — Assessment & Plan Note (Signed)
Continue gabapentin.

## 2022-04-17 NOTE — Assessment & Plan Note (Signed)
-   40 mEq of potassium given in the ED - Trend in the a.m.

## 2022-04-17 NOTE — ED Notes (Signed)
2nd bolus delayed while in the warmer. Pt has low hbg and is cold.

## 2022-04-17 NOTE — Assessment & Plan Note (Signed)
Continue Protonix °

## 2022-04-17 NOTE — Assessment & Plan Note (Signed)
-   Hemoglobin today 4.3 - Last time hemoglobin was measured it was 9.4 in November 2023 which seems to be her baseline - Rectal exam was heme-negative - Patient does have a history of GERD, continue Protonix - Anemia panel pending - 3 units packed red blood cells to transfuse overnight - Recheck hemoglobin posttransfusion - Continue to monitor

## 2022-04-17 NOTE — Assessment & Plan Note (Addendum)
-   Lactic acid up to 2.4 - Repeat now - Likely related to acute anemia - LR 500 mL bolus given in the ED - Anticipate improvement with transfusions - Continue to monitor

## 2022-04-17 NOTE — H&P (Signed)
History and Physical    Patient: Sabrina Glass DOB: [birth year] DOA: 04/17/2022 DOS: the patient was seen and examined on 04/17/2022 PCP: System, Provider Not In  Patient coming from: Home  Chief Complaint:  Chief Complaint  Patient presents with   Abnormal Lab   HPI: Sabrina Glass is a 89 y.o. female with medical history significant of arthritis, dementia, hypertension, who presents today due to anemia.  It is reported that patient was in her normal state of health and went to a routine physical exam with her healthcare provider.  Lab work showed a drop in hemoglobin and patient was advised to come into the ER.  On arrival, patient had told the ER doctor that she had generalized abdominal pain.  For me, patient reports that the only thing that hurts is her hands from her arthritis.  Caretaker reported to the ER doctor the patient admitted her normal state of health.  Patient is oriented only to self.  She knows she is in the hospital but does not know what hospital.  She thinks the year is 4.  She is not able to provide any history about why she is here.  She is very pleasantly because you "I do not know," when asked any questions other than in this moment.  No family at bedside.  No ACP documents on file.  Patient is full code by default. Review of Systems: unable to review all systems due to the inability of the patient to answer questions. Past Medical History:  Diagnosis Date   Arthritis    Dementia ([place])    Headache    Hypertension    Pneumonia    Past Surgical History:  Procedure Laterality Date   TOTAL HIP ARTHROPLASTY Left 07/05/2019   Procedure: TOTAL HIP ARTHROPLASTY ANTERIOR APPROACH;  Surgeon: Rod Can, MD;  Location: [facility];  Service: Orthopedics;  Laterality: Left;   Social History:  reports that she has never smoked. She has never used smokeless tobacco. She reports that she does not drink alcohol and does not use drugs.  Allergies  Allergen  Reactions   Codeine Nausea And Vomiting, Swelling and Rash    Throat swelling (pt can take percocet)   Penicillins Nausea And Vomiting, Swelling and Rash    Throat swelling   Sulfa Antibiotics Nausea And Vomiting, Swelling and Rash    Throat swelling    Family History  Problem Relation Age of Onset   Diabetes Mellitus II Father     Prior to Admission medications   Medication Sig Start Date End Date Taking? Authorizing Provider  ALPRAZolam Duanne Moron) 0.25 MG tablet Take 0.25 mg by mouth daily as needed for anxiety. 04/16/22  Yes [provider]  cyanocobalamin (VITAMIN B12) 1000 MCG tablet Take 1,000 mcg by mouth daily. 10/05/21  Yes [provider]  gabapentin (NEURONTIN) 300 MG capsule Take 300 mg by mouth 3 (three) times daily. 04/16/22  Yes [provider]  lisinopril (ZESTRIL) 10 MG tablet Take 10 mg by mouth daily. 04/16/22  Yes [provider]  pantoprazole (PROTONIX) 20 MG tablet Take 20 mg by mouth daily. 04/16/22  Yes [provider]  polyethylene glycol (MIRALAX / GLYCOLAX) 17 g packet Take 17 g by mouth daily as needed (constipation).   Yes [provider]  HYDROcodone-acetaminophen (NORCO/VICODIN) 5-325 MG tablet Take 2 tablets by mouth every 4 (four) hours as needed. Patient not taking: Reported on 04/17/2022 12/10/21   Fredia Sorrow, MD  traMADol (ULTRAM) 50 MG tablet Take  50 mg by mouth 2 (two) times daily as needed for pain. Patient not taking: Reported on 04/17/2022 09/22/21   [provider]    Physical Exam: Vitals:   04/17/22 1755 04/17/22 1800 04/17/22 1816 04/17/22 2000  BP: (!) 111/55 (!) 113/51 (!) 115/56 123/69  Pulse: 73 72 72 (!) 56  Resp: 20 (!) 21 (!) 24 19  Temp: 98.3 F (36.8 C)  98.2 F (36.8 C)   TempSrc: Axillary  Oral   SpO2: 93% 91% 100% 100%   1.  General: Patient lying supine in bed,  no acute distress   2. Psychiatric: Eyes remain closed without any eye contact, oriented to self,  mood and behavior normal for situation, pleasant and cooperative with exam   3. Neurologic: Speech and language are normal, face is symmetric, moves all 4 extremities voluntarily, at baseline without acute deficits on limited exam   4. HEENMT:  Head is atraumatic, normocephalic, pupils reactive to light, neck is supple, trachea is midline, mucous membranes are moist   5. Respiratory : Lungs are clear to auscultation bilaterally without wheezing, rhonchi, rales, no cyanosis, no increase in work of breathing or accessory muscle use   6. Cardiovascular : Heart rate normal, rhythm is regular, no murmurs, rubs or gallops, no peripheral edema, peripheral pulses palpated   7. Gastrointestinal:  Abdomen is soft, nondistended, nontender to palpation bowel sounds active, no masses or organomegaly palpated   8. Skin:  Skin is warm, dry and intact without rashes, acute lesions, or ulcers on limited exam   9.Musculoskeletal:  No acute deformities or trauma, no asymmetry in tone, no peripheral edema, peripheral pulses palpated, no tenderness to palpation in the extremities  Data Reviewed: In the ED Temp 98.1-98.3, heart rate 76-87, respiratory rate 16-28, blood pressure 113/53-131/74, satting at 97% No leukocytosis with white blood cell count of 3.7, hemoglobin 4.3, platelets 206 Chemistry shows a hypokalemia at 3.1, albumin 2.9, lipase 54 Lactic acid uptrending from 1.9-2.4 CT abdomen pelvis shows no acute abnormalities EKG shows a heart rate of 75, sinus rhythm, QTc 473 Patient was given calcium, Xanax, fentanyl, gabapentin, LR 500 mL, Protonix, and Potassium in the ED Reported negative heme on rectal exam Admission requested for acute anemia  Assessment and Plan: * Acute anemia - Hemoglobin today 4.3 - Last time hemoglobin was measured it was 9.4 in November 2023 which seems to be her baseline - Rectal exam was heme-negative - Patient does have a history of GERD, continue Protonix -  Anemia panel pending - 3 units packed red blood cells to transfuse overnight - Recheck hemoglobin posttransfusion - Continue to monitor  Hypokalemia - 40 mEq of potassium given in the ED - Trend in the a.m.  Lactic acidosis - Lactic acid up to 2.4 - Repeat now - Likely related to acute anemia - LR 500 mL bolus given in the ED - Anticipate improvement with transfusions - Continue to monitor  Anxiety - Continue Xanax  Chronic pain syndrome - Continue gabapentin  GERD (gastroesophageal reflux disease) - Continue Protonix  HTN (hypertension) - Continue lisinopril      Advance Care Planning:   Code Status: Full Code  Consults: None at this time  Family Communication: No family at bedside  Severity of Illness: The appropriate patient status for this patient is OBSERVATION. Observation status is judged to be reasonable and necessary in order to provide the required intensity of service to ensure the patient's safety. The patient's presenting symptoms, physical exam findings, and  initial radiographic and laboratory data in the context of their medical condition is felt to place them at decreased risk for further clinical deterioration. Furthermore, it is anticipated that the patient will be medically stable for discharge from the hospital within 2 midnights of admission.   Author: Rolla Plate, DO 04/17/2022 9:23 PM  For on call review [email or web address].

## 2022-04-17 NOTE — Assessment & Plan Note (Signed)
Continue lisinopril

## 2022-04-17 NOTE — ED Triage Notes (Signed)
From home bib [facility]. Seen pcp yesterday and called to come to ED for low HBG. A/o to most. Hx of dementia. Has already taken her morning meds. Pt pale in triage.

## 2022-04-17 NOTE — ED Provider Notes (Signed)
[facility] Provider Note   CSN: [id number] Arrival date & time: 04/17/22  1120     History  Chief Complaint  Patient presents with   Abnormal Lab    Sabrina Glass is a 89 y.o. female.  HPI Patient presents for concern of anemia.  Medical history includes anemia, GERD, chronic pain, anxiety, HTN, CAD, dementia.  She arrives via EMS from home.  She had recent outpatient lab work which showed drop in hemoglobin.  On arrival, patient endorses generalized abdominal pain.  History is limited by dementia.  History per great-granddaughter/caregiver: Patient has been in her normal state of health lately.  She had a routine doctor visit yesterday, during which lab work was obtained.  Lab work showed anemia.  Patient was informed advised to come to the ED.  Patient has not had any physical complaints lately.    Home Medications Prior to Admission medications   Medication Sig Start Date End Date Taking? Authorizing Provider  ALPRAZolam Duanne Moron) 0.25 MG tablet Take 0.25 mg by mouth daily as needed for anxiety. 04/16/22  Yes [provider]  cyanocobalamin (VITAMIN B12) 1000 MCG tablet Take 1,000 mcg by mouth daily. 10/05/21  Yes [provider]  gabapentin (NEURONTIN) 300 MG capsule Take 300 mg by mouth 3 (three) times daily. 04/16/22  Yes [provider]  lisinopril (ZESTRIL) 10 MG tablet Take 10 mg by mouth daily. 04/16/22  Yes [provider]  pantoprazole (PROTONIX) 20 MG tablet Take 20 mg by mouth daily. 04/16/22  Yes [provider]  polyethylene glycol (MIRALAX / GLYCOLAX) 17 g packet Take 17 g by mouth daily as needed (constipation).   Yes [provider]  HYDROcodone-acetaminophen (NORCO/VICODIN) 5-325 MG tablet Take 2 tablets by mouth every 4 (four) hours as needed. Patient not taking: Reported on 04/17/2022 12/10/21   Fredia Sorrow, MD  traMADol (ULTRAM) 50 MG tablet Take 50 mg by mouth 2  (two) times daily as needed for pain. Patient not taking: Reported on 04/17/2022 09/22/21   [provider]      Allergies    Codeine, Penicillins, and Sulfa antibiotics    Review of Systems   Review of Systems  Unable to perform ROS: Dementia  Gastrointestinal:  Positive for abdominal pain.    Physical Exam Updated Vital Signs BP 113/65   Pulse 79   Temp 98.3 F (36.8 C) (Oral)   Resp (!) 28   SpO2 97%  Physical Exam Vitals and nursing note reviewed.  Constitutional:      General: She is not in acute distress.    Appearance: Normal appearance. She is well-developed and normal weight. She is not ill-appearing, toxic-appearing or diaphoretic.  HENT:     Head: Normocephalic and atraumatic.     Right Ear: External ear normal.     Left Ear: External ear normal.     Nose: Nose normal.     Mouth/Throat:     Mouth: Mucous membranes are moist.  Eyes:     Extraocular Movements: Extraocular movements intact.     Conjunctiva/sclera: Conjunctivae normal.  Cardiovascular:     Rate and Rhythm: Normal rate and regular rhythm.     Heart sounds: No murmur heard. Pulmonary:     Effort: Pulmonary effort is normal. No respiratory distress.     Breath sounds: Normal breath sounds. No wheezing or rales.  Chest:     Chest wall: No tenderness.  Abdominal:     General: There is  distension.     Palpations: Abdomen is soft.     Tenderness: There is no abdominal tenderness.  Musculoskeletal:        General: No swelling. Normal range of motion.     Cervical back: Normal range of motion and neck supple.     Right lower leg: No edema.     Left lower leg: No edema.  Skin:    General: Skin is warm and dry.     Capillary Refill: Capillary refill takes less than 2 seconds.     Coloration: Skin is not jaundiced or pale.  Neurological:     General: No focal deficit present.     Mental Status: She is alert. She is disoriented.     Cranial Nerves: No cranial nerve deficit.     Sensory: No  sensory deficit.     Motor: No weakness.     Coordination: Coordination normal.  Psychiatric:        Mood and Affect: Mood normal.        Behavior: Behavior normal.        Thought Content: Thought content normal.        Judgment: Judgment normal.     ED Results / Procedures / Treatments   Labs (all labs ordered are listed, but only abnormal results are displayed) Labs Reviewed  COMPREHENSIVE METABOLIC PANEL - Abnormal; Notable for the following components:      Result Value   Potassium 3.1 (*)    Glucose, Bld 102 (*)    Calcium 7.3 (*)    Total Protein 5.5 (*)    Albumin 2.9 (*)    AST 8 (*)    All other components within normal limits  CBC WITH DIFFERENTIAL/PLATELET - Abnormal; Notable for the following components:   WBC 3.7 (*)    RBC 2.62 (*)    Hemoglobin 4.3 (*)    HCT 19.1 (*)    MCV 72.9 (*)    MCH 16.4 (*)    MCHC 22.5 (*)    RDW 20.1 (*)    All other components within normal limits  PROTIME-INR - Abnormal; Notable for the following components:   Prothrombin Time 16.8 (*)    INR 1.4 (*)    All other components within normal limits  LACTIC ACID, PLASMA - Abnormal; Notable for the following components:   Lactic Acid, Venous 2.4 (*)    All other components within normal limits  IRON AND TIBC - Abnormal; Notable for the following components:   Iron <5 (*)    Saturation Ratios 1 (*)    All other components within normal limits  FERRITIN - Abnormal; Notable for the following components:   Ferritin 2 (*)    All other components within normal limits  RETICULOCYTES - Abnormal; Notable for the following components:   RBC. 2.01 (*)    Immature Retic Fract 17.5 (*)    All other components within normal limits  LIPASE, BLOOD  LACTIC ACID, PLASMA  MAGNESIUM  VITAMIN B12  FOLATE  URINALYSIS, ROUTINE W REFLEX MICROSCOPIC  OCCULT BLOOD X 1 CARD TO LAB, STOOL  TYPE AND SCREEN  PREPARE RBC (CROSSMATCH)    EKG EKG Interpretation  Date/Time:  Tuesday April 17 2022  12:03:25 EDT Ventricular Rate:  75 PR Interval:  223 QRS Duration: 87 QT Interval:  423 QTC Calculation: 473 R Axis:   -43 Text Interpretation: Sinus rhythm Prolonged PR interval Left anterior fascicular block Anterior infarct, old Confirmed by Godfrey Pick [phone]) on 04/17/2022  1:24:21 PM  Radiology No results found.  Procedures Procedures    Medications Ordered in ED Medications  0.9 %  sodium chloride infusion (Manually program via Guardrails IV Fluids) (has no administration in time range)  lactated ringers bolus 500 mL (has no administration in time range)  acetaminophen (TYLENOL) tablet 650 mg (has no administration in time range)  ALPRAZolam (XANAX) tablet 0.25 mg (has no administration in time range)  gabapentin (NEURONTIN) capsule 300 mg (has no administration in time range)  pantoprazole (PROTONIX) EC tablet 40 mg (has no administration in time range)  calcium gluconate 1 g/ 50 mL sodium chloride IVPB (has no administration in time range)  fentaNYL (SUBLIMAZE) injection 25 mcg (25 mcg Intravenous Given 04/17/22 1448)  lactated ringers bolus 500 mL (500 mLs Intravenous New Bag/Given 04/17/22 1439)  potassium chloride (KLOR-CON) packet 40 mEq (40 mEq Oral Given 04/17/22 1448)  iohexol (OMNIPAQUE) 300 MG/ML solution 75 mL (75 mLs Intravenous Contrast Given 04/17/22 1623)    ED Course/ Medical Decision Making/ A&P                             Medical Decision Making Amount and/or Complexity of Data Reviewed Labs: ordered. Radiology: ordered.  Risk Prescription drug management.   This patient presents to the ED for concern of low hemoglobin, this involves an extensive number of treatment options, and is a complaint that carries with it a high risk of complications and morbidity.  The differential diagnosis includes blood loss anemia, iron deficiency, bone marrow disease, lab error   Co morbidities that complicate the patient evaluation  anemia, GERD, chronic pain,  anxiety, HTN, CAD, dementia   Additional history obtained:  Additional history obtained from patient's great granddaughter/caregiver External records from outside source obtained and reviewed including EMR   Lab Tests:  I Ordered, and personally interpreted labs.  The pertinent results include: Severe anemia with hemoglobin of 4.3.  Leukopenia is present as well.  She has a new macrocytosis when compared to lab work from 4 months ago.  Platelet count is normal.  Hypokalemia and hypocalcemia are present with otherwise normal electrolytes.  Lactic acid trended from 1.9 to 2.4 prior to fluids and blood.   Imaging Studies ordered:  I ordered imaging studies including CT of abdomen pelvis I independently visualized and interpreted imaging which showed (pending at time of signout) I agree with the radiologist interpretation   Cardiac Monitoring: / EKG:  The patient was maintained on a cardiac monitor.  I personally viewed and interpreted the cardiac monitored which showed an underlying rhythm of: Sinus rhythm  Problem List / ED Course / Critical interventions / Medication management  Patient presents for an incidental finding of acute anemia.  This was from lab work obtained at her PCPs office yesterday.  Great granddaughter/caregiver reports that this was a routine PCP visit.  Patient has reportedly been asymptomatic lately.  At baseline, patient has dementia.  She is ambulatory and able to walk small distances within the home.  Patient does arrive in the ED via EMS and is complaining of abdominal discomfort.  Abdomen is soft and does not appear to have any tenderness.  Laboratory workup was initiated.  Patient is found to have a hemoglobin of 4.3.  Per chart review, patient's previous baseline, as recently as 4 months ago, was in the range of 10.  She does have a new microcytosis.  I obtained consent from her great granddaughter for  blood transfusion.  3 units of PRBCs was ordered.  Further  lab work shows hypokalemia and hypocalcemia.  Replacement electrolytes were ordered.  Kidney function is preserved.  Given her abdominal pain, CT scan of the abdomen pelvis was ordered.  While in the ED, patient did not have any worsening of pain.  She did complain of being cold, despite multiple blankets.  Lactic acid increased from 1.9 to 2.4.  Of note, this was before any administration of blood or fluid.  Plan will be for admission following CT scan.  Patient does state that she would want to be full code.  Care patient was signed out to oncoming ED provider. I ordered medication including fentanyl for analgesia; PRBCs for severe anemia; IV fluids for hydration; home doses of Xanax and gabapentin; Tylenol for headache Reevaluation of the patient after these medicines showed that the patient improved I have reviewed the patients home medicines and have made adjustments as needed   Social Determinants of Health:  Lives at home with family  CRITICAL CARE Performed by: Godfrey Pick   Total critical care time: 35 minutes  Critical care time was exclusive of separately billable procedures and treating other patients.  Critical care was necessary to treat or prevent imminent or life-threatening deterioration.  Critical care was time spent personally by me on the following activities: development of treatment plan with patient and/or surrogate as well as nursing, discussions with consultants, evaluation of patient's response to treatment, examination of patient, obtaining history from patient or surrogate, ordering and performing treatments and interventions, ordering and review of laboratory studies, ordering and review of radiographic studies, pulse oximetry and re-evaluation of patient's condition.          Final Clinical Impression(s) / ED Diagnoses Final diagnoses:  Low hemoglobin  Hypokalemia  Generalized abdominal pain    Rx / DC Orders ED Discharge Orders     None          Godfrey Pick, MD 04/17/22 [phone]

## 2022-04-17 NOTE — Assessment & Plan Note (Signed)
-   Continue Xanax 

## 2022-04-18 DIAGNOSIS — I10 Essential (primary) hypertension: Secondary | ICD-10-CM | POA: Diagnosis present

## 2022-04-18 DIAGNOSIS — E861 Hypovolemia: Secondary | ICD-10-CM | POA: Diagnosis present

## 2022-04-18 DIAGNOSIS — Z515 Encounter for palliative care: Secondary | ICD-10-CM | POA: Diagnosis not present

## 2022-04-18 DIAGNOSIS — M1990 Unspecified osteoarthritis, unspecified site: Secondary | ICD-10-CM | POA: Diagnosis present

## 2022-04-18 DIAGNOSIS — K449 Diaphragmatic hernia without obstruction or gangrene: Secondary | ICD-10-CM | POA: Diagnosis present

## 2022-04-18 DIAGNOSIS — D72819 Decreased white blood cell count, unspecified: Secondary | ICD-10-CM | POA: Diagnosis present

## 2022-04-18 DIAGNOSIS — D509 Iron deficiency anemia, unspecified: Secondary | ICD-10-CM | POA: Diagnosis present

## 2022-04-18 DIAGNOSIS — R933 Abnormal findings on diagnostic imaging of other parts of digestive tract: Secondary | ICD-10-CM | POA: Diagnosis not present

## 2022-04-18 DIAGNOSIS — I959 Hypotension, unspecified: Secondary | ICD-10-CM | POA: Diagnosis not present

## 2022-04-18 DIAGNOSIS — D649 Anemia, unspecified: Secondary | ICD-10-CM | POA: Diagnosis present

## 2022-04-18 DIAGNOSIS — K567 Ileus, unspecified: Secondary | ICD-10-CM | POA: Diagnosis present

## 2022-04-18 DIAGNOSIS — F0394 Unspecified dementia, unspecified severity, with anxiety: Secondary | ICD-10-CM | POA: Diagnosis present

## 2022-04-18 DIAGNOSIS — E538 Deficiency of other specified B group vitamins: Secondary | ICD-10-CM | POA: Diagnosis present

## 2022-04-18 DIAGNOSIS — G894 Chronic pain syndrome: Secondary | ICD-10-CM | POA: Diagnosis present

## 2022-04-18 DIAGNOSIS — I4891 Unspecified atrial fibrillation: Secondary | ICD-10-CM | POA: Diagnosis not present

## 2022-04-18 DIAGNOSIS — Z833 Family history of diabetes mellitus: Secondary | ICD-10-CM | POA: Diagnosis not present

## 2022-04-18 DIAGNOSIS — N179 Acute kidney failure, unspecified: Secondary | ICD-10-CM | POA: Diagnosis not present

## 2022-04-18 DIAGNOSIS — E872 Acidosis, unspecified: Secondary | ICD-10-CM | POA: Diagnosis present

## 2022-04-18 DIAGNOSIS — Z66 Do not resuscitate: Secondary | ICD-10-CM | POA: Diagnosis not present

## 2022-04-18 DIAGNOSIS — I2510 Atherosclerotic heart disease of native coronary artery without angina pectoris: Secondary | ICD-10-CM | POA: Diagnosis present

## 2022-04-18 DIAGNOSIS — Z96642 Presence of left artificial hip joint: Secondary | ICD-10-CM | POA: Diagnosis present

## 2022-04-18 DIAGNOSIS — K55019 Acute (reversible) ischemia of small intestine, extent unspecified: Secondary | ICD-10-CM | POA: Diagnosis present

## 2022-04-18 DIAGNOSIS — E876 Hypokalemia: Secondary | ICD-10-CM | POA: Diagnosis present

## 2022-04-18 DIAGNOSIS — K219 Gastro-esophageal reflux disease without esophagitis: Secondary | ICD-10-CM | POA: Diagnosis present

## 2022-04-18 DIAGNOSIS — R1084 Generalized abdominal pain: Secondary | ICD-10-CM | POA: Diagnosis not present

## 2022-04-18 DIAGNOSIS — Z79899 Other long term (current) drug therapy: Secondary | ICD-10-CM | POA: Diagnosis not present

## 2022-04-18 LAB — COMPREHENSIVE METABOLIC PANEL
ALT: 6 U/L (ref 0–44)
AST: 7 U/L — ABNORMAL LOW (ref 15–41)
Albumin: 2.7 g/dL — ABNORMAL LOW (ref 3.5–5.0)
Alkaline Phosphatase: 51 U/L (ref 38–126)
Anion gap: 5 (ref 5–15)
BUN: 9 mg/dL (ref 8–23)
CO2: 22 mmol/L (ref 22–32)
Calcium: 7.5 mg/dL — ABNORMAL LOW (ref 8.9–10.3)
Chloride: 111 mmol/L (ref 98–111)
Creatinine, Ser: 0.7 mg/dL (ref 0.44–1.00)
GFR, Estimated: >60 mL/min (ref >60)
Glucose, Bld: 75 mg/dL (ref 70–99)
Potassium: 3.6 mmol/L (ref 3.5–5.1)
Sodium: 138 mmol/L (ref 135–145)
Total Bilirubin: 1.2 mg/dL (ref 0.3–1.2)
Total Protein: 5.2 g/dL — ABNORMAL LOW (ref 6.5–8.1)

## 2022-04-18 LAB — RETICULOCYTES
Immature Retic Fract: 9.7 % (ref 2.3–15.9)
RBC.: 3.75 MIL/uL — ABNORMAL LOW (ref 3.87–5.11)
Retic Count, Absolute: 51 10*3/uL (ref 19.0–186.0)
Retic Ct Pct: 1.4 % (ref 0.4–3.1)

## 2022-04-18 LAB — MAGNESIUM: Magnesium: 2 mg/dL (ref 1.7–2.4)

## 2022-04-18 LAB — URINALYSIS, ROUTINE W REFLEX MICROSCOPIC
Bilirubin Urine: NEGATIVE
Glucose, UA: NEGATIVE mg/dL
Hgb urine dipstick: NEGATIVE
Ketones, ur: NEGATIVE mg/dL
Leukocytes,Ua: NEGATIVE
Nitrite: NEGATIVE
Protein, ur: NEGATIVE mg/dL
Specific Gravity, Urine: 1.023 (ref 1.005–1.030)
pH: 6 (ref 5.0–8.0)

## 2022-04-18 LAB — CBC WITH DIFFERENTIAL/PLATELET
Abs Immature Granulocytes: 0.02 10*3/uL (ref 0.00–0.07)
Basophils Absolute: 0.1 10*3/uL (ref 0.0–0.1)
Basophils Relative: 1 %
Eosinophils Absolute: 0.1 10*3/uL (ref 0.0–0.5)
Eosinophils Relative: 1 %
HCT: 30.6 % — ABNORMAL LOW (ref 36.0–46.0)
Hemoglobin: 8.7 g/dL — ABNORMAL LOW (ref 12.0–15.0)
Immature Granulocytes: 1 %
Lymphocytes Relative: 20 %
Lymphs Abs: 0.8 10*3/uL (ref 0.7–4.0)
MCH: 24.2 pg — ABNORMAL LOW (ref 26.0–34.0)
MCHC: 28.4 g/dL — ABNORMAL LOW (ref 30.0–36.0)
MCV: 85.2 fL (ref 80.0–100.0)
Monocytes Absolute: 0.5 10*3/uL (ref 0.1–1.0)
Monocytes Relative: 13 %
Neutro Abs: 2.4 10*3/uL (ref 1.7–7.7)
Neutrophils Relative %: 64 %
Platelets: 143 10*3/uL — ABNORMAL LOW (ref 150–400)
RBC: 3.59 MIL/uL — ABNORMAL LOW (ref 3.87–5.11)
RDW: 22.4 % — ABNORMAL HIGH (ref 11.5–15.5)
WBC: 3.8 10*3/uL — ABNORMAL LOW (ref 4.0–10.5)
nRBC: 0 % (ref 0.0–0.2)

## 2022-04-18 LAB — IRON AND TIBC
Iron: 120 ug/dL (ref 28–170)
Saturation Ratios: 36 % — ABNORMAL HIGH (ref 10.4–31.8)
TIBC: 335 ug/dL (ref 250–450)
UIBC: 215 ug/dL

## 2022-04-18 LAB — HEMOGLOBIN AND HEMATOCRIT, BLOOD
HCT: 35.8 % — ABNORMAL LOW (ref 36.0–46.0)
Hemoglobin: 10.1 g/dL — ABNORMAL LOW (ref 12.0–15.0)

## 2022-04-18 LAB — FOLATE: Folate: 8.6 ng/mL (ref >5.9)

## 2022-04-18 LAB — MRSA NEXT GEN BY PCR, NASAL: MRSA by PCR Next Gen: DETECTED — AB

## 2022-04-18 LAB — VITAMIN B12: Vitamin B-12: 247 pg/mL (ref 180–914)

## 2022-04-18 LAB — FERRITIN: Ferritin: 5 ng/mL — ABNORMAL LOW (ref 11–307)

## 2022-04-18 MED ORDER — VITAMIN B-12 1000 MCG PO TABS
1000.0000 ug | ORAL_TABLET | Freq: Every day | ORAL | Status: DC
  Administered 2022-04-21: 1000 ug via ORAL
  Filled 2022-04-18: qty 1

## 2022-04-18 MED ORDER — SODIUM CHLORIDE 0.9 % IV SOLN
250.0000 mg | Freq: Every day | INTRAVENOUS | Status: AC
  Administered 2022-04-18 – 2022-04-19 (×2): 250 mg via INTRAVENOUS
  Filled 2022-04-18 (×2): qty 20

## 2022-04-18 MED ORDER — FUROSEMIDE 10 MG/ML IJ SOLN
20.0000 mg | Freq: Once | INTRAMUSCULAR | Status: AC
  Administered 2022-04-18: 20 mg via INTRAVENOUS
  Filled 2022-04-18: qty 2

## 2022-04-18 MED ORDER — CYANOCOBALAMIN 1000 MCG/ML IJ SOLN
1000.0000 ug | Freq: Every day | INTRAMUSCULAR | Status: AC
  Administered 2022-04-18 – 2022-04-19 (×2): 1000 ug via INTRAMUSCULAR
  Filled 2022-04-18 (×2): qty 1

## 2022-04-18 NOTE — Progress Notes (Signed)
[facility] PROGRESS NOTE   Sabrina Glass [id number] DOB: 04-Sep-1927 DOA: 03/31/2022  PCP: System, Provider Not In  Brief History/Interval Summary: 89 y.o. female with medical history significant of arthritis, dementia, hypertension, who presented due to anemia.  It is reported that patient was in her normal state of health and went to a routine physical exam with her healthcare provider.  Lab work showed a drop in hemoglobin and patient was advised to come into the ER.     Consultants: None  Procedures: None    Subjective/Interval History: Patient is hard of hearing.  Her great granddaughter is at the bedside.  Patient denies any abdominal pain nausea or vomiting.  She is trying to have her soup.     Assessment/Plan:  Acute on chronic anemia/iron deficiency Patient presented with a hemoglobin of 4.3.  Review of previous records including Care Everywhere shows that patient was hospitalized in September at the [facility] in [place].  At that time she had a hemoglobin of 6.  She was seen by gastroenterology underwent endoscopy which showed hiatal hernia.  Apparently his APC was performed at that time.  Patient's family member denies any use of NSAIDs.  No history of black-colored stool or dark-colored stools or blood in the stool.  No other bleeding episodes noted.  No clear indication to involve gastroenterology at this time.  Can be pursued in the outpatient setting.  CT scan shows hiatal hernia but no other acute findings noted. Patient transfused 3 units of PRBC.  Hemoglobin has come up to 8.  Anemia panel suggest severe iron deficiency.  Does not appear that she was on any iron supplements at home.  She will be given iron infusion.  She will benefit from being discharged on oral iron supplements. Check hemoglobin later today and tomorrow.  If hemoglobin is stable then she can be discharged home with close outpatient follow-up with frequent blood draws  over the next few weeks.  Vitamin B12 deficiency Will give intramuscular injections while she is here.  Outpatient management.  Folic acid level is 8.6.  Hypokalemia Repleted.  Magnesium is 2.0.  Lactic acidosis Improved with hydration.  Dyspnea Mentions that she is slightly short of breath this morning.  She is noted to be saturating 100% on 2 L of oxygen by nasal cannula.  She did get IV fluids and blood transfusions yesterday.  Might be a little up on volume.  Will give her furosemide x 1.  Chronic pain syndrome Continue gabapentin.  History of GERD Continue Protonix  Essential hypertension Continue lisinopril  Anxiety Continue Xanax.  DVT Prophylaxis: SCDs Code Status: Full code Family Communication: Discussed with great-granddaughter Disposition Plan: Hopefully home in the next 24 to 48 hours  Status is: Observation The patient will require care spanning > 2 midnights and should be moved to inpatient because: Hemoglobin monitoring      Medications: Scheduled:  sodium chloride   Intravenous Once   Chlorhexidine Gluconate Cloth  6 each Topical Q0600   cyanocobalamin  1,000 mcg Intramuscular Daily   Followed by   Derrill Memo ON 04/20/2022] vitamin B-12  1,000 mcg Oral Daily   furosemide  20 mg Intravenous Once   gabapentin  300 mg Oral TID   lisinopril  10 mg Oral Daily   pantoprazole (PROTONIX) IV  40 mg Intravenous Q12H   Continuous:  ferric gluconate (FERRLECIT) IVPB     [id number] **OR** acetaminophen, ALPRAZolam, ondansetron **OR** ondansetron (ZOFRAN) IV, oxyCODONE  Antibiotics: Anti-infectives (  From admission, onward)    None       Objective:  Vital Signs  Vitals:   04/18/22 0400 04/18/22 0417 04/18/22 0700 04/18/22 0800  BP: (!) 141/45  (!) 126/41 (!) 132/48  Pulse: (!) 44  (!) 47 (!) 45  Resp: 17  19 18   Temp:  97.6 F (36.4 C)  97.9 F (36.6 C)  TempSrc:  Oral  Axillary  SpO2: 100%  100% 100%  Weight:  42 kg    Height:         Intake/Output Summary (Last 24 hours) at 04/18/2022 0954 Last data filed at 04/18/2022 0428 Gross per 24 hour  Intake 2633.5 ml  Output 200 ml  Net 2433.5 ml   Filed Weights   04/11/2022 2200 04/18/22 0417  Weight: 42.6 kg 42 kg    General appearance: Awake alert.  In no distress Resp: Clear to auscultation bilaterally.  Normal effort Cardio: S1-S2 is normal regular.  No S3-S4.  No rubs murmurs or bruit GI: Abdomen is soft.  Nontender nondistended.  Bowel sounds are present normal.  No masses organomegaly Extremities: No edema.  Moving her extremities Neurologic:   No focal neurological deficits.    Lab Results:  Data Reviewed: I have personally reviewed following labs and reports of the imaging studies  CBC: Recent Labs  Lab 03/29/2022 1231 04/18/22 0507  WBC 3.7* 3.8*  NEUTROABS 2.4 2.4  HGB 4.3* 8.7*  HCT 19.1* 30.6*  MCV 72.9* 85.2  PLT 206 143*    Basic Metabolic Panel: Recent Labs  Lab 04/11/2022 1231 04/18/22 0507  NA 137 138  K 3.1* 3.6  CL 109 111  CO2 22 22  GLUCOSE 102* 75  BUN 11 9  CREATININE 0.80 0.70  CALCIUM 7.3* 7.5*  MG 2.2 2.0    GFR: Estimated Creatinine Clearance: 25.8 mL/min (by C-G formula based on SCr of 0.7 mg/dL).  Liver Function Tests: Recent Labs  Lab 03/23/2022 1231 04/18/22 0507  AST 8* 7*  ALT 6 6  ALKPHOS 54 51  BILITOT 0.4 1.2  PROT 5.5* 5.2*  ALBUMIN 2.9* 2.7*    Recent Labs  Lab 04/11/2022 1231  LIPASE 40    Coagulation Profile: Recent Labs  Lab 04/16/2022 1231  INR 1.4*    Anemia Panel: Recent Labs    03/28/2022 1231 04/18/22 0507  VITAMINB12 228 247  FOLATE 6.9 8.6  FERRITIN 2* 5*  TIBC 343 335  IRON <5* 120  RETICCTPCT 1.3 1.4    Recent Results (from the past 240 hour(s))  MRSA Next Gen by PCR, Nasal     Status: Abnormal   Collection Time: 04/01/2022  9:27 PM   Specimen: Nasal Mucosa; Nasal Swab  Result Value Ref Range Status   MRSA by PCR Next Gen DETECTED (A) NOT DETECTED Final    Comment:  RESULT CALLED TO, READ BACK BY AND VERIFIED WITH: EDGAR TINAJERO @ [id number] ON 04/18/22 C VARNER (NOTE) The GeneXpert MRSA Assay (FDA approved for NASAL specimens only), is one component of a comprehensive MRSA colonization surveillance program. It is not intended to diagnose MRSA infection nor to guide or monitor treatment for MRSA infections. Test performance is not FDA approved in patients less than 27 years old. Performed at [facility], [address] [place], [place] [address]       Radiology Studies: CT ABDOMEN PELVIS W CONTRAST  Result Date: 04/13/2022 CLINICAL DATA:  Acute lower abdominal pain.  Anemia. EXAM: CT ABDOMEN AND PELVIS WITH CONTRAST TECHNIQUE:  Multidetector CT imaging of the abdomen and pelvis was performed using the standard protocol following bolus administration of intravenous contrast. RADIATION DOSE REDUCTION: This exam was performed according to the departmental dose-optimization program which includes automated exposure control, adjustment of the mA and/or kV according to patient size and/or use of iterative reconstruction technique. CONTRAST:  3mL OMNIPAQUE IOHEXOL 300 MG/ML  SOLN COMPARISON:  None Available. FINDINGS: Lower chest: Small left pleural effusion is noted with minimal adjacent subsegmental atelectasis. Hepatobiliary: Status post cholecystectomy. Mild intrahepatic and extrahepatic biliary dilatation is noted most consistent with post cholecystectomy status. Liver is unremarkable. Pancreas: Unremarkable. No pancreatic ductal dilatation or surrounding inflammatory changes. Spleen: Normal in size without focal abnormality. Adrenals/Urinary Tract: Adrenal glands appear normal. Bilateral renal cysts are noted for which no further follow-up is required. No hydronephrosis or renal obstruction is noted. Urinary bladder is unremarkable. Stomach/Bowel: Large sliding-type hiatal hernia is noted. There is no evidence of bowel obstruction or inflammation. Vascular/Lymphatic:  Aortic atherosclerosis. No enlarged abdominal or pelvic lymph nodes. Reproductive: Uterus is not well visualized reflecting either prior hysterectomy or severe atrophy. No adnexal abnormality is noted. Other: No abdominal wall hernia or abnormality. No abdominopelvic ascites. Musculoskeletal: Status post left total hip arthroplasty. Old T12 fracture is noted. No acute osseous abnormality is noted. IMPRESSION: Small left pleural effusion with minimal adjacent subsegmental atelectasis. Large sliding-type hiatal hernia. No acute abnormality seen in the abdomen or pelvis. Aortic Atherosclerosis (ICD10-I70.0). Electronically Signed   By: Marijo Conception M.D.   On: 04/18/2022 17:03       LOS: 0 days   [facility] Pager on [email or web address]  04/18/2022, 9:54 AM

## 2022-04-18 NOTE — Progress Notes (Signed)
Pt's HR ranging from 43- 60 bpm all night. Pt just sleeping comfortably without any complains of pain and untoward signs and symptoms.MD informed. Kept monitored. Endorsed to upcoming nurse.

## 2022-04-18 NOTE — TOC Progression Note (Signed)
  Transition of Care [facility]) Screening Note   Patient Details  Name: Sabrina Glass Date of Birth: [birth year]   Transition of [facility] CM/SW Contact:    Shade Flood, LCSW Phone Number: 04/18/2022, 11:13 AM    Transition of Care Department [facility]) has reviewed patient and no TOC needs have been identified at this time. We will continue to monitor patient advancement through interdisciplinary progression rounds. If new patient transition needs arise, please place a TOC consult.

## 2022-04-19 ENCOUNTER — Inpatient Hospital Stay (HOSPITAL_COMMUNITY): Payer: Medicaid Other

## 2022-04-19 DIAGNOSIS — R1084 Generalized abdominal pain: Secondary | ICD-10-CM | POA: Diagnosis not present

## 2022-04-19 DIAGNOSIS — D649 Anemia, unspecified: Secondary | ICD-10-CM | POA: Diagnosis not present

## 2022-04-19 LAB — BPAM RBC
Blood Product Expiration Date: 202404022359
Blood Product Expiration Date: 202404302359
Blood Product Expiration Date: 202404302359
ISSUE DATE / TIME: 202403261749
ISSUE DATE / TIME: 202403262214
ISSUE DATE / TIME: 202403270103
Unit Type and Rh: 5100
Unit Type and Rh: 5100
Unit Type and Rh: 9500

## 2022-04-19 LAB — TYPE AND SCREEN
ABO/RH(D): O POS
Antibody Screen: NEGATIVE
Unit division: 0
Unit division: 0
Unit division: 0

## 2022-04-19 LAB — CBC
HCT: 28.7 % — ABNORMAL LOW (ref 36.0–46.0)
Hemoglobin: 8.5 g/dL — ABNORMAL LOW (ref 12.0–15.0)
MCH: 24.4 pg — ABNORMAL LOW (ref 26.0–34.0)
MCHC: 29.6 g/dL — ABNORMAL LOW (ref 30.0–36.0)
MCV: 82.2 fL (ref 80.0–100.0)
Platelets: 206 10*3/uL (ref 150–400)
RBC: 3.49 MIL/uL — ABNORMAL LOW (ref 3.87–5.11)
RDW: 23 % — ABNORMAL HIGH (ref 11.5–15.5)
WBC: 5.5 10*3/uL (ref 4.0–10.5)
nRBC: 0 % (ref 0.0–0.2)

## 2022-04-19 LAB — HEPATIC FUNCTION PANEL
ALT: 6 U/L (ref 0–44)
AST: 11 U/L — ABNORMAL LOW (ref 15–41)
Albumin: 3.1 g/dL — ABNORMAL LOW (ref 3.5–5.0)
Alkaline Phosphatase: 67 U/L (ref 38–126)
Bilirubin, Direct: 0.1 mg/dL (ref 0.0–0.2)
Indirect Bilirubin: 0.5 mg/dL (ref 0.3–0.9)
Total Bilirubin: 0.6 mg/dL (ref 0.3–1.2)
Total Protein: 6 g/dL — ABNORMAL LOW (ref 6.5–8.1)

## 2022-04-19 LAB — BASIC METABOLIC PANEL
Anion gap: 4 — ABNORMAL LOW (ref 5–15)
BUN: 10 mg/dL (ref 8–23)
CO2: 24 mmol/L (ref 22–32)
Calcium: 7.4 mg/dL — ABNORMAL LOW (ref 8.9–10.3)
Chloride: 107 mmol/L (ref 98–111)
Creatinine, Ser: 0.83 mg/dL (ref 0.44–1.00)
GFR, Estimated: >60 mL/min (ref >60)
Glucose, Bld: 91 mg/dL (ref 70–99)
Potassium: 3.8 mmol/L (ref 3.5–5.1)
Sodium: 135 mmol/L (ref 135–145)

## 2022-04-19 LAB — LACTIC ACID, PLASMA
Lactic Acid, Venous: 3.4 mmol/L (ref 0.5–1.9)
Lactic Acid, Venous: 5 mmol/L (ref 0.5–1.9)

## 2022-04-19 LAB — LIPASE, BLOOD: Lipase: 39 U/L (ref 11–51)

## 2022-04-19 LAB — TROPONIN I (HIGH SENSITIVITY)
Troponin I (High Sensitivity): 6 ng/L (ref <18)
Troponin I (High Sensitivity): 5 ng/L (ref <18)

## 2022-04-19 MED ORDER — MORPHINE SULFATE (PF) 2 MG/ML IV SOLN
2.0000 mg | INTRAVENOUS | Status: DC | PRN
  Administered 2022-04-19 – 2022-04-23 (×3): 2 mg via INTRAVENOUS
  Filled 2022-04-19 (×2): qty 1

## 2022-04-19 MED ORDER — DIPHENHYDRAMINE HCL 50 MG/ML IJ SOLN
12.5000 mg | Freq: Once | INTRAMUSCULAR | Status: AC
  Administered 2022-04-19: 12.5 mg via INTRAVENOUS
  Filled 2022-04-19: qty 1

## 2022-04-19 MED ORDER — SODIUM CHLORIDE 0.9 % IV SOLN: 3.0000 g | Freq: Three times a day (TID) | INTRAVENOUS | Status: DC

## 2022-04-19 MED ORDER — SODIUM CHLORIDE 0.9 % IV BOLUS
500.0000 mL | Freq: Once | INTRAVENOUS | Status: AC
  Administered 2022-04-19: 500 mL via INTRAVENOUS

## 2022-04-19 MED ORDER — METOCLOPRAMIDE HCL 5 MG/ML IJ SOLN
5.0000 mg | Freq: Once | INTRAMUSCULAR | Status: AC
  Administered 2022-04-19: 5 mg via INTRAVENOUS
  Filled 2022-04-19: qty 2

## 2022-04-19 MED ORDER — DIPHENHYDRAMINE HCL 50 MG/ML IJ SOLN: 25.0000 mg | Freq: Once | INTRAMUSCULAR | Status: DC

## 2022-04-19 MED ORDER — PANTOPRAZOLE SODIUM 40 MG PO TBEC: 40.0000 mg | DELAYED_RELEASE_TABLET | Freq: Two times a day (BID) | ORAL | 2 refills | Status: DC

## 2022-04-19 MED ORDER — FERROUS SULFATE 325 (65 FE) MG PO TBEC: 325.0000 mg | DELAYED_RELEASE_TABLET | Freq: Two times a day (BID) | ORAL | 3 refills | Status: DC

## 2022-04-19 MED ORDER — SENNOSIDES-DOCUSATE SODIUM 8.6-50 MG PO TABS
2.0000 | ORAL_TABLET | Freq: Two times a day (BID) | ORAL | Status: DC
  Administered 2022-04-19 – 2022-04-21 (×4): 2 via ORAL
  Filled 2022-04-19 (×4): qty 2

## 2022-04-19 MED ORDER — SIMETHICONE 40 MG/0.6ML PO SUSP
40.0000 mg | Freq: Once | ORAL | Status: DC
  Filled 2022-04-19: qty 0.6

## 2022-04-19 MED ORDER — POLYETHYLENE GLYCOL 3350 17 G PO PACK
17.0000 g | PACK | Freq: Every day | ORAL | Status: DC
  Administered 2022-04-21: 17 g via ORAL
  Filled 2022-04-19: qty 1

## 2022-04-19 MED ORDER — IOHEXOL 300 MG/ML  SOLN
75.0000 mL | Freq: Once | INTRAMUSCULAR | Status: AC | PRN
  Administered 2022-04-19: 75 mL via INTRAVENOUS

## 2022-04-19 MED ORDER — MORPHINE SULFATE (PF) 2 MG/ML IV SOLN
INTRAVENOUS | Status: AC
  Filled 2022-04-19: qty 1

## 2022-04-19 MED ORDER — SIMETHICONE 40 MG/0.6ML PO SUSP
40.0000 mg | Freq: Once | ORAL | Status: AC
  Administered 2022-04-19: 40 mg via ORAL
  Filled 2022-04-19: qty 30

## 2022-04-19 MED ORDER — HALOPERIDOL LACTATE 5 MG/ML IJ SOLN
2.0000 mg | Freq: Once | INTRAMUSCULAR | Status: AC | PRN
  Administered 2022-04-20: 2 mg via INTRAVENOUS
  Filled 2022-04-19: qty 1

## 2022-04-19 MED ORDER — VITAMIN B-12 1000 MCG PO TABS: 1000.0000 ug | ORAL_TABLET | Freq: Every day | ORAL | 3 refills | Status: DC

## 2022-04-19 NOTE — Progress Notes (Addendum)
[facility] PROGRESS NOTE   Sabrina Glass [id number] DOB: [birth year] DOA: 04/13/2022  PCP: System, Provider Not In  Brief History/Interval Summary: 89 y.o. female with medical history significant of arthritis, dementia, hypertension, who presented due to anemia.  It is reported that patient was in her normal state of health and went to a routine physical exam with her healthcare provider.  Lab work showed a drop in hemoglobin and patient was advised to come into the ER.     Consultants: None  Procedures: None    Subjective/Interval History: Plan was for discharge today after iron infusion.  However patient developed severe abdominal pain this afternoon.  Initially appeared to be chest pain but patient pointed to her abdominal area.  She felt nauseated.  Discharge has been canceled.     Assessment/Plan:  Abdominal pain with nausea and vomiting EKG does not show any ischemic changes. Chest x-ray does show increased gaseous distention of the hiatal hernia.  No obstruction noted on abdominal film per radiology. Gaseous distention of the hiatal hernia is likely responsible for her symptoms.  She is already on PPI.  Will give her Reglan.  Will like to give her Gas-X/Maalox after a few minutes once her nausea subsided. Patient's symptoms are better after she was given morphine and Zofran.  Will keep her n.p.o. for now. Recently done CT of the abdomen pelvis also reviewed. Follow-up on troponin levels and hepatic function test and lipase level.  Patient noted to have low blood pressure after she was given a second dose of morphine.  Will give fluid bolus.  Discussed with her daughter Adrijana Cleere.  Apprised her of her current clinical status.  CODE STATUS discussed with Holli Humbles and she cannot take a decision at this time. Patient was re-evaluated. Awake and alert but still in lot of pain. Will proceed with CT CAP.  Acute on chronic anemia/iron deficiency Patient presented  with a hemoglobin of 4.3.  Review of previous records including Care Everywhere shows that patient was hospitalized in September at the [facility] in [place].  At that time she had a hemoglobin of 6.  She was seen by gastroenterology underwent endoscopy which showed hiatal hernia.  Apparently his APC was performed at that time.  Patient's family member denies any use of NSAIDs.  No history of black-colored stool or dark-colored stools or blood in the stool.  No other bleeding episodes noted.  No clear indication to involve gastroenterology at this time.  Can be pursued in the outpatient setting.  CT scan shows hiatal hernia but no other acute findings noted. Patient transfused 3 units of PRBC.  Hemoglobin responded appropriately.  Stable this morning. Anemia panel showed severe iron deficiency.  She was given iron infusions.  Will benefit from iron supplementation at discharge.    Vitamin B12 deficiency Will give intramuscular injections while she is here.  Outpatient management.  Folic acid level is 8.6.  Hypokalemia Repleted.  Magnesium is 2.0.  Lactic acidosis Improved with hydration.  Dyspnea She was dyspneic yesterday and was given furosemide x 1 as she had received multiple blood transfusions.  Dyspnea seems better this morning.    Chronic pain syndrome Continue gabapentin.  History of GERD Continue Protonix  Essential hypertension Continue lisinopril  Anxiety Continue Xanax.  DVT Prophylaxis: SCDs Code Status: Full code Family Communication: Discussed with great-granddaughter Disposition Plan: Hopefully home in 24 to 48 hours.     Medications: Scheduled:  sodium chloride   Intravenous  Once   Chlorhexidine Gluconate Cloth  6 each Topical Q0600   gabapentin  300 mg Oral TID   lisinopril  10 mg Oral Daily   metoCLOPramide (REGLAN) injection  5 mg Intravenous Once   pantoprazole (PROTONIX) IV  40 mg Intravenous Q12H   [START ON 04/20/2022] vitamin  B-12  1,000 mcg Oral Daily   Continuous:   [id number] **OR** acetaminophen, ALPRAZolam, morphine injection, ondansetron **OR** ondansetron (ZOFRAN) IV, oxyCODONE  Antibiotics: Anti-infectives (From admission, onward)    None       Objective:  Vital Signs  Vitals:   04/19/22 0919 04/19/22 1442 04/19/22 1500 04/19/22 1529  BP: 116/67 (!) 147/112 139/72 107/76  Pulse: 69 92 97 94  Resp: 16 20 20 16   Temp: 98.6 F (37 C) 98.4 F (36.9 C) 98.3 F (36.8 C)   TempSrc: Oral Oral Oral   SpO2: 96% 95% 92% 91%  Weight:      Height:        Intake/Output Summary (Last 24 hours) at 04/19/2022 1540 Last data filed at 04/19/2022 1500 Gross per 24 hour  Intake 1228.7 ml  Output 550 ml  Net 678.7 ml    Filed Weights   03/24/2022 2200 04/18/22 0417  Weight: 42.6 kg 42 kg    General appearance: Awake alert.  In no distress.  In some discomfort prior to being given analgesic agents. Resp: Clear to auscultation bilaterally.  Normal effort Cardio: S1-S2 is normal regular.  No S3-S4.  No rubs murmurs or bruit GI: Abdomen is soft.  Tender in the epigastric area with some guarding.  No rebound rigidity.  No masses organomegaly. Extremities: No edema.     Lab Results:  Data Reviewed: I have personally reviewed following labs and reports of the imaging studies  CBC: Recent Labs  Lab 03/23/2022 1231 04/18/22 0507 04/18/22 1337 04/19/22 0627  WBC 3.7* 3.8*  --  5.5  NEUTROABS 2.4 2.4  --   --   HGB 4.3* 8.7* 10.1* 8.5*  HCT 19.1* 30.6* 35.8* 28.7*  MCV 72.9* 85.2  --  82.2  PLT 206 143*  --  206     Basic Metabolic Panel: Recent Labs  Lab 04/11/2022 1231 04/18/22 0507 04/19/22 0627  NA 137 138 135  K 3.1* 3.6 3.8  CL 109 111 107  CO2 22 22 24   GLUCOSE 102* 75 91  BUN 11 9 10   CREATININE 0.80 0.70 0.83  CALCIUM 7.3* 7.5* 7.4*  MG 2.2 2.0  --      GFR: Estimated Creatinine Clearance: 24.9 mL/min (by C-G formula based on SCr of 0.83 mg/dL).  Liver Function  Tests: Recent Labs  Lab 03/30/2022 1231 04/18/22 0507  AST 8* 7*  ALT 6 6  ALKPHOS 54 51  BILITOT 0.4 1.2  PROT 5.5* 5.2*  ALBUMIN 2.9* 2.7*     Recent Labs  Lab 04/22/2022 1231  LIPASE 40     Coagulation Profile: Recent Labs  Lab 04/07/2022 1231  INR 1.4*     Anemia Panel: Recent Labs    04/06/2022 1231 04/18/22 0507  VITAMINB12 228 247  FOLATE 6.9 8.6  FERRITIN 2* 5*  TIBC 343 335  IRON <5* 120  RETICCTPCT 1.3 1.4     Recent Results (from the past 240 hour(s))  MRSA Next Gen by PCR, Nasal     Status: Abnormal   Collection Time: 03/28/2022  9:27 PM   Specimen: Nasal Mucosa; Nasal Swab  Result Value Ref Range Status   MRSA  by PCR Next Gen DETECTED (A) NOT DETECTED Final    Comment: RESULT CALLED TO, READ BACK BY AND VERIFIED WITH: EDGAR TINAJERO @ [id number] ON 04/18/22 C VARNER (NOTE) The GeneXpert MRSA Assay (FDA approved for NASAL specimens only), is one component of a comprehensive MRSA colonization surveillance program. It is not intended to diagnose MRSA infection nor to guide or monitor treatment for MRSA infections. Test performance is not FDA approved in patients less than 50 years old. Performed at [facility], [address], [place], [place] [address]       Radiology Studies: DG CHEST PORT 1 VIEW  Result Date: 04/19/2022 CLINICAL DATA:  Shortness of breath EXAM: PORTABLE CHEST 1 VIEW COMPARISON:  Portable exam 1517 hours compared to 06/01/2021 and correlated with CT abdomen pelvis 03/24/2022 FINDINGS: Enlargement of cardiac silhouette. Large hiatal hernia with increased gas versus recent CT. Atherosclerotic calcification aorta and proximal great vessels with prominence of RIGHT paratracheal soft tissues unchanged likely related to tortuous innominate artery. LEFT lower lobe atelectasis versus consolidation and small LEFT pleural effusion. Chronic bronchitic and interstitial changes stable. No pneumothorax. Bones demineralized. IMPRESSION: Increased gaseous  distension of large hiatal hernia versus CT exam of 03/24/2022. LEFT lower lobe atelectasis versus consolidation with small LEFT pleural effusion. Chronic bronchitic and chronic interstitial lung disease changes. Aortic Atherosclerosis (ICD10-I70.0). Electronically Signed   By: Lavonia Dana M.D.   On: 04/19/2022 15:34   CT ABDOMEN PELVIS W CONTRAST  Result Date: 03/26/2022 CLINICAL DATA:  Acute lower abdominal pain.  Anemia. EXAM: CT ABDOMEN AND PELVIS WITH CONTRAST TECHNIQUE: Multidetector CT imaging of the abdomen and pelvis was performed using the standard protocol following bolus administration of intravenous contrast. RADIATION DOSE REDUCTION: This exam was performed according to the departmental dose-optimization program which includes automated exposure control, adjustment of the mA and/or kV according to patient size and/or use of iterative reconstruction technique. CONTRAST:  58mL OMNIPAQUE IOHEXOL 300 MG/ML  SOLN COMPARISON:  None Available. FINDINGS: Lower chest: Small left pleural effusion is noted with minimal adjacent subsegmental atelectasis. Hepatobiliary: Status post cholecystectomy. Mild intrahepatic and extrahepatic biliary dilatation is noted most consistent with post cholecystectomy status. Liver is unremarkable. Pancreas: Unremarkable. No pancreatic ductal dilatation or surrounding inflammatory changes. Spleen: Normal in size without focal abnormality. Adrenals/Urinary Tract: Adrenal glands appear normal. Bilateral renal cysts are noted for which no further follow-up is required. No hydronephrosis or renal obstruction is noted. Urinary bladder is unremarkable. Stomach/Bowel: Large sliding-type hiatal hernia is noted. There is no evidence of bowel obstruction or inflammation. Vascular/Lymphatic: Aortic atherosclerosis. No enlarged abdominal or pelvic lymph nodes. Reproductive: Uterus is not well visualized reflecting either prior hysterectomy or severe atrophy. No adnexal abnormality is  noted. Other: No abdominal wall hernia or abnormality. No abdominopelvic ascites. Musculoskeletal: Status post left total hip arthroplasty. Old T12 fracture is noted. No acute osseous abnormality is noted. IMPRESSION: Small left pleural effusion with minimal adjacent subsegmental atelectasis. Large sliding-type hiatal hernia. No acute abnormality seen in the abdomen or pelvis. Aortic Atherosclerosis (ICD10-I70.0). Electronically Signed   By: Marijo Conception M.D.   On: 03/31/2022 17:03       LOS: 1 day   [facility] Pager on [email or web address]  04/19/2022, 3:40 PM

## 2022-04-19 NOTE — Progress Notes (Signed)
HR remained in 60s throughout night and she slept without reports of pain. She has a good appetite, she ate throughout the night ( sandwiches and crackers and black coffee).

## 2022-04-19 NOTE — Progress Notes (Signed)
   04/19/22 2005  Vitals  Temp 97.7 F (36.5 C)  Temp Source Oral  BP 103/63  MAP (mmHg) 75  BP Location Left Arm  BP Method Automatic  Patient Position (if appropriate) Lying  Pulse Rate 91  Pulse Rate Source Monitor  Resp 18  MEWS COLOR  MEWS Score Color Green  Oxygen Therapy  SpO2 99 %  O2 Device Nasal Cannula  O2 Flow Rate (L/min) 2 L/min  MEWS Score  MEWS Temp 0  MEWS Systolic 0  MEWS Pulse 0  MEWS RR 0  MEWS LOC 0  MEWS Score 0

## 2022-04-20 ENCOUNTER — Inpatient Hospital Stay (HOSPITAL_COMMUNITY): Payer: Medicaid Other

## 2022-04-20 DIAGNOSIS — E872 Acidosis, unspecified: Secondary | ICD-10-CM | POA: Diagnosis not present

## 2022-04-20 DIAGNOSIS — R933 Abnormal findings on diagnostic imaging of other parts of digestive tract: Secondary | ICD-10-CM | POA: Diagnosis not present

## 2022-04-20 DIAGNOSIS — R1084 Generalized abdominal pain: Secondary | ICD-10-CM | POA: Diagnosis not present

## 2022-04-20 DIAGNOSIS — D649 Anemia, unspecified: Secondary | ICD-10-CM | POA: Diagnosis not present

## 2022-04-20 DIAGNOSIS — N179 Acute kidney failure, unspecified: Secondary | ICD-10-CM | POA: Diagnosis not present

## 2022-04-20 LAB — BASIC METABOLIC PANEL
Anion gap: 9 (ref 5–15)
BUN: 18 mg/dL (ref 8–23)
CO2: 19 mmol/L — ABNORMAL LOW (ref 22–32)
Calcium: 7.4 mg/dL — ABNORMAL LOW (ref 8.9–10.3)
Chloride: 110 mmol/L (ref 98–111)
Creatinine, Ser: 1.35 mg/dL — ABNORMAL HIGH (ref 0.44–1.00)
GFR, Estimated: 37 mL/min — ABNORMAL LOW (ref >60)
Glucose, Bld: 86 mg/dL (ref 70–99)
Potassium: 3.5 mmol/L (ref 3.5–5.1)
Sodium: 138 mmol/L (ref 135–145)

## 2022-04-20 LAB — COMPREHENSIVE METABOLIC PANEL
ALT: 8 U/L (ref 0–44)
AST: 17 U/L (ref 15–41)
Albumin: 1.9 g/dL — ABNORMAL LOW (ref 3.5–5.0)
Alkaline Phosphatase: 38 U/L (ref 38–126)
Anion gap: 6 (ref 5–15)
BUN: 15 mg/dL (ref 8–23)
CO2: 17 mmol/L — ABNORMAL LOW (ref 22–32)
Calcium: 5.7 mg/dL — CL (ref 8.9–10.3)
Chloride: 116 mmol/L — ABNORMAL HIGH (ref 98–111)
Creatinine, Ser: 1.07 mg/dL — ABNORMAL HIGH (ref 0.44–1.00)
GFR, Estimated: 48 mL/min — ABNORMAL LOW (ref >60)
Glucose, Bld: 85 mg/dL (ref 70–99)
Potassium: 2.7 mmol/L — CL (ref 3.5–5.1)
Sodium: 139 mmol/L (ref 135–145)
Total Bilirubin: 0.6 mg/dL (ref 0.3–1.2)
Total Protein: 3.7 g/dL — ABNORMAL LOW (ref 6.5–8.1)

## 2022-04-20 LAB — CBC
HCT: 28.6 % — ABNORMAL LOW (ref 36.0–46.0)
HCT: 35.6 % — ABNORMAL LOW (ref 36.0–46.0)
Hemoglobin: 7.9 g/dL — ABNORMAL LOW (ref 12.0–15.0)
Hemoglobin: 9.8 g/dL — ABNORMAL LOW (ref 12.0–15.0)
MCH: 23.9 pg — ABNORMAL LOW (ref 26.0–34.0)
MCH: 24.1 pg — ABNORMAL LOW (ref 26.0–34.0)
MCHC: 27.6 g/dL — ABNORMAL LOW (ref 30.0–36.0)
MCHC: 27.5 g/dL — ABNORMAL LOW (ref 30.0–36.0)
MCV: 86.4 fL (ref 80.0–100.0)
MCV: 87.5 fL (ref 80.0–100.0)
Platelets: 170 10*3/uL (ref 150–400)
Platelets: 205 10*3/uL (ref 150–400)
RBC: 3.31 MIL/uL — ABNORMAL LOW (ref 3.87–5.11)
RBC: 4.07 MIL/uL (ref 3.87–5.11)
RDW: 23.9 % — ABNORMAL HIGH (ref 11.5–15.5)
RDW: 24.6 % — ABNORMAL HIGH (ref 11.5–15.5)
WBC: 11.1 10*3/uL — ABNORMAL HIGH (ref 4.0–10.5)
WBC: 13.7 10*3/uL — ABNORMAL HIGH (ref 4.0–10.5)
nRBC: 0.6 % — ABNORMAL HIGH (ref 0.0–0.2)
nRBC: 0.4 % — ABNORMAL HIGH (ref 0.0–0.2)

## 2022-04-20 LAB — BLOOD CULTURE ID PANEL (REFLEXED) - BCID2

## 2022-04-20 LAB — HEMOGLOBIN AND HEMATOCRIT, BLOOD
HCT: 38.3 % (ref 36.0–46.0)
Hemoglobin: 10.6 g/dL — ABNORMAL LOW (ref 12.0–15.0)

## 2022-04-20 LAB — PROCALCITONIN: Procalcitonin: <0.1 ng/mL

## 2022-04-20 LAB — LACTIC ACID, PLASMA
Lactic Acid, Venous: 5.2 mmol/L (ref 0.5–1.9)
Lactic Acid, Venous: 3.4 mmol/L (ref 0.5–1.9)

## 2022-04-20 MED ORDER — POTASSIUM CHLORIDE 10 MEQ/100ML IV SOLN: 10.0000 meq | INTRAVENOUS | Status: DC

## 2022-04-20 MED ORDER — METRONIDAZOLE 500 MG/100ML IV SOLN
500.0000 mg | Freq: Three times a day (TID) | INTRAVENOUS | Status: DC
  Administered 2022-04-20 – 2022-04-23 (×11): 500 mg via INTRAVENOUS
  Filled 2022-04-20 (×11): qty 100

## 2022-04-20 MED ORDER — SODIUM CHLORIDE 0.9 % IV BOLUS
250.0000 mL | Freq: Once | INTRAVENOUS | Status: AC
  Administered 2022-04-20: 250 mL via INTRAVENOUS

## 2022-04-20 MED ORDER — SODIUM CHLORIDE 0.9 % IV SOLN: INTRAVENOUS | Status: DC

## 2022-04-20 MED ORDER — POTASSIUM CHLORIDE 10 MEQ/100ML IV SOLN
10.0000 meq | INTRAVENOUS | Status: AC
  Administered 2022-04-20 (×2): 10 meq via INTRAVENOUS
  Filled 2022-04-20 (×2): qty 100

## 2022-04-20 MED ORDER — SODIUM CHLORIDE 0.9 % IV BOLUS
500.0000 mL | Freq: Once | INTRAVENOUS | Status: AC
  Administered 2022-04-20: 500 mL via INTRAVENOUS

## 2022-04-20 MED ORDER — SODIUM CHLORIDE 0.9 % IV SOLN
2.0000 g | INTRAVENOUS | Status: DC
  Administered 2022-04-20 – 2022-04-23 (×4): 2 g via INTRAVENOUS
  Filled 2022-04-20 (×4): qty 20

## 2022-04-20 NOTE — Progress Notes (Signed)
Pt had small smear of stool present while doing in and out cath.  Stool placed on hemoccult card, results positive.  Noted occult blood card order from 3/26 after this was tested that had not been sent.  Advised provider that result was positive, however no additional specimen to send to lab.  No new orders received.

## 2022-04-20 NOTE — Progress Notes (Signed)
Patient transferred from 300 to SD earlier in the shift due to vomiting and worsening lactic acidosis. Patient was confused and agitated upon arrival to the unit stating "just leave me alone". NGT placement was ordered. 2mg  Haldol was ordered and given for agitation so the NGT could be placed. 43fr NGT was placed. NGT was hooked to low intermittent suction and about 250cc was suctioned out. Output is green in color. Abdomen is still significantly distended and taut.

## 2022-04-20 NOTE — Progress Notes (Signed)
Provider notified identified organism in aerobic bottle was strep, no resistance.  Pt on rocephin and flagyl.  No new orders at this time.

## 2022-04-20 NOTE — Consult Note (Signed)
Maylon Peppers, M.D. Gastroenterology & Hepatology                                           Patient Name: Sabrina Glass Account #: @FLAACCTNO @   MRN: [id number] Admission Date: 03/26/2022 Date of Evaluation:  04/20/2022 Time of Evaluation: 9:10 AM   Referring Physician: Bonnielee Haff, MD  Chief Complaint: Abdominal pain, large hiatal hernia  HPI:  This is a 89 y.o. female with history of advanced dementia, hypertension, large hiatal hernia, arthritis, who was admitted to the hospital after being sent from her primary care provider due to the presence of severe anemia.  Gastroenterology was consulted after the patient presented severe abdominal pain.  Patient was admitted to the hospital on 03/30/2022 after being sent to the ER for a low hemoglobin level.  In the ER she complained of severe generalized abdominal pain.  She was a poor historian and was not oriented but reported having persistent pain.  Patient reports nursing staff, she presented nausea and vomiting and had a G-tube placed.  No melena or hematochezia.  Yesterday, the patient presented persistent hypotension and was presenting worsening altered mental status, for which she was admitted to stepdown unit.  The patient underwent a CT of the chest, abdomen and pelvis yesterday which showed presence of a large esophageal hiatal hernia containing the majority of the gastric cavity in the thorax which was markedly dilated and with presence of air-fluid levels.  I agree with these findings but also was able to determine the presence of pancreatic atrophy and some mild colonic dilation without transition point, there was presence of stool in the colon.  No family at the bedside today.  Labs today in the morning showed borderline potassium of 3.5 with rest of electrolytes within normal limits, creatinine 1.35, most recent lactic acid was 3.5 and previously was elevated at 5.2.  CBC with WBC 13.7, hemoglobin 9.8 and platelets 205.  Repeat KUB  today showed presence of coiled NG tube at the hiatal hernia.  Patient has presented persistent hypotension with systolic blood pressure fluctuating between 70 and 80s range.  Past Medical History: SEE CHRONIC ISSSUES: Past Medical History:  Diagnosis Date   Arthritis    Dementia ([place])    Headache    Hypertension    Pneumonia    Past Surgical History:  Past Surgical History:  Procedure Laterality Date   TOTAL HIP ARTHROPLASTY Left 07/05/2019   Procedure: TOTAL HIP ARTHROPLASTY ANTERIOR APPROACH;  Surgeon: Rod Can, MD;  Location: [facility];  Service: Orthopedics;  Laterality: Left;   Family History:  Family History  Problem Relation Age of Onset   Diabetes Mellitus II Father    Social History:  Social History   Tobacco Use   Smoking status: Never   Smokeless tobacco: Never  Substance Use Topics   Alcohol use: Never   Drug use: Never    Home Medications:  Prior to Admission medications   Medication Sig Start Date End Date Taking? Authorizing Provider  ALPRAZolam Duanne Moron) 0.25 MG tablet Take 0.25 mg by mouth daily as needed for anxiety. 04/16/22  Yes [provider]  ferrous sulfate 325 (65 FE) MG EC tablet Take 1 tablet (325 mg total) by mouth 2 (two) times daily. 04/19/22 08/17/22 Yes Bonnielee Haff, MD  gabapentin (NEURONTIN) 300 MG capsule Take 300 mg by mouth 3 (three) times daily. 04/16/22  Yes [provider]  lisinopril (ZESTRIL) 10 MG tablet Take 10 mg by mouth daily. 04/16/22  Yes [provider]  polyethylene glycol (MIRALAX / GLYCOLAX) 17 g packet Take 17 g by mouth daily as needed (constipation).   Yes [provider]  cyanocobalamin (VITAMIN B12) 1000 MCG tablet Take 1 tablet (1,000 mcg total) by mouth daily. 04/19/22   Bonnielee Haff, MD  HYDROcodone-acetaminophen (NORCO/VICODIN) 5-325 MG tablet Take 2 tablets by mouth every 4 (four) hours as needed. Patient not taking: Reported on 03/28/2022 12/10/21   Fredia Sorrow, MD   pantoprazole (PROTONIX) 40 MG tablet Take 1 tablet (40 mg total) by mouth 2 (two) times daily. 04/19/22   Bonnielee Haff, MD  traMADol (ULTRAM) 50 MG tablet Take 50 mg by mouth 2 (two) times daily as needed for pain. Patient not taking: Reported on 04/06/2022 09/22/21   [provider]    Inpatient Medications:  Current Facility-Administered Medications:    0.9 %  sodium chloride infusion (Manually program via Guardrails IV Fluids), , Intravenous, Once, Zierle-Ghosh, Asia B, DO   0.9 %  sodium chloride infusion, , Intravenous, Continuous, Bonnielee Haff, MD   acetaminophen (TYLENOL) tablet 650 mg, 650 mg, Oral, Q6H PRN **OR** acetaminophen (TYLENOL) suppository 650 mg, 650 mg, Rectal, Q6H PRN, Zierle-Ghosh, Asia B, DO, 650 mg at 04/19/22 2326   ALPRAZolam (XANAX) tablet 0.25 mg, 0.25 mg, Oral, Daily PRN, Zierle-Ghosh, Asia B, DO, 0.25 mg at 04/18/2022 1647   cefTRIAXone (ROCEPHIN) 2 g in sodium chloride 0.9 % 100 mL IVPB, 2 g, Intravenous, Q24H, Zierle-Ghosh, Asia B, DO, Last Rate: 200 mL/hr at 04/20/22 0724, Infusion Verify at 04/20/22 [id number]   Chlorhexidine Gluconate Cloth 2 % PADS 6 each, 6 each, Topical, Q0600, Zierle-Ghosh, Asia B, DO, 6 each at 04/20/22 0234   [COMPLETED] cyanocobalamin (VITAMIN B12) injection 1,000 mcg, 1,000 mcg, Intramuscular, Daily, 1,000 mcg at 04/19/22 1019 **FOLLOWED BY** cyanocobalamin (VITAMIN B12) tablet 1,000 mcg, 1,000 mcg, Oral, Daily, Bonnielee Haff, MD   gabapentin (NEURONTIN) capsule 300 mg, 300 mg, Oral, TID, Zierle-Ghosh, Asia B, DO, 300 mg at 04/19/22 2304   metroNIDAZOLE (FLAGYL) IVPB 500 mg, 500 mg, Intravenous, Q8H, Zierle-Ghosh, Asia B, DO, Last Rate: 100 mL/hr at 04/20/22 0817, 500 mg at 04/20/22 0817   morphine (PF) 2 MG/ML injection 2 mg, 2 mg, Intravenous, Q2H PRN, Bonnielee Haff, MD, 2 mg at 04/19/22 1751   ondansetron (ZOFRAN) tablet 4 mg, 4 mg, Oral, Q6H PRN **OR** ondansetron (ZOFRAN) injection 4 mg, 4 mg, Intravenous, Q6H PRN,  Zierle-Ghosh, Asia B, DO, 4 mg at 04/19/22 1456   oxyCODONE (Oxy IR/ROXICODONE) immediate release tablet 5 mg, 5 mg, Oral, Q4H PRN, Zierle-Ghosh, Asia B, DO, 5 mg at 04/19/22 1456   pantoprazole (PROTONIX) injection 40 mg, 40 mg, Intravenous, Q12H, Zierle-Ghosh, Asia B, DO, 40 mg at 04/20/22 0818   polyethylene glycol (MIRALAX / GLYCOLAX) packet 17 g, 17 g, Oral, Daily, Bonnielee Haff, MD   potassium chloride 10 mEq in 100 mL IVPB, 10 mEq, Intravenous, Q1 Hr x 2, Zierle-Ghosh, Asia B, DO, Last Rate: 100 mL/hr at 04/20/22 0813, 10 mEq at 04/20/22 0813   senna-docusate (Senokot-S) tablet 2 tablet, 2 tablet, Oral, BID, Bonnielee Haff, MD, 2 tablet at 04/19/22 2304   sodium chloride 0.9 % bolus 500 mL, 500 mL, Intravenous, Once, Bonnielee Haff, MD Allergies: Codeine, Penicillins, and Sulfa antibiotics  Complete Review of Systems: GENERAL: negative for malaise, night sweats HEENT: No changes in hearing or vision, no nose bleeds or  other nasal problems. NECK: Negative for lumps, goiter, pain and significant neck swelling RESPIRATORY: Negative for cough, wheezing CARDIOVASCULAR: Negative for chest pain, leg swelling, palpitations, orthopnea GI: SEE HPI MUSCULOSKELETAL: Negative for joint pain or swelling, back pain, and muscle pain. SKIN: Negative for lesions, rash PSYCH: Negative for sleep disturbance, mood disorder and recent psychosocial stressors. HEMATOLOGY Negative for prolonged bleeding, bruising easily, and swollen nodes. ENDOCRINE: Negative for cold or heat intolerance, polyuria, polydipsia and goiter. NEURO: negative for tremor, gait imbalance, syncope and seizures. The remainder of the review of systems is noncontributory.  Physical Exam: BP (!) 81/64   Pulse 87   Temp 98.8 F (37.1 C) (Oral)   Resp (!) 23   Ht 4\' 7"  (1.397 m)   Wt 42 kg   SpO2 100%   BMI 21.52 kg/m  GENERAL: The patient is somnolent, O x3. Has NGT. HEENT: Head is normocephalic and atraumatic. EOMI are  intact. Mouth is well hydrated and without lesions. NECK: Supple. No masses LUNGS: Clear to auscultation. No presence of rhonchi/wheezing/rales. Adequate chest expansion HEART: mildly tachycardic, normal s1 and s2. ABDOMEN: tender diffusely without no guarding, no peritoneal signs, and nondistended. BS decreased. No masses. EXTREMITIES: Without any cyanosis, clubbing, rash, lesions or edema. NEUROLOGIC: barely arousable, Ox0, no focal motor deficit. SKIN: no jaundice, no rashes  Laboratory Data CBC:     Component Value Date/Time   WBC 13.7 (H) 04/20/2022 0446   RBC 4.07 04/20/2022 0446   HGB 9.8 (L) 04/20/2022 0446   HCT 35.6 (L) 04/20/2022 0446   PLT 205 04/20/2022 0446   MCV 87.5 04/20/2022 0446   MCH 24.1 (L) 04/20/2022 0446   MCHC 27.5 (L) 04/20/2022 0446   RDW 24.6 (H) 04/20/2022 0446   LYMPHSABS 0.8 04/18/2022 0507   MONOABS 0.5 04/18/2022 0507   EOSABS 0.1 04/18/2022 0507   BASOSABS 0.1 04/18/2022 0507   COAG:  Lab Results  Component Value Date   INR 1.4 (H) 04/12/2022    BMP:     Latest Ref Rng & Units 04/20/2022    4:46 AM 04/20/2022    3:13 AM 04/19/2022    6:27 AM  BMP  Glucose 70 - 99 mg/dL 86  85  91   BUN 8 - 23 mg/dL 18  15  10    Creatinine 0.44 - 1.00 mg/dL 1.35  1.07  0.83   Sodium 135 - 145 mmol/L 138  139  135   Potassium 3.5 - 5.1 mmol/L 3.5  2.7  3.8   Chloride 98 - 111 mmol/L 110  116  107   CO2 22 - 32 mmol/L 19  17  24    Calcium 8.9 - 10.3 mg/dL 7.4  5.7  7.4     HEPATIC:     Latest Ref Rng & Units 04/20/2022    3:13 AM 04/19/2022    3:11 PM 04/18/2022    5:07 AM  Hepatic Function  Total Protein 6.5 - 8.1 g/dL 3.7  6.0  5.2   Albumin 3.5 - 5.0 g/dL 1.9  3.1  2.7   AST 15 - 41 U/L 17  11  7    ALT 0 - 44 U/L 8  6  6    Alk Phosphatase 38 - 126 U/L 38  67  51   Total Bilirubin 0.3 - 1.2 mg/dL 0.6  0.6  1.2   Bilirubin, Direct 0.0 - 0.2 mg/dL  0.1      CARDIAC:  Lab Results  Component Value Date  CKTOTAL 17 (L) 12/10/2021      Imaging: I personally reviewed and interpreted the available imaging.  Assessment & Plan: Sabrina Glass is a 89 y.o. female with history of advanced dementia, hypertension, large hiatal hernia, arthritis, who was admitted to the hospital after being sent from her primary care provider due to the presence of severe anemia.  Gastroenterology was consulted after the patient presented severe abdominal pain.  Patient has presented severe abdominal pain and persistent hypotension since her admission.  Cross-sectional abdominal imaging showed presence of large hiatal hernia, no other acute abnormalities were found although there is presence of some colonic dilation.  Upon my personal review of the images, there is possible narrowing of the celiac artery and superior mesenteric artery but this is difficult to evaluate with the recurrent CT protocol.  Her current presentation, clinical findings, elevated lactic acid and hypotension is concerning for mesenteric ischemia, which may be better evaluated with a CT angio.  Nevertheless, as I discussed with the hospitalist, she has advanced dementia and is not clinically stable, for which it is reasonable to hold goals of care discussion given her advanced dementia.  It is also possible that she is presenting some nausea and vomiting due to the hiatal hernia she presents.  This could be further evaluated with an EGD in the future if her clinical status improves but we will hold off on proceeding with this given hypotension.  She may benefit from broad-spectrum antibiotic coverage and PPI twice daily while further discussion was held with family.  Patient has presence of anemia but no presence of overt gastrointestinal bleeding.  Overall, she has a guarded prognosis.  -Goals of care discussion with family -PPI twice daily IV -Continue NG tube drainage intermittently -Consider broad-spectrum coverage with antibiotics. -May start vasopressor support with midodrine  for now -Check H&H every day  Maylon Peppers, MD Gastroenterology and [facility]

## 2022-04-20 NOTE — Progress Notes (Addendum)
[facility] PROGRESS NOTE   Sabrina Glass [id number] DOB: 1927/08/04 DOA: 04/16/2022  PCP: System, Provider Not In  Brief History/Interval Summary: 89 y.o. female with medical history significant of arthritis, dementia, hypertension, who presented due to anemia.  It is reported that patient was in her normal state of health and went to a routine physical exam with her healthcare provider.  Lab work showed a drop in hemoglobin and patient was advised to come into the ER.     Consultants: None  Procedures: None    Subjective/Interval History: Overnight patient was transferred to stepdown unit due to low blood pressures.  Patient is somnolent this morning.  She was given pain medications.  Her great granddaughter is at the bedside.     Assessment/Plan:  Abdominal pain with nausea and vomiting/colonic ileus/hiatal hernia Initially there was concern for cardiac etiology but EKG was unremarkable for ischemia.  Her troponins were negative. Her symptoms appear to be primarily due to GI etiology.  She underwent imaging studies including plain x-rays as well as CT scan of the chest abdomen pelvis overnight.  There is concern for colonic ileus.  She has a large hiatal hernia.  No obvious small bowel obstruction noted.  NGT was placed due to persistent nausea. Symptoms could be due to her significant hiatal hernia. Lactic acidosis was noted raising concern for bowel ischemia.  She is an elderly patient.  She is not a candidate for aggressive interventions. Will request gastroenterology to weigh in.  Unclear if there is a role for upper endoscopy. Continue with PPI. She has been empirically started on ceftriaxone and metronidazole.  Lactic acidosis Continue with IV hydration.  Levels have been improving.  Acute kidney injury Rising creatinine noted. Continue with IV fluids.  Monitor urine output.  Avoid nephrotoxic agents.  Her ACE inhibitor was discontinued.  Hypotension Likely  multifactorial including hypovolemia, possible sepsis.  Continue IV fluids for now.  Acute on chronic anemia/iron deficiency Patient presented with a hemoglobin of 4.3.  Review of previous records including Care Everywhere shows that patient was hospitalized in September at the [facility] in [place].  At that time she had a hemoglobin of 6.  She was seen by gastroenterology underwent endoscopy which showed hiatal hernia.  Apparently APC was performed at that time.  Patient's family member denies any use of NSAIDs.  No history of black-colored stool or dark-colored stools or blood in the stool.  No other bleeding episodes noted.  There was no clear indication to involve gastroenterology.  However now with abdominal symptoms as discussed above.   Patient transfused 3 units of PRBC.  Hemoglobin responded appropriately.  Hemoglobin has been stable.  No overt bleeding noted. Anemia panel showed severe iron deficiency.  She was given iron infusions.  Will benefit from iron supplementation at discharge.    Vitamin B12 deficiency Being supplemented.  Folic acid level is 8.6.  Hypokalemia Repleted.  Magnesium is 2.0.  Dyspnea She was dyspneic on 3/27 and was given furosemide x 1 as she had received multiple blood transfusions.    Chronic pain syndrome Continue gabapentin.  History of GERD Continue Protonix  Essential hypertension Holding lisinopril.  Low blood pressures noted as discussed above.  Anxiety Continue Xanax.  Goals of care Patient appears to have acutely worsened in the last 24 hours.  Etiology is not entirely clear.  Prognosis is guarded.  Will continue to discuss with family.  May need to involve palliative medicine.  ADDENDUM Daughter  was called back.  She discussed with her brother and they have decided to change patient to DNR and to attempt no heroic measures including tubes and lines and invasive procedures.  We will continue current treatment for now.   If she declines we will keep her comfortable.  DVT Prophylaxis: SCDs Code Status: Full code Family Communication: Discussed with great-granddaughter Disposition Plan: To be determined     Medications: Scheduled:  sodium chloride   Intravenous Once   Chlorhexidine Gluconate Cloth  6 each Topical Q0600   vitamin B-12  1,000 mcg Oral Daily   gabapentin  300 mg Oral TID   pantoprazole (PROTONIX) IV  40 mg Intravenous Q12H   polyethylene glycol  17 g Oral Daily   senna-docusate  2 tablet Oral BID   Continuous:  sodium chloride     cefTRIAXone (ROCEPHIN)  IV 200 mL/hr at 04/20/22 0724   metronidazole 500 mg (04/20/22 0817)   potassium chloride 10 mEq (04/20/22 0813)   sodium chloride      [id number] **OR** acetaminophen, ALPRAZolam, morphine injection, ondansetron **OR** ondansetron (ZOFRAN) IV, oxyCODONE  Antibiotics: Anti-infectives (From admission, onward)    Start     Dose/Rate Route Frequency Ordered Stop   04/20/22 0200  metroNIDAZOLE (FLAGYL) IVPB 500 mg        500 mg 100 mL/hr over 60 Minutes Intravenous Every 8 hours 04/20/22 0003     04/20/22 0100  cefTRIAXone (ROCEPHIN) 2 g in sodium chloride 0.9 % 100 mL IVPB        2 g 200 mL/hr over 30 Minutes Intravenous Every 24 hours 04/20/22 0003     04/20/22 0045  Ampicillin-Sulbactam (UNASYN) 3 g in sodium chloride 0.9 % 100 mL IVPB  Status:  Discontinued        3 g 200 mL/hr over 30 Minutes Intravenous Every 8 hours 04/19/22 2351 04/20/22 0003       Objective:  Vital Signs  Vitals:   04/20/22 0600 04/20/22 0700 04/20/22 0800 04/20/22 0812  BP: (!) 94/36   (!) 81/64  Pulse: 81 93 87   Resp: 17 (!) 23 (!) 23   Temp:   98.8 F (37.1 C)   TempSrc:   Oral   SpO2: 100% 100% 100%   Weight:      Height:        Intake/Output Summary (Last 24 hours) at 04/20/2022 0912 Last data filed at 04/20/2022 0724 Gross per 24 hour  Intake 779.61 ml  Output 650 ml  Net 129.61 ml    Filed Weights   04/13/2022 2200  04/18/22 0417  Weight: 42.6 kg 42 kg    General appearance: Somnolent this morning. Resp: Clear to auscultation bilaterally.  Normal effort Cardio: S1-S2 is normal regular.  No S3-S4.  No rubs murmurs or bruit GI: Abdomen is soft.  Tender in the epigastric area without any rebound rigidity or guarding.  No masses organomegaly. Extremities: No edema.   Asleep  Lab Results:  Data Reviewed: I have personally reviewed following labs and reports of the imaging studies  CBC: Recent Labs  Lab 04/12/2022 1231 04/18/22 0507 04/18/22 1337 04/19/22 0627 04/20/22 0032 04/20/22 0313 04/20/22 0446  WBC 3.7* 3.8*  --  5.5  --  11.1* 13.7*  NEUTROABS 2.4 2.4  --   --   --   --   --   HGB 4.3* 8.7* 10.1* 8.5* 10.6* 7.9* 9.8*  HCT 19.1* 30.6* 35.8* 28.7* 38.3 28.6* 35.6*  MCV 72.9* 85.2  --  82.2  --  86.4 87.5  PLT 206 143*  --  206  --  170 205     Basic Metabolic Panel: Recent Labs  Lab 04/06/2022 1231 04/18/22 0507 04/19/22 0627 04/20/22 0313 04/20/22 0446  NA 137 138 135 139 138  K 3.1* 3.6 3.8 2.7* 3.5  CL 109 111 107 116* 110  CO2 22 22 24  17* 19*  GLUCOSE 102* 75 91 85 86  BUN 11 9 10 15 18   CREATININE 0.80 0.70 0.83 1.07* 1.35*  CALCIUM 7.3* 7.5* 7.4* 5.7* 7.4*  MG 2.2 2.0  --   --   --      GFR: Estimated Creatinine Clearance: 15.3 mL/min (A) (by C-G formula based on SCr of 1.35 mg/dL (H)).  Liver Function Tests: Recent Labs  Lab 03/27/2022 1231 04/18/22 0507 04/19/22 1511 04/20/22 0313  AST 8* 7* 11* 17  ALT 6 6 6 8   ALKPHOS 54 51 67 38  BILITOT 0.4 1.2 0.6 0.6  PROT 5.5* 5.2* 6.0* 3.7*  ALBUMIN 2.9* 2.7* 3.1* 1.9*     Recent Labs  Lab 03/28/2022 1231 04/19/22 1511  LIPASE 40 39     Coagulation Profile: Recent Labs  Lab 04/16/2022 1231  INR 1.4*     Anemia Panel: Recent Labs    04/10/2022 1231 04/18/22 0507  VITAMINB12 228 247  FOLATE 6.9 8.6  FERRITIN 2* 5*  TIBC 343 335  IRON <5* 120  RETICCTPCT 1.3 1.4     Recent Results (from the  past 240 hour(s))  MRSA Next Gen by PCR, Nasal     Status: Abnormal   Collection Time: 04/01/2022  9:27 PM   Specimen: Nasal Mucosa; Nasal Swab  Result Value Ref Range Status   MRSA by PCR Next Gen DETECTED (A) NOT DETECTED Final    Comment: RESULT CALLED TO, READ BACK BY AND VERIFIED WITH: EDGAR TINAJERO @ [id number] ON 04/18/22 C VARNER (NOTE) The GeneXpert MRSA Assay (FDA approved for NASAL specimens only), is one component of a comprehensive MRSA colonization surveillance program. It is not intended to diagnose MRSA infection nor to guide or monitor treatment for MRSA infections. Test performance is not FDA approved in patients less than 73 years old. Performed at [facility], [address], [place], [place] [address]   Culture, blood (Routine X 2) w Reflex to ID Panel     Status: None (Preliminary result)   Collection Time: 04/20/22 12:21 AM   Specimen: BLOOD  Result Value Ref Range Status   Specimen Description BLOOD BLOOD LEFT HAND  Final   Special Requests   Final    BOTTLES DRAWN AEROBIC AND ANAEROBIC Blood Culture results may not be optimal due to an inadequate volume of blood received in culture bottles Performed at [facility], [address], [place], [place] [address]    Culture PENDING  Incomplete   Report Status PENDING  Incomplete  Culture, blood (Routine X 2) w Reflex to ID Panel     Status: None (Preliminary result)   Collection Time: 04/20/22 12:32 AM   Specimen: BLOOD  Result Value Ref Range Status   Specimen Description BLOOD BLOOD RIGHT HAND  Final   Special Requests   Final    BOTTLES DRAWN AEROBIC AND ANAEROBIC Blood Culture results may not be optimal due to an inadequate volume of blood received in culture bottles Performed at [facility], [address], [place], [place] [address]    Culture PENDING  Incomplete   Report Status PENDING  Incomplete      Radiology Studies: DG Abd 1 View  Result Date: 04/20/2022 CLINICAL DATA:  Nasogastric tube placement.  EXAM: ABDOMEN - 1 VIEW COMPARISON:  Same day. FINDINGS: Nasogastric tube is again noted to be coiled within the expected position of the hiatal hernia. Moderate amount of stool is noted in the right and transverse colon. No abnormal small bowel dilatation is noted. IMPRESSION: Nasogastric tube tip is noted to be coiled within expected position of hiatal hernia. Electronically Signed   By: Marijo Conception M.D.   On: 04/20/2022 08:36   DG Abd 1 View  Result Date: 04/20/2022 CLINICAL DATA:  Check gastric catheter placement EXAM: ABDOMEN - 1 VIEW COMPARISON:  04/19/2022 FINDINGS: Gastric catheter is noted coiled within a large hiatal hernia. Additionally a loop is noted in the distal esophagus. IMPRESSION: Coiling of the gastric catheter within the hiatal hernia. There is a loop identified in the mid esophagus as well. Slight withdrawal and reimaging by means of chest x-ray may be helpful. Electronically Signed   By: Inez Catalina M.D.   On: 04/20/2022 01:37   CT CHEST ABDOMEN PELVIS W CONTRAST  Result Date: 04/19/2022 CLINICAL DATA:  Severe abdominal pain radiating to the chest. Hiatal hernia. Anemia and dementia. EXAM: CT CHEST, ABDOMEN, AND PELVIS WITH CONTRAST TECHNIQUE: Multidetector CT imaging of the chest, abdomen and pelvis was performed following the standard protocol during bolus administration of intravenous contrast. RADIATION DOSE REDUCTION: This exam was performed according to the departmental dose-optimization program which includes automated exposure control, adjustment of the mA and/or kV according to patient size and/or use of iterative reconstruction technique. CONTRAST:  81mL OMNIPAQUE IOHEXOL 300 MG/ML  SOLN COMPARISON:  CT abdomen and pelvis 04/04/2022 FINDINGS: CT CHEST FINDINGS Cardiovascular: Normal heart size. No pericardial effusions. Normal caliber thoracic aorta. No dissection. Calcification of the aorta and coronary arteries. Mediastinum/Nodes: Large esophageal hiatal hernia with  nearly all of the stomach above the diaphragm. Fluid-filled nondistended esophagus likely due to reflux or dysmotility. No significant lymphadenopathy. Thyroid gland is unremarkable. Lungs/Pleura: Bilateral pleural effusions with basilar atelectasis or consolidation, greater on the left. Emphysematous changes. Motion artifact limits evaluation of the lungs but there appear to be patchy interstitial changes throughout the lungs, possibly indicating edema or pneumonia. No pneumothorax. Musculoskeletal: Increased thoracic kyphosis with multiple thoracic vertebral compression fractures. Lower thoracic compression fractures are not changed since the previous CT abdomen and pelvis. Upper thoracic compression are also likely chronic. Changes may represent osteoporosis. Degenerative changes in the spine and shoulders. CT ABDOMEN PELVIS FINDINGS Hepatobiliary: Surgical absence of the gallbladder. Intra and extrahepatic bile duct dilatation is unchanged since prior study, likely physiologic. Calcification in the liver is likely postinflammatory. Pancreas: Diffuse pancreatic parenchymal atrophy likely representing chronic pancreatitis. No acute inflammatory changes. No focal mass or collection. Spleen: Normal in size without focal abnormality. Adrenals/Urinary Tract: No adrenal gland nodules. Nephrograms are symmetrical but appear somewhat delayed, possibly due to renal insufficiency. No hydronephrosis or hydroureter. Bilateral renal cysts. Largest is on the left measuring 7.5 cm diameter. No change since prior study. No imaging follow-up is indicated. Bladder is partially obscured by streak artifact from a left hip arthroplasty. No abnormalities demonstrated. Stomach/Bowel: Small bowel are not abnormally distended and mostly decompressed. Diffusely dilated colon with prominent stool throughout. Colonic dilatation is progressing since the prior study and probably represents progressive ileus. Colitis less likely. No wall  thickening or focal lesions identified. Appendix is not identified. Vascular/Lymphatic: Calcification of the aorta. No aneurysm.  No significant lymphadenopathy. Reproductive: Status post hysterectomy. No adnexal masses. Other: No free air or free fluid in the abdomen. Diffuse atrophy of the abdominal wall musculature. Musculoskeletal: Lumbar scoliosis convex towards the left. Multiple vertebral compression deformities are unchanged since prior study. Postoperative left hip arthroplasty. IMPRESSION: 1. Large esophageal hiatal hernia containing nearly all of the stomach. Fluid-filled nondilated esophagus likely due to reflux or dysmotility. 2. Emphysematous changes in the lungs with patchy interstitial changes possibly representing mild edema. Bilateral pleural effusions with basilar atelectasis. 3. Bile duct dilatation is likely physiologic post cholecystectomy. 4. Diffuse pancreatic atrophy likely due to chronic pancreatitis. No acute changes identified. 5. Aortic atherosclerosis. 6. Multiple vertebral compression deformities appear chronic and likely indicate osteoporosis. Electronically Signed   By: Lucienne Capers M.D.   On: 04/19/2022 22:15   DG Abd Portable 1V  Result Date: 04/19/2022 CLINICAL DATA:  Shortness of breath, abdominal pain EXAM: PORTABLE ABDOMEN - 1 VIEW COMPARISON:  Portable exam 1520 hours FINDINGS: Portable exam 1520 hours. Mildly increased stool throughout colon to rectum. Mild gaseous distention of stomach. Nondistended small bowel. No evidence of bowel obstruction or bowel wall thickening. Scattered atherosclerotic calcifications aorta and visceral vessels. Osseous demineralization with thoracolumbar scoliosis, LEFT hip prosthesis, and thoracolumbar compression fracture. IMPRESSION: Increased stool in colon. Electronically Signed   By: Lavonia Dana M.D.   On: 04/19/2022 15:41   DG CHEST PORT 1 VIEW  Result Date: 04/19/2022 CLINICAL DATA:  Shortness of breath EXAM: PORTABLE CHEST 1  VIEW COMPARISON:  Portable exam 1517 hours compared to 06/01/2021 and correlated with CT abdomen pelvis 04/05/2022 FINDINGS: Enlargement of cardiac silhouette. Large hiatal hernia with increased gas versus recent CT. Atherosclerotic calcification aorta and proximal great vessels with prominence of RIGHT paratracheal soft tissues unchanged likely related to tortuous innominate artery. LEFT lower lobe atelectasis versus consolidation and small LEFT pleural effusion. Chronic bronchitic and interstitial changes stable. No pneumothorax. Bones demineralized. IMPRESSION: Increased gaseous distension of large hiatal hernia versus CT exam of 03/24/2022. LEFT lower lobe atelectasis versus consolidation with small LEFT pleural effusion. Chronic bronchitic and chronic interstitial lung disease changes. Aortic Atherosclerosis (ICD10-I70.0). Electronically Signed   By: Lavonia Dana M.D.   On: 04/19/2022 15:34       LOS: 2 days   [facility] Pager on [email or web address]  04/20/2022, 9:12 AM

## 2022-04-21 DIAGNOSIS — I480 Paroxysmal atrial fibrillation: Secondary | ICD-10-CM

## 2022-04-21 DIAGNOSIS — R1084 Generalized abdominal pain: Secondary | ICD-10-CM

## 2022-04-21 DIAGNOSIS — K449 Diaphragmatic hernia without obstruction or gangrene: Secondary | ICD-10-CM | POA: Diagnosis not present

## 2022-04-21 DIAGNOSIS — N179 Acute kidney failure, unspecified: Secondary | ICD-10-CM | POA: Diagnosis not present

## 2022-04-21 DIAGNOSIS — E872 Acidosis, unspecified: Secondary | ICD-10-CM | POA: Diagnosis not present

## 2022-04-21 DIAGNOSIS — D649 Anemia, unspecified: Secondary | ICD-10-CM | POA: Diagnosis not present

## 2022-04-21 LAB — BASIC METABOLIC PANEL
Anion gap: 6 (ref 5–15)
BUN: 30 mg/dL — ABNORMAL HIGH (ref 8–23)
CO2: 17 mmol/L — ABNORMAL LOW (ref 22–32)
Calcium: 6.9 mg/dL — ABNORMAL LOW (ref 8.9–10.3)
Chloride: 116 mmol/L — ABNORMAL HIGH (ref 98–111)
Creatinine, Ser: 1.58 mg/dL — ABNORMAL HIGH (ref 0.44–1.00)
GFR, Estimated: 30 mL/min — ABNORMAL LOW (ref >60)
Glucose, Bld: 77 mg/dL (ref 70–99)
Potassium: 4.1 mmol/L (ref 3.5–5.1)
Sodium: 139 mmol/L (ref 135–145)

## 2022-04-21 LAB — HEMOGLOBIN AND HEMATOCRIT, BLOOD
HCT: 30.6 % — ABNORMAL LOW (ref 36.0–46.0)
Hemoglobin: 8.4 g/dL — ABNORMAL LOW (ref 12.0–15.0)

## 2022-04-21 LAB — LACTIC ACID, PLASMA: Lactic Acid, Venous: 1.4 mmol/L (ref 0.5–1.9)

## 2022-04-21 LAB — MAGNESIUM: Magnesium: 1.8 mg/dL (ref 1.7–2.4)

## 2022-04-21 MED ORDER — AMIODARONE HCL IN DEXTROSE 360-4.14 MG/200ML-% IV SOLN
60.0000 mg/h | INTRAVENOUS | Status: AC
  Administered 2022-04-21 (×2): 60 mg/h via INTRAVENOUS
  Filled 2022-04-21 (×2): qty 200

## 2022-04-21 MED ORDER — AMIODARONE HCL IN DEXTROSE 360-4.14 MG/200ML-% IV SOLN
30.0000 mg/h | INTRAVENOUS | Status: DC
  Administered 2022-04-21: 30 mg/h via INTRAVENOUS
  Filled 2022-04-21: qty 200

## 2022-04-21 MED ORDER — MIDODRINE HCL 5 MG PO TABS
5.0000 mg | ORAL_TABLET | Freq: Three times a day (TID) | ORAL | Status: DC
  Administered 2022-04-21 (×2): 5 mg via ORAL
  Filled 2022-04-21 (×2): qty 1

## 2022-04-21 MED ORDER — AMIODARONE IV BOLUS ONLY 150 MG/100ML
150.0000 mg | Freq: Once | INTRAVENOUS | Status: AC
  Administered 2022-04-21: 150 mg via INTRAVENOUS
  Filled 2022-04-21: qty 100

## 2022-04-21 MED ORDER — AMIODARONE IV BOLUS ONLY 150 MG/100ML
INTRAVENOUS | Status: AC
  Administered 2022-04-21: 150 mg via INTRAVENOUS
  Filled 2022-04-21: qty 100

## 2022-04-21 MED ORDER — AMIODARONE IV BOLUS ONLY 150 MG/100ML: 150.0000 mg | Freq: Once | INTRAVENOUS | Status: AC

## 2022-04-21 MED ORDER — SODIUM CHLORIDE 0.9 % IV BOLUS
500.0000 mL | Freq: Once | INTRAVENOUS | Status: AC
  Administered 2022-04-21: 500 mL via INTRAVENOUS

## 2022-04-21 NOTE — Progress Notes (Signed)
Bladder scan done at 0245 showing 140 ml urine.  Pt has had no urine output since I&O cath at 1915.  Pt HR had been increasing during shift, going from SR to ST.  Shortly after scan done, noted that pt HR increasing, HR irreg, rate as high as 180s.  EKG done showing AFIB RVR.  Pt states CP/SHOB.  Bps low.  Provider notified, lab orders received along with 500 ml fluid bolus.  1 hr later, pt continues to show elevated HR with soft Bps.  Order for amiodarone bolus.  Currently HR in 120s but sustained. Pt able to rest at this time, no further c/o pain.  No urine output at this time.  NGT patent with small amount green output.  Plan of care ongoing.

## 2022-04-21 NOTE — Progress Notes (Signed)
Message sent to provider inquiring about amiodarone infusion.  Bolus completed, pt responded with HR in 120s and improved BP.  Pt HR increasing back to 130s-150s, pressure 77/39.  Awaiting further orders.

## 2022-04-21 NOTE — Progress Notes (Signed)
[facility] PROGRESS NOTE   Sabrina Glass [id number] DOB: 01/30/1927 DOA: 04/16/2022  PCP: System, Provider Not In  Brief History/Interval Summary: 89 y.o. female with medical history significant of arthritis, dementia, hypertension, who presented due to anemia.  It is reported that patient was in her normal state of health and went to a routine physical exam with her healthcare provider.  Lab work showed a drop in hemoglobin and patient was advised to come into the ER.     Consultants: None  Procedures: None    Subjective/Interval History: Overnight events noted.  Patient went into atrial fibrillation with RVR.  Patient somnolent easily arousable continues to complain of abdominal pain.     Assessment/Plan:  Abdominal pain with nausea and vomiting/colonic ileus/hiatal hernia Initially there was concern for cardiac etiology but EKG was unremarkable for ischemia.  Her troponins were negative. Her symptoms appear to be primarily due to GI etiology.  She underwent imaging studies including plain x-rays as well as CT scan of the chest abdomen pelvis overnight.  There is concern for colonic ileus.  She has a large hiatal hernia.  No obvious small bowel obstruction noted.  NGT was placed due to persistent nausea. Symptoms could be due to her significant hiatal hernia. Lactic acidosis was noted raising concern for bowel ischemia.   She is an elderly patient with dementia.  She is not a candidate for aggressive interventions. Appreciate gastroenterology input. Worsening renal function noted.  Prognosis is guarded at this time. Continue with ceftriaxone and metronidazole for now. Lactic acid level noted to have improved.  Continue supportive care.  Atrial fibrillation with RVR Likely triggered by acute illness.  Noted to be hypotensive overnight.  Was given amiodarone bolus with transient improvement in heart rate.  Will give another bolus and start her on amiodarone  infusion. Will check TSH.  Consider echocardiogram as well.  Lactic acidosis Continue with IV hydration.  Levels have been improving.  Acute kidney injury Creatinine continues to worsen.  Continue with IV fluids. Prognosis is guarded.  ACE inhibitor was discontinued.    Hypotension Likely multifactorial including hypovolemia, possible sepsis.  Continue IV fluids for now. Can start midodrine.  No pressors.  Acute on chronic anemia/iron deficiency Patient presented with a hemoglobin of 4.3.  Review of previous records including Care Everywhere shows that patient was hospitalized in September at the [facility] in [place].  At that time she had a hemoglobin of 6.  She was seen by gastroenterology underwent endoscopy which showed hiatal hernia.  Apparently APC was performed at that time.  Patient's family member denies any use of NSAIDs.  No history of black-colored stool or dark-colored stools or blood in the stool.  No other bleeding episodes noted.  There was no clear indication to involve gastroenterology.  However now with abdominal symptoms as discussed above.   Patient transfused 3 units of PRBC.  Hemoglobin responded appropriately.  Hemoglobin has been stable.  No overt bleeding noted. Anemia panel showed severe iron deficiency.  She was given iron infusions.  Will benefit from iron supplementation at discharge.    Vitamin B12 deficiency Being supplemented.  Folic acid level is 8.6.  Hypokalemia Repleted.  Magnesium is 2.0.  Dyspnea She was dyspneic on 3/27 and was given furosemide x 1 as she had received multiple blood transfusions.    Chronic pain syndrome Continue gabapentin.  History of GERD Continue Protonix  Essential hypertension Holding lisinopril.  Low blood pressures noted as discussed  above.  Anxiety/history of dementia Continue Xanax.  Goals of care Patient with acute worsening in the last 24 to 48 hours.  Prognosis is guarded.  After  discussions with family yesterday she was changed over to DNR.  No aggressive interventions to be performed.  Mostly supportive care to be given.  If she were to continue to decline then transition to comfort care would be appropriate.    DVT Prophylaxis: SCDs Code Status: Full code Family Communication: No family at bedside today. Disposition Plan: To be determined     Medications: Scheduled:  sodium chloride   Intravenous Once   Chlorhexidine Gluconate Cloth  6 each Topical Q0600   vitamin B-12  1,000 mcg Oral Daily   gabapentin  300 mg Oral TID   pantoprazole (PROTONIX) IV  40 mg Intravenous Q12H   polyethylene glycol  17 g Oral Daily   senna-docusate  2 tablet Oral BID   Continuous:  sodium chloride 100 mL/hr at 04/21/22 0505   amiodarone 60 mg/hr (04/21/22 0809)   amiodarone     cefTRIAXone (ROCEPHIN)  IV Stopped (04/21/22 0050)   metronidazole Stopped (04/21/22 0239)    [id number] **OR** acetaminophen, ALPRAZolam, morphine injection, ondansetron **OR** ondansetron (ZOFRAN) IV, oxyCODONE  Antibiotics: Anti-infectives (From admission, onward)    Start     Dose/Rate Route Frequency Ordered Stop   04/20/22 0200  metroNIDAZOLE (FLAGYL) IVPB 500 mg        500 mg 100 mL/hr over 60 Minutes Intravenous Every 8 hours 04/20/22 0003     04/20/22 0100  cefTRIAXone (ROCEPHIN) 2 g in sodium chloride 0.9 % 100 mL IVPB        2 g 200 mL/hr over 30 Minutes Intravenous Every 24 hours 04/20/22 0003     04/20/22 0045  Ampicillin-Sulbactam (UNASYN) 3 g in sodium chloride 0.9 % 100 mL IVPB  Status:  Discontinued        3 g 200 mL/hr over 30 Minutes Intravenous Every 8 hours 04/19/22 2351 04/20/22 0003       Objective:  Vital Signs  Vitals:   04/21/22 0530 04/21/22 0600 04/21/22 0630 04/21/22 0724  BP:  (!) 77/39    Pulse:   (!) 27   Resp: 16 17 (!) 22   Temp:    98.1 F (36.7 C)  TempSrc:    Oral  SpO2:   90%   Weight:      Height:        Intake/Output Summary  (Last 24 hours) at 04/21/2022 0938 Last data filed at 04/21/2022 0556 Gross per 24 hour  Intake 2884.95 ml  Output 485 ml  Net 2399.95 ml    Filed Weights   04/15/2022 2200 04/18/22 0417 04/21/22 0455  Weight: 42.6 kg 42 kg 47.8 kg    General appearance: Remains normal and but arousable.  Opens her eyes briefly.  Distracted. Resp: Clear to auscultation bilaterally.  Normal effort Cardio: S1-S2 is normal regular.  No S3-S4.  No rubs murmurs or bruit GI: Abdomen is soft.  Distended.  Tender diffusely without any rebound rigidity.  Some guarding is present.  Bowel sounds sluggish but present. Extremities: No edema.    Lab Results:  Data Reviewed: I have personally reviewed following labs and reports of the imaging studies  CBC: Recent Labs  Lab 04/22/2022 1231 04/18/22 0507 04/18/22 1337 04/19/22 0627 04/20/22 0032 04/20/22 0313 04/20/22 0446 04/21/22 0446  WBC 3.7* 3.8*  --  5.5  --  11.1* 13.7*  --  NEUTROABS 2.4 2.4  --   --   --   --   --   --   HGB 4.3* 8.7*   < > 8.5* 10.6* 7.9* 9.8* 8.4*  HCT 19.1* 30.6*   < > 28.7* 38.3 28.6* 35.6* 30.6*  MCV 72.9* 85.2  --  82.2  --  86.4 87.5  --   PLT 206 143*  --  206  --  170 205  --    < > = values in this interval not displayed.     Basic Metabolic Panel: Recent Labs  Lab 04/15/2022 1231 04/18/22 0507 04/19/22 0627 04/20/22 0313 04/20/22 0446 04/21/22 0446  NA 137 138 135 139 138 139  K 3.1* 3.6 3.8 2.7* 3.5 4.1  CL 109 111 107 116* 110 116*  CO2 22 22 24  17* 19* 17*  GLUCOSE 102* 75 91 85 86 77  BUN 11 9 10 15 18  30*  CREATININE 0.80 0.70 0.83 1.07* 1.35* 1.58*  CALCIUM 7.3* 7.5* 7.4* 5.7* 7.4* 6.9*  MG 2.2 2.0  --   --   --  1.8     GFR: Estimated Creatinine Clearance: 13.9 mL/min (A) (by C-G formula based on SCr of 1.58 mg/dL (H)).  Liver Function Tests: Recent Labs  Lab 04/04/2022 1231 04/18/22 0507 04/19/22 1511 04/20/22 0313  AST 8* 7* 11* 17  ALT 6 6 6 8   ALKPHOS 54 51 67 38  BILITOT 0.4 1.2 0.6  0.6  PROT 5.5* 5.2* 6.0* 3.7*  ALBUMIN 2.9* 2.7* 3.1* 1.9*     Recent Labs  Lab 03/30/2022 1231 04/19/22 1511  LIPASE 40 39     Coagulation Profile: Recent Labs  Lab 04/02/2022 1231  INR 1.4*      Recent Results (from the past 240 hour(s))  MRSA Next Gen by PCR, Nasal     Status: Abnormal   Collection Time: 04/13/2022  9:27 PM   Specimen: Nasal Mucosa; Nasal Swab  Result Value Ref Range Status   MRSA by PCR Next Gen DETECTED (A) NOT DETECTED Final    Comment: RESULT CALLED TO, READ BACK BY AND VERIFIED WITH: EDGAR TINAJERO @ [id number] ON 04/18/22 C VARNER (NOTE) The GeneXpert MRSA Assay (FDA approved for NASAL specimens only), is one component of a comprehensive MRSA colonization surveillance program. It is not intended to diagnose MRSA infection nor to guide or monitor treatment for MRSA infections. Test performance is not FDA approved in patients less than 2 years old. Performed at [facility], [address] [place], [place] [address]   Culture, blood (Routine X 2) w Reflex to ID Panel     Status: None (Preliminary result)   Collection Time: 04/20/22 12:21 AM   Specimen: BLOOD LEFT HAND  Result Value Ref Range Status   Specimen Description   Final    BLOOD LEFT HAND Performed at [facility], [place] [address] [place], [place] [address]    Special Requests   Final    BOTTLES DRAWN AEROBIC AND ANAEROBIC Blood Culture results may not be optimal due to an inadequate volume of blood received in culture bottles Performed at [facility], [address], [place], [place] [address]    Culture  Setup Time   Final    AEROBIC BOTTLE ONLY GRAM POSITIVE COCCI Gram Stain Report Called to,Read Back By and Verified With: ROOS, G. @ [id number] 04/20/2022 BY FRATTO, A. CRITICAL RESULT CALLED TO, READ BACK BY AND VERIFIED WITH: RN Ralph Dowdy [phone] @ 2233  FH Performed at [facility], [place] [address], [place], [place] [address]    Culture GRAM POSITIVE COCCI  Final   Report Status  PENDING  Incomplete  Blood Culture ID Panel (Reflexed)     Status: Abnormal   Collection Time: 04/20/22 12:21 AM  Result Value Ref Range Status   Enterococcus faecalis NOT DETECTED NOT DETECTED Final   Enterococcus Faecium NOT DETECTED NOT DETECTED Final   Listeria monocytogenes NOT DETECTED NOT DETECTED Final   Staphylococcus species NOT DETECTED NOT DETECTED Final   Staphylococcus aureus (BCID) NOT DETECTED NOT DETECTED Final   Staphylococcus epidermidis NOT DETECTED NOT DETECTED Final   Staphylococcus lugdunensis NOT DETECTED NOT DETECTED Final   Streptococcus species DETECTED (A) NOT DETECTED Final    Comment: Not Enterococcus species, Streptococcus agalactiae, Streptococcus pyogenes, or Streptococcus pneumoniae. CRITICAL RESULT CALLED TO, READ BACK BY AND VERIFIED WITH: RN Ralph Dowdy [phone] @ 2233 FH    Streptococcus agalactiae NOT DETECTED NOT DETECTED Final   Streptococcus pneumoniae NOT DETECTED NOT DETECTED Final   Streptococcus pyogenes NOT DETECTED NOT DETECTED Final   A.calcoaceticus-baumannii NOT DETECTED NOT DETECTED Final   Bacteroides fragilis NOT DETECTED NOT DETECTED Final   Enterobacterales NOT DETECTED NOT DETECTED Final   Enterobacter cloacae complex NOT DETECTED NOT DETECTED Final   Escherichia coli NOT DETECTED NOT DETECTED Final   Klebsiella aerogenes NOT DETECTED NOT DETECTED Final   Klebsiella oxytoca NOT DETECTED NOT DETECTED Final   Klebsiella pneumoniae NOT DETECTED NOT DETECTED Final   Proteus species NOT DETECTED NOT DETECTED Final   Salmonella species NOT DETECTED NOT DETECTED Final   Serratia marcescens NOT DETECTED NOT DETECTED Final   Haemophilus influenzae NOT DETECTED NOT DETECTED Final   Neisseria meningitidis NOT DETECTED NOT DETECTED Final   Pseudomonas aeruginosa NOT DETECTED NOT DETECTED Final   Stenotrophomonas maltophilia NOT DETECTED NOT DETECTED Final   Candida albicans NOT DETECTED NOT DETECTED Final   Candida auris NOT DETECTED NOT  DETECTED Final   Candida glabrata NOT DETECTED NOT DETECTED Final   Candida krusei NOT DETECTED NOT DETECTED Final   Candida parapsilosis NOT DETECTED NOT DETECTED Final   Candida tropicalis NOT DETECTED NOT DETECTED Final   Cryptococcus neoformans/gattii NOT DETECTED NOT DETECTED Final    Comment: Performed at [facility], [place] [address], [place], [place] [address]  Culture, blood (Routine X 2) w Reflex to ID Panel     Status: None (Preliminary result)   Collection Time: 04/20/22 12:32 AM   Specimen: BLOOD  Result Value Ref Range Status   Specimen Description BLOOD BLOOD RIGHT HAND  Final   Special Requests   Final    BOTTLES DRAWN AEROBIC AND ANAEROBIC Blood Culture results may not be optimal due to an inadequate volume of blood received in culture bottles   Culture   Final    NO GROWTH < 24 HOURS Performed at [facility], [address] [place], [place] [address]    Report Status PENDING  Incomplete      Radiology Studies: DG Abd 1 View  Result Date: 04/20/2022 CLINICAL DATA:  Nasogastric tube placement. EXAM: ABDOMEN - 1 VIEW COMPARISON:  Same day. FINDINGS: Nasogastric tube is again noted to be coiled within the expected position of the hiatal hernia. Moderate amount of stool is noted in the right and transverse colon. No abnormal small bowel dilatation is noted. IMPRESSION: Nasogastric tube tip is noted to be coiled within expected position of hiatal hernia. Electronically Signed  By: Marijo Conception M.D.   On: 04/20/2022 08:36   DG Abd 1 View  Result Date: 04/20/2022 CLINICAL DATA:  Check gastric catheter placement EXAM: ABDOMEN - 1 VIEW COMPARISON:  04/19/2022 FINDINGS: Gastric catheter is noted coiled within a large hiatal hernia. Additionally a loop is noted in the distal esophagus. IMPRESSION: Coiling of the gastric catheter within the hiatal hernia. There is a loop identified in the mid esophagus as well. Slight withdrawal and reimaging by means of chest x-ray may be  helpful. Electronically Signed   By: Inez Catalina M.D.   On: 04/20/2022 01:37   CT CHEST ABDOMEN PELVIS W CONTRAST  Result Date: 04/19/2022 CLINICAL DATA:  Severe abdominal pain radiating to the chest. Hiatal hernia. Anemia and dementia. EXAM: CT CHEST, ABDOMEN, AND PELVIS WITH CONTRAST TECHNIQUE: Multidetector CT imaging of the chest, abdomen and pelvis was performed following the standard protocol during bolus administration of intravenous contrast. RADIATION DOSE REDUCTION: This exam was performed according to the departmental dose-optimization program which includes automated exposure control, adjustment of the mA and/or kV according to patient size and/or use of iterative reconstruction technique. CONTRAST:  70mL OMNIPAQUE IOHEXOL 300 MG/ML  SOLN COMPARISON:  CT abdomen and pelvis 04/18/2022 FINDINGS: CT CHEST FINDINGS Cardiovascular: Normal heart size. No pericardial effusions. Normal caliber thoracic aorta. No dissection. Calcification of the aorta and coronary arteries. Mediastinum/Nodes: Large esophageal hiatal hernia with nearly all of the stomach above the diaphragm. Fluid-filled nondistended esophagus likely due to reflux or dysmotility. No significant lymphadenopathy. Thyroid gland is unremarkable. Lungs/Pleura: Bilateral pleural effusions with basilar atelectasis or consolidation, greater on the left. Emphysematous changes. Motion artifact limits evaluation of the lungs but there appear to be patchy interstitial changes throughout the lungs, possibly indicating edema or pneumonia. No pneumothorax. Musculoskeletal: Increased thoracic kyphosis with multiple thoracic vertebral compression fractures. Lower thoracic compression fractures are not changed since the previous CT abdomen and pelvis. Upper thoracic compression are also likely chronic. Changes may represent osteoporosis. Degenerative changes in the spine and shoulders. CT ABDOMEN PELVIS FINDINGS Hepatobiliary: Surgical absence of the  gallbladder. Intra and extrahepatic bile duct dilatation is unchanged since prior study, likely physiologic. Calcification in the liver is likely postinflammatory. Pancreas: Diffuse pancreatic parenchymal atrophy likely representing chronic pancreatitis. No acute inflammatory changes. No focal mass or collection. Spleen: Normal in size without focal abnormality. Adrenals/Urinary Tract: No adrenal gland nodules. Nephrograms are symmetrical but appear somewhat delayed, possibly due to renal insufficiency. No hydronephrosis or hydroureter. Bilateral renal cysts. Largest is on the left measuring 7.5 cm diameter. No change since prior study. No imaging follow-up is indicated. Bladder is partially obscured by streak artifact from a left hip arthroplasty. No abnormalities demonstrated. Stomach/Bowel: Small bowel are not abnormally distended and mostly decompressed. Diffusely dilated colon with prominent stool throughout. Colonic dilatation is progressing since the prior study and probably represents progressive ileus. Colitis less likely. No wall thickening or focal lesions identified. Appendix is not identified. Vascular/Lymphatic: Calcification of the aorta. No aneurysm. No significant lymphadenopathy. Reproductive: Status post hysterectomy. No adnexal masses. Other: No free air or free fluid in the abdomen. Diffuse atrophy of the abdominal wall musculature. Musculoskeletal: Lumbar scoliosis convex towards the left. Multiple vertebral compression deformities are unchanged since prior study. Postoperative left hip arthroplasty. IMPRESSION: 1. Large esophageal hiatal hernia containing nearly all of the stomach. Fluid-filled nondilated esophagus likely due to reflux or dysmotility. 2. Emphysematous changes in the lungs with patchy interstitial changes possibly representing mild edema. Bilateral pleural effusions with basilar  atelectasis. 3. Bile duct dilatation is likely physiologic post cholecystectomy. 4. Diffuse  pancreatic atrophy likely due to chronic pancreatitis. No acute changes identified. 5. Aortic atherosclerosis. 6. Multiple vertebral compression deformities appear chronic and likely indicate osteoporosis. Electronically Signed   By: Lucienne Capers M.D.   On: 04/19/2022 22:15   DG Abd Portable 1V  Result Date: 04/19/2022 CLINICAL DATA:  Shortness of breath, abdominal pain EXAM: PORTABLE ABDOMEN - 1 VIEW COMPARISON:  Portable exam 1520 hours FINDINGS: Portable exam 1520 hours. Mildly increased stool throughout colon to rectum. Mild gaseous distention of stomach. Nondistended small bowel. No evidence of bowel obstruction or bowel wall thickening. Scattered atherosclerotic calcifications aorta and visceral vessels. Osseous demineralization with thoracolumbar scoliosis, LEFT hip prosthesis, and thoracolumbar compression fracture. IMPRESSION: Increased stool in colon. Electronically Signed   By: Lavonia Dana M.D.   On: 04/19/2022 15:41   DG CHEST PORT 1 VIEW  Result Date: 04/19/2022 CLINICAL DATA:  Shortness of breath EXAM: PORTABLE CHEST 1 VIEW COMPARISON:  Portable exam 1517 hours compared to 06/01/2021 and correlated with CT abdomen pelvis 04/10/2022 FINDINGS: Enlargement of cardiac silhouette. Large hiatal hernia with increased gas versus recent CT. Atherosclerotic calcification aorta and proximal great vessels with prominence of RIGHT paratracheal soft tissues unchanged likely related to tortuous innominate artery. LEFT lower lobe atelectasis versus consolidation and small LEFT pleural effusion. Chronic bronchitic and interstitial changes stable. No pneumothorax. Bones demineralized. IMPRESSION: Increased gaseous distension of large hiatal hernia versus CT exam of 03/25/2022. LEFT lower lobe atelectasis versus consolidation with small LEFT pleural effusion. Chronic bronchitic and chronic interstitial lung disease changes. Aortic Atherosclerosis (ICD10-I70.0). Electronically Signed   By: Lavonia Dana M.D.    On: 04/19/2022 15:34       LOS: 3 days   [facility] Pager on [email or web address]  04/21/2022, 9:38 AM

## 2022-04-21 NOTE — Progress Notes (Signed)
Sabrina Glass, M.D. Gastroenterology & Hepatology   Interval History:  No acute events overnight. Patient remained hypotensive in the low 60s to 70s overnight. No significant drainage from NG tube in the last 24 hours (60 cc).  Patient is a poor historian.  Per great-granddaughter's report, she was moaning intermittently and talked to her briefly but did not provide much information.  No melena, hematochezia, fever or chills.  Inpatient Medications:  Current Facility-Administered Medications:    0.9 %  sodium chloride infusion (Manually program via Guardrails IV Fluids), , Intravenous, Once, Zierle-Ghosh, Asia B, DO   0.9 %  sodium chloride infusion, , Intravenous, Continuous, Bonnielee Haff, MD, Last Rate: 100 mL/hr at 04/21/22 1001, New Bag at 04/21/22 1001   acetaminophen (TYLENOL) tablet 650 mg, 650 mg, Oral, Q6H PRN, 650 mg at 04/20/22 2104 **OR** acetaminophen (TYLENOL) suppository 650 mg, 650 mg, Rectal, Q6H PRN, Zierle-Ghosh, Asia B, DO, 650 mg at 04/19/22 2326   ALPRAZolam (XANAX) tablet 0.25 mg, 0.25 mg, Oral, Daily PRN, Zierle-Ghosh, Asia B, DO, 0.25 mg at 04/20/22 2104   amiodarone (NEXTERONE PREMIX) 360-4.14 MG/200ML-% (1.8 mg/mL) IV infusion, 60 mg/hr, Intravenous, Continuous, Zierle-Ghosh, Asia B, DO, Last Rate: 33.3 mL/hr at 04/21/22 0809, 60 mg/hr at 04/21/22 0809   amiodarone (NEXTERONE PREMIX) 360-4.14 MG/200ML-% (1.8 mg/mL) IV infusion, 30 mg/hr, Intravenous, Continuous, Zierle-Ghosh, Asia B, DO   cefTRIAXone (ROCEPHIN) 2 g in sodium chloride 0.9 % 100 mL IVPB, 2 g, Intravenous, Q24H, Zierle-Ghosh, Asia B, DO, Stopped at 04/21/22 0050   Chlorhexidine Gluconate Cloth 2 % PADS 6 each, 6 each, Topical, Q0600, Zierle-Ghosh, Asia B, DO, 6 each at 04/20/22 2007   [COMPLETED] cyanocobalamin (VITAMIN B12) injection 1,000 mcg, 1,000 mcg, Intramuscular, Daily, 1,000 mcg at 04/19/22 1019 **FOLLOWED BY** cyanocobalamin (VITAMIN B12) tablet 1,000 mcg, 1,000 mcg, Oral, Daily, Bonnielee Haff, MD   gabapentin (NEURONTIN) capsule 300 mg, 300 mg, Oral, TID, Zierle-Ghosh, Asia B, DO, 300 mg at 04/20/22 2105   metroNIDAZOLE (FLAGYL) IVPB 500 mg, 500 mg, Intravenous, Q8H, Zierle-Ghosh, Asia B, DO, Last Rate: 100 mL/hr at 04/21/22 1111, 500 mg at 04/21/22 1111   midodrine (PROAMATINE) tablet 5 mg, 5 mg, Oral, TID WC, Bonnielee Haff, MD   morphine (PF) 2 MG/ML injection 2 mg, 2 mg, Intravenous, Q2H PRN, Bonnielee Haff, MD, 2 mg at 04/19/22 1751   ondansetron (ZOFRAN) tablet 4 mg, 4 mg, Oral, Q6H PRN **OR** ondansetron (ZOFRAN) injection 4 mg, 4 mg, Intravenous, Q6H PRN, Zierle-Ghosh, Asia B, DO, 4 mg at 04/19/22 1456   oxyCODONE (Oxy IR/ROXICODONE) immediate release tablet 5 mg, 5 mg, Oral, Q4H PRN, Zierle-Ghosh, Asia B, DO, 5 mg at 04/20/22 1720   pantoprazole (PROTONIX) injection 40 mg, 40 mg, Intravenous, Q12H, Zierle-Ghosh, Asia B, DO, 40 mg at 04/20/22 2104   polyethylene glycol (MIRALAX / GLYCOLAX) packet 17 g, 17 g, Oral, Daily, Bonnielee Haff, MD   senna-docusate (Senokot-S) tablet 2 tablet, 2 tablet, Oral, BID, Bonnielee Haff, MD, 2 tablet at 04/20/22 2104   I/O    Intake/Output Summary (Last 24 hours) at 04/21/2022 1116 Last data filed at 04/21/2022 0556 Gross per 24 hour  Intake 2884.95 ml  Output 485 ml  Net 2399.95 ml     Physical Exam: Temp:  [97.7 F (36.5 C)-98.7 F (37.1 C)] 98.1 F (36.7 C) (03/30 0724) Pulse Rate:  [27-149] 27 (03/30 0630) Resp:  [16-43] 22 (03/30 0630) BP: (76-150)/(31-86) 77/39 (03/30 0600) SpO2:  [90 %-100 %] 90 % (03/30 0630) Weight:  [47.8 kg]  47.8 kg (03/30 0455)  Temp (24hrs), Avg:98.2 F (36.8 C), Min:97.7 F (36.5 C), Max:98.7 F (37.1 C) GENERAL: The patient is somnolent, O x3. Has NGT. HEENT: Head is normocephalic and atraumatic. EOMI are intact. Mouth is well hydrated and without lesions. NECK: Supple. No masses LUNGS: Clear to auscultation. No presence of rhonchi/wheezing/rales. Adequate chest expansion HEART: mildly  tachycardic, normal s1 and s2. ABDOMEN: tender diffusely without no guarding, no peritoneal signs, and nondistended. BS decreased. No masses. EXTREMITIES: Without any cyanosis, clubbing, rash, lesions or edema. NEUROLOGIC: barely arousable, Ox0, no focal motor deficit. SKIN: no jaundice, no rashes  Laboratory Data: CBC:     Component Value Date/Time   WBC 13.7 (H) 04/20/2022 0446   RBC 4.07 04/20/2022 0446   HGB 8.4 (L) 04/21/2022 0446   HCT 30.6 (L) 04/21/2022 0446   PLT 205 04/20/2022 0446   MCV 87.5 04/20/2022 0446   MCH 24.1 (L) 04/20/2022 0446   MCHC 27.5 (L) 04/20/2022 0446   RDW 24.6 (H) 04/20/2022 0446   LYMPHSABS 0.8 04/18/2022 0507   MONOABS 0.5 04/18/2022 0507   EOSABS 0.1 04/18/2022 0507   BASOSABS 0.1 04/18/2022 0507   COAG:  Lab Results  Component Value Date   INR 1.4 (H) 03/26/2022    BMP:     Latest Ref Rng & Units 04/21/2022    4:46 AM 04/20/2022    4:46 AM 04/20/2022    3:13 AM  BMP  Glucose 70 - 99 mg/dL 77  86  85   BUN 8 - 23 mg/dL 30  18  15    Creatinine 0.44 - 1.00 mg/dL 1.58  1.35  1.07   Sodium 135 - 145 mmol/L 139  138  139   Potassium 3.5 - 5.1 mmol/L 4.1  3.5  2.7   Chloride 98 - 111 mmol/L 116  110  116   CO2 22 - 32 mmol/L 17  19  17    Calcium 8.9 - 10.3 mg/dL 6.9  7.4  5.7     HEPATIC:     Latest Ref Rng & Units 04/20/2022    3:13 AM 04/19/2022    3:11 PM 04/18/2022    5:07 AM  Hepatic Function  Total Protein 6.5 - 8.1 g/dL 3.7  6.0  5.2   Albumin 3.5 - 5.0 g/dL 1.9  3.1  2.7   AST 15 - 41 U/L 17  11  7    ALT 0 - 44 U/L 8  6  6    Alk Phosphatase 38 - 126 U/L 38  67  51   Total Bilirubin 0.3 - 1.2 mg/dL 0.6  0.6  1.2   Bilirubin, Direct 0.0 - 0.2 mg/dL  0.1      CARDIAC:  Lab Results  Component Value Date   CKTOTAL 17 (L) 12/10/2021      Imaging: I personally reviewed and interpreted the available labs, imaging and endoscopic files.   Assessment/Plan: Sabrina Glass is a 89 y.o. female with history of advanced dementia,  hypertension, large hiatal hernia, arthritis, who was admitted to the hospital after being sent from her primary care provider due to the presence of severe anemia.  Gastroenterology was consulted after the patient presented severe abdominal pain.   Patient presented severe abdominal pain and persistent hypotension since admission.  Cross-sectional abdominal imaging showed presence of large hiatal hernia, no other acute abnormalities were found although there is presence of some colonic dilation.  I personally reviewed her CT scan images - there is possible  narrowing of the celiac artery and superior mesenteric artery but this is difficult to evaluate with the recurrent CT protocol.  Her current presentation, clinical findings, elevated lactic acid and hypotension are concerning for mesenteric ischemia, which may be better evaluated with a CT angio.  Nevertheless, as I discussed with the hospitalist, she has advanced age and dementia dementia, and is not clinically stable, for which it is reasonable to continue holding goals of care discussion.  Given her guarded prognosis, patient was changed to DNR and no aggressive interventions were requested by the family, focusing on supportive care.    It is also possible that she is presenting some nausea and vomiting due to the large hiatal hernia.  This could be further evaluated with an EGD, but since family is not pursuing aggressive interventions, we will hold off on this.  She will continue PPI twice daily while further discussion is held with family.  Patient has presence of anemia but no presence of overt gastrointestinal bleeding.   -Goals of care discussion with family, palliative care consult -PPI twice daily IV -Continue NG tube drainage intermittently -Check H&H every day - GI service will sign-off, please call us back if you have any more questions.   Sabrina Peppers, MD Gastroenterology and Hepatology [facility] Gastroenterology

## 2022-04-21 NOTE — Progress Notes (Signed)
Provider notified of bladder scan volume 229 ml, no urine output since I&O at 1915.  At this time, continue to monitor, pt not in distress and had one cath this shift.

## 2022-04-21 NOTE — TOC Initial Note (Signed)
Transition of Care [facility]) - Initial/Assessment Note    Patient Details  Name: Sabrina Glass MRN: [id number] Date of Birth: December 27, 1927  [facility]) CM/SW Contact:    Iona Beard, [place] Phone Number: 04/21/2022, 3:40 PM  Clinical Narrative:                 Pt is high risk for readmission. CSW spoke in pts room with great granddaughter. Pt lives with family. Pts great granddaughter is her caregiver. Pts family provides transportation. Pt has had HH in the past. Pt has a walker, BSC and hospital bed in the home. TOC to follow.   Expected Discharge Plan: [place] Barriers to Discharge: Continued Medical Work up   Patient Goals and CMS Choice Patient states their goals for this hospitalization and ongoing recovery are:: get better CMS Medicare.gov Compare Post Acute Care list provided to:: Patient Choice offered to / list presented to : Patient      Expected Discharge Plan and Services In-house Referral: Clinical Social Work Discharge Planning Services: CM Consult Post Acute Care Choice: Home Health Living arrangements for the past 2 months: Single Family Home Expected Discharge Date: 04/19/22                                    Prior Living Arrangements/Services Living arrangements for the past 2 months: Single Family Home Lives with:: Relatives, Adult Children Patient language and need for interpreter reviewed:: Yes Do you feel safe going back to the place where you live?: Yes      Need for Family Participation in Patient Care: Yes (Comment) Care giver support system in place?: Yes (comment) Current home services: DME Criminal Activity/Legal Involvement Pertinent to Current Situation/Hospitalization: No - Comment as needed  Activities of Daily Living Home Assistive Devices/Equipment: Environmental consultant (specify type), Bedside commode/3-in-1, Shower chair without back, Wheelchair ADL Screening (condition at time of admission) Patient's cognitive  ability adequate to safely complete daily activities?: No Is the patient deaf or have difficulty hearing?: No Does the patient have difficulty seeing, even when wearing glasses/contacts?: No Does the patient have difficulty concentrating, remembering, or making decisions?: Yes (pt has dementia) Patient able to express need for assistance with ADLs?: Yes Does the patient have difficulty dressing or bathing?: Yes Independently performs ADLs?: No Communication: Needs assistance Is this a change from baseline?: Pre-admission baseline Dressing (OT): Needs assistance Is this a change from baseline?: Pre-admission baseline Grooming: Needs assistance Is this a change from baseline?: Pre-admission baseline Feeding: Independent Is this a change from baseline?: Pre-admission baseline Bathing: Needs assistance Is this a change from baseline?: Pre-admission baseline Toileting: Needs assistance Is this a change from baseline?: Pre-admission baseline In/Out Bed: Needs assistance Is this a change from baseline?: Pre-admission baseline Walks in Home: Independent with device (comment) Does the patient have difficulty walking or climbing stairs?: Yes Weakness of Legs: None Weakness of Arms/Hands: None  Permission Sought/Granted                  Emotional Assessment Appearance:: Appears stated age       Alcohol / Substance Use: Not Applicable Psych Involvement: No (comment)  Admission diagnosis:  Hypokalemia [E87.6] Generalized abdominal pain [R10.84] Low hemoglobin [D64.9] Acute anemia [D64.9] Symptomatic anemia [D64.9] Patient Active Problem List   Diagnosis Date Noted   Generalized abdominal pain 04/21/2022   Symptomatic anemia 04/18/2022   Acute anemia 04/17/2022  Lactic acidosis 04/17/2022   Hypokalemia 04/17/2022   Anxiety 10/21/2021   Cellulitis 10/20/2021   GERD (gastroesophageal reflux disease) 05/29/2021   Arthritis 05/29/2021   At risk for falls 05/29/2021   History  of heart attack 05/29/2021   Chronic pain syndrome 05/29/2021   Pharmacologic therapy 05/29/2021   Disorder of skeletal system 05/29/2021   Problems influencing health status 05/29/2021   Age related osteoporosis 05/29/2021   Chronic hip pain (Left) 05/29/2021   T12 compression fracture, sequela 05/29/2021   Lumbar central spinal stenosis w/ neurogenic claudication (L3-4) 05/29/2021   Scoliosis of lumbar region due to degenerative disease of spine in adult 05/29/2021   DDD (degenerative disc disease), lumbosacral 05/29/2021   Levoscoliosis of lumbar spine 05/29/2021   Aortic atherosclerosis ([place]) 05/29/2021   Hiatal hernia 05/29/2021   Abnormal MRI, lumbar spine (07/06/2019) 05/29/2021   Lumbosacral facet arthropathy (Multilevel) (Bilateral) 05/29/2021   Lumbar lateral recess stenosis (Right: L3-4, L5-S1) (Left: L4-5) 05/29/2021   Lumbar nerve root impingement (L4) (Right) 05/29/2021   HTN (hypertension) 07/04/2019   Anemia 07/04/2019   Pressure injury of skin 07/04/2019   Pressure ulcer 07/04/2019   Closed hip fracture, sequela (Left) 07/03/2019   Fracture of femoral neck, sequela (Left) 07/03/2019   PCP:  System, Provider Not In Pharmacy:   [place], [address] [place] [address] Phone: [phone] Fax: [phone]     Social Determinants of Health (SDOH) Social History: [place]: No Food Insecurity (04/17/2022)  Housing: Low Risk  (04/17/2022)  Transportation Needs: No Transportation Needs (04/17/2022)  Utilities: Not At Risk (04/17/2022)  Tobacco Use: Low Risk  (04/17/2022)   SDOH Interventions:     Readmission Risk Interventions    04/21/2022    3:39 PM  Readmission Risk Prevention Plan  Transportation Screening Complete  Home Care Screening Complete  Medication Review (RN CM) Complete

## 2022-04-22 DIAGNOSIS — R1084 Generalized abdominal pain: Secondary | ICD-10-CM | POA: Diagnosis not present

## 2022-04-22 DIAGNOSIS — K449 Diaphragmatic hernia without obstruction or gangrene: Secondary | ICD-10-CM

## 2022-04-22 DIAGNOSIS — E872 Acidosis, unspecified: Secondary | ICD-10-CM | POA: Diagnosis not present

## 2022-04-22 DIAGNOSIS — D649 Anemia, unspecified: Secondary | ICD-10-CM | POA: Diagnosis not present

## 2022-04-22 LAB — GLUCOSE, CAPILLARY: Glucose-Capillary: 134 mg/dL — ABNORMAL HIGH (ref 70–99)

## 2022-04-22 LAB — COMPREHENSIVE METABOLIC PANEL
ALT: 9 U/L (ref 0–44)
AST: 16 U/L (ref 15–41)
Albumin: 1.8 g/dL — ABNORMAL LOW (ref 3.5–5.0)
Alkaline Phosphatase: 42 U/L (ref 38–126)
Anion gap: 7 (ref 5–15)
BUN: 41 mg/dL — ABNORMAL HIGH (ref 8–23)
CO2: 15 mmol/L — ABNORMAL LOW (ref 22–32)
Calcium: 6.7 mg/dL — ABNORMAL LOW (ref 8.9–10.3)
Chloride: 114 mmol/L — ABNORMAL HIGH (ref 98–111)
Creatinine, Ser: 1.69 mg/dL — ABNORMAL HIGH (ref 0.44–1.00)
GFR, Estimated: 28 mL/min — ABNORMAL LOW (ref >60)
Glucose, Bld: 66 mg/dL — ABNORMAL LOW (ref 70–99)
Potassium: 3.6 mmol/L (ref 3.5–5.1)
Sodium: 136 mmol/L (ref 135–145)
Total Bilirubin: 0.6 mg/dL (ref 0.3–1.2)
Total Protein: 3.8 g/dL — ABNORMAL LOW (ref 6.5–8.1)

## 2022-04-22 LAB — CBC
HCT: 27 % — ABNORMAL LOW (ref 36.0–46.0)
Hemoglobin: 7.3 g/dL — ABNORMAL LOW (ref 12.0–15.0)
MCH: 24.3 pg — ABNORMAL LOW (ref 26.0–34.0)
MCHC: 27 g/dL — ABNORMAL LOW (ref 30.0–36.0)
MCV: 89.7 fL (ref 80.0–100.0)
Platelets: 166 10*3/uL (ref 150–400)
RBC: 3.01 MIL/uL — ABNORMAL LOW (ref 3.87–5.11)
RDW: 27.1 % — ABNORMAL HIGH (ref 11.5–15.5)
WBC: 14 10*3/uL — ABNORMAL HIGH (ref 4.0–10.5)
nRBC: 0.4 % — ABNORMAL HIGH (ref 0.0–0.2)

## 2022-04-22 LAB — CULTURE, BLOOD (ROUTINE X 2)

## 2022-04-22 MED ORDER — SODIUM CHLORIDE 0.9 % IV BOLUS
250.0000 mL | Freq: Once | INTRAVENOUS | Status: AC
  Administered 2022-04-22: 250 mL via INTRAVENOUS

## 2022-04-22 MED ORDER — DEXTROSE 50 % IV SOLN
12.5000 g | INTRAVENOUS | Status: AC
  Administered 2022-04-22: 12.5 g via INTRAVENOUS
  Filled 2022-04-22: qty 50

## 2022-04-22 MED ORDER — SODIUM BICARBONATE 8.4 % IV SOLN
INTRAVENOUS | Status: DC
  Filled 2022-04-22 (×5): qty 1000

## 2022-04-22 MED ORDER — ALBUMIN HUMAN 25 % IV SOLN
25.0000 g | Freq: Once | INTRAVENOUS | Status: AC
  Administered 2022-04-22: 25 g via INTRAVENOUS
  Filled 2022-04-22: qty 100

## 2022-04-22 NOTE — Progress Notes (Signed)
Given decline in medical status and goal of care discussion (decision to not pursue any aggressive endoscopic procedures), will recommend conservative management with PPI BID and NGT intermittent suction. Continue GOC discussion for possible comfort care if no improvement.  GI service will sign-off, please call us back if you have any more questions.

## 2022-04-22 NOTE — Progress Notes (Signed)
Pt NGT connected back to LIS at start of shift after placement confirmed, no output returned.  Reviewed with provider, ok to pull NGT.  Removed at 2100, pt has had no N/V rest of shift.  Pt was able to swallow meds in applesauce, one at a time, sitting upright.  Pt also able to tolerated 120 ml water.  Pt HR currently in NSR, converted during noc.  Reviewed with provider, amiodarone stopped at this time.  Pt had c/o neck pain in evening, prn oxycodone given.  Pt has not been engaged overnight.  Breathing shallow, unlabored at this time.  Third spacing present on arms, ABD, perineum.  Pt had large soft/liquid BM this morning with seeping from rectum.  Urine tea colored, 325 ml output in foley.

## 2022-04-22 NOTE — Progress Notes (Signed)
[facility] PROGRESS NOTE   Chennel Rowin [id number] DOB: [birth year] DOA: 04/17/2022  PCP: System, Provider Not In  Brief History/Interval Summary: 89 y.o. female with medical history significant of arthritis, dementia, hypertension, who presented due to anemia.  It is reported that patient was in her normal state of health and went to a routine physical exam with her healthcare provider.  Lab work showed a drop in hemoglobin and patient was advised to come into the ER.     Consultants: None  Procedures: None    Subjective/Interval History: Overnight her NG tube was removed and amiodarone infusion was stopped.  Patient not very responsive this morning.  Lethargic.  No family at bedside currently.   Assessment/Plan:  Abdominal pain with nausea and vomiting/colonic ileus/hiatal hernia Initially there was concern for cardiac etiology but EKG was unremarkable for ischemia.  Her troponins were negative. Her symptoms appear to be primarily due to GI etiology.  She underwent imaging studies including plain x-rays as well as CT scan of the chest abdomen pelvis overnight.  There is concern for colonic ileus.  She has a large hiatal hernia.  No obvious small bowel obstruction noted.  NGT was placed due to persistent nausea. Symptoms could be due to her significant hiatal hernia. Lactic acidosis was noted raising concern for bowel ischemia.   She is an elderly patient with dementia.  She is not a candidate for aggressive interventions. Patient seen by gastroenterology.  Also discussed with general surgery. Prognosis is very guarded at this time.  She seems to be declining. Continue supportive care for now.  Continue with ceftriaxone and metronidazole.  Lactic acidosis has improved.  Atrial fibrillation with RVR Likely triggered by acute illness.  Started on amiodarone.  Heart rate has improved.  Amiodarone was discontinued overnight.  Continue to monitor on telemetry.  TSH was  normal back in September.  Considering her guarded prognosis advanced age and dementia we will not pursue any further testing at this time including echocardiogram.    Lactic acidosis Continue with IV hydration.  Seems to have resolved.  Acute kidney injury/normal anion gap metabolic acidosis Renal function continues to worsen despite IV fluids.  She also has developed metabolic acidosis. Will start bicarbonate infusion. ACE inhibitor was discontinued.     Hypotension Likely multifactorial including hypovolemia, possible sepsis.  Continue IV fluids for now. Started on midodrine although her oral intake has been poor.  Due to her lethargy and altered mental status medications have not been given.  Acute on chronic anemia/iron deficiency Patient presented with a hemoglobin of 4.3.  Review of previous records including Care Everywhere shows that patient was hospitalized in September at the [facility] in [place].  At that time she had a hemoglobin of 6.  She was seen by gastroenterology underwent endoscopy which showed hiatal hernia.  Apparently APC was performed at that time.  Patient's family member denies any use of NSAIDs.  No history of black-colored stool or dark-colored stools or blood in the stool.  No other bleeding episodes noted.  There was no clear indication to involve gastroenterology.  However now with abdominal symptoms as discussed above.   Patient transfused 3 units of PRBC.  Hemoglobin responded appropriately.  Hemoglobin has been stable.  No overt bleeding noted. Anemia panel showed severe iron deficiency.  She was given iron infusions.  Consider iron supplementation if she stabilizes.  Vitamin B12 deficiency Being supplemented.  Folic acid level is 8.6.  Hypokalemia Repleted.  Magnesium is 2.0.  Dyspnea She was dyspneic on 3/27 and was given furosemide x 1 as she had received multiple blood transfusions.    Chronic pain syndrome Continue  gabapentin.  History of GERD Continue Protonix  Essential hypertension Holding lisinopril.  Low blood pressures noted as discussed above.  Anxiety/history of dementia Continue Xanax.  Goals of care Patient continues to decline.  Prognosis is appears to be poor to guarded.  Patient was changed over to DNR.  If there is no clinical improvement in the next 24 hours will recommend transition to comfort care.  Will discuss with family if available later today.    DVT Prophylaxis: SCDs Code Status: Full code Family Communication: Discussed with her great granddaughter yesterday at bedside.  Left message for her daughter Freda Munro yesterday.  Will try to reach her again today. Disposition Plan: To be determined     Medications: Scheduled:  sodium chloride   Intravenous Once   Chlorhexidine Gluconate Cloth  6 each Topical Q0600   vitamin B-12  1,000 mcg Oral Daily   gabapentin  300 mg Oral TID   midodrine  5 mg Oral TID WC   pantoprazole (PROTONIX) IV  40 mg Intravenous Q12H   polyethylene glycol  17 g Oral Daily   senna-docusate  2 tablet Oral BID   Continuous:  amiodarone Stopped (04/22/22 0451)   cefTRIAXone (ROCEPHIN)  IV Stopped (04/22/22 0125)   metronidazole 500 mg (04/22/22 0911)   sodium bicarbonate 150 mEq in dextrose 5 % 1,150 mL infusion 100 mL/hr at 04/22/22 0903    [id number] **OR** acetaminophen, ALPRAZolam, morphine injection, ondansetron **OR** ondansetron (ZOFRAN) IV, oxyCODONE  Antibiotics: Anti-infectives (From admission, onward)    Start     Dose/Rate Route Frequency Ordered Stop   04/20/22 0200  metroNIDAZOLE (FLAGYL) IVPB 500 mg        500 mg 100 mL/hr over 60 Minutes Intravenous Every 8 hours 04/20/22 0003     04/20/22 0100  cefTRIAXone (ROCEPHIN) 2 g in sodium chloride 0.9 % 100 mL IVPB        2 g 200 mL/hr over 30 Minutes Intravenous Every 24 hours 04/20/22 0003     04/20/22 0045  Ampicillin-Sulbactam (UNASYN) 3 g in sodium chloride 0.9 % 100  mL IVPB  Status:  Discontinued        3 g 200 mL/hr over 30 Minutes Intravenous Every 8 hours 04/19/22 2351 04/20/22 0003       Objective:  Vital Signs  Vitals:   04/22/22 0620 04/22/22 0750 04/22/22 0800 04/22/22 0805  BP: (!) 84/24  (!) 84/27 (!) 82/30  Pulse: 77  78 77  Resp: 16  18 18   Temp:  98.5 F (36.9 C)    TempSrc:  Axillary    SpO2: 98%  97% 98%  Weight:      Height:        Intake/Output Summary (Last 24 hours) at 04/22/2022 0928 Last data filed at 04/22/2022 0839 Gross per 24 hour  Intake 3221.21 ml  Output 325 ml  Net 2896.21 ml    Filed Weights   04/18/22 0417 04/21/22 0455 04/22/22 0512  Weight: 42 kg 47.8 kg 48 kg   General appearance: Lethargic  Not responding to questions. Resp: Poor air entry at the bases.  Mildly tachypneic. Cardio: S1-S2 is normal regular.  No S3-S4.  No rubs murmurs or bruit GI: Remain tender in the abdomen.  Guarding.  No rebound or rigidity. Extremities: No edema.  Lab Results:  Data Reviewed: I have personally reviewed following labs and reports of the imaging studies  CBC: Recent Labs  Lab 04/17/22 1231 04/18/22 0507 04/18/22 1337 04/19/22 0627 04/20/22 0032 04/20/22 0313 04/20/22 0446 04/21/22 0446 04/22/22 0448  WBC 3.7* 3.8*  --  5.5  --  11.1* 13.7*  --  14.0*  NEUTROABS 2.4 2.4  --   --   --   --   --   --   --   HGB 4.3* 8.7*   < > 8.5* 10.6* 7.9* 9.8* 8.4* 7.3*  HCT 19.1* 30.6*   < > 28.7* 38.3 28.6* 35.6* 30.6* 27.0*  MCV 72.9* 85.2  --  82.2  --  86.4 87.5  --  89.7  PLT 206 143*  --  206  --  170 205  --  166   < > = values in this interval not displayed.     Basic Metabolic Panel: Recent Labs  Lab 04/17/22 1231 04/18/22 0507 04/19/22 0627 04/20/22 0313 04/20/22 0446 04/21/22 0446 04/22/22 0448  NA 137 138 135 139 138 139 136  K 3.1* 3.6 3.8 2.7* 3.5 4.1 3.6  CL 109 111 107 116* 110 116* 114*  CO2 22 22 24  17* 19* 17* 15*  GLUCOSE 102* 75 91 85 86 77 66*  BUN 11 9 10 15 18  30*  41*  CREATININE 0.80 0.70 0.83 1.07* 1.35* 1.58* 1.69*  CALCIUM 7.3* 7.5* 7.4* 5.7* 7.4* 6.9* 6.7*  MG 2.2 2.0  --   --   --  1.8  --      GFR: Estimated Creatinine Clearance: 13 mL/min (A) (by C-G formula based on SCr of 1.69 mg/dL (H)).  Liver Function Tests: Recent Labs  Lab 04/17/22 1231 04/18/22 0507 04/19/22 1511 04/20/22 0313 04/22/22 0448  AST 8* 7* 11* 17 16  ALT 6 6 6 8 9   ALKPHOS 54 51 67 38 42  BILITOT 0.4 1.2 0.6 0.6 0.6  PROT 5.5* 5.2* 6.0* 3.7* 3.8*  ALBUMIN 2.9* 2.7* 3.1* 1.9* 1.8*     Recent Labs  Lab 04/17/22 1231 04/19/22 1511  LIPASE 40 39     Coagulation Profile: Recent Labs  Lab 04/17/22 1231  INR 1.4*      Recent Results (from the past 240 hour(s))  MRSA Next Gen by PCR, Nasal     Status: Abnormal   Collection Time: 04/17/22  9:27 PM   Specimen: Nasal Mucosa; Nasal Swab  Result Value Ref Range Status   MRSA by PCR Next Gen DETECTED (A) NOT DETECTED Final    Comment: RESULT CALLED TO, READ BACK BY AND VERIFIED WITH: EDGAR TINAJERO @ [id number] ON 04/18/22 C VARNER (NOTE) The GeneXpert MRSA Assay (FDA approved for NASAL specimens only), is one component of a comprehensive MRSA colonization surveillance program. It is not intended to diagnose MRSA infection nor to guide or monitor treatment for MRSA infections. Test performance is not FDA approved in patients less than 23 years old. Performed at [facility], [address] [place], [place] [address]   Culture, blood (Routine X 2) w Reflex to ID Panel     Status: None (Preliminary result)   Collection Time: 04/20/22 12:21 AM   Specimen: BLOOD LEFT HAND  Result Value Ref Range Status   Specimen Description   Final    BLOOD LEFT HAND Performed at [facility], [place] [address] [place], [place] [address]    Special Requests   Final  BOTTLES DRAWN AEROBIC AND ANAEROBIC Blood Culture results may not be optimal due to an inadequate volume of blood received in culture  bottles Performed at [facility], [address], [place], [place] [address]    Culture  Setup Time   Final    AEROBIC BOTTLE ONLY GRAM POSITIVE COCCI Gram Stain Report Called to,Read Back By and Verified With: ROOS, G. @ [id number] 04/20/2022 BY FRATTO, A. CRITICAL RESULT CALLED TO, READ BACK BY AND VERIFIED WITH: RN Ralph Dowdy [phone] @ [address] [place]    Culture   Final    GRAM POSITIVE COCCI IDENTIFICATION TO FOLLOW Performed at [facility], [place] [address] [place], [place] [address]    Report Status PENDING  Incomplete  Blood Culture ID Panel (Reflexed)     Status: Abnormal   Collection Time: 04/20/22 12:21 AM  Result Value Ref Range Status   Enterococcus faecalis NOT DETECTED NOT DETECTED Final   Enterococcus Faecium NOT DETECTED NOT DETECTED Final   Listeria monocytogenes NOT DETECTED NOT DETECTED Final   Staphylococcus species NOT DETECTED NOT DETECTED Final   Staphylococcus aureus (BCID) NOT DETECTED NOT DETECTED Final   Staphylococcus epidermidis NOT DETECTED NOT DETECTED Final   Staphylococcus lugdunensis NOT DETECTED NOT DETECTED Final   Streptococcus species DETECTED (A) NOT DETECTED Final    Comment: Not Enterococcus species, Streptococcus agalactiae, Streptococcus pyogenes, or Streptococcus pneumoniae. CRITICAL RESULT CALLED TO, READ BACK BY AND VERIFIED WITH: RN Ralph Dowdy [phone] @ 2233 FH    Streptococcus agalactiae NOT DETECTED NOT DETECTED Final   Streptococcus pneumoniae NOT DETECTED NOT DETECTED Final   Streptococcus pyogenes NOT DETECTED NOT DETECTED Final   A.calcoaceticus-baumannii NOT DETECTED NOT DETECTED Final   Bacteroides fragilis NOT DETECTED NOT DETECTED Final   Enterobacterales NOT DETECTED NOT DETECTED Final   Enterobacter cloacae complex NOT DETECTED NOT DETECTED Final   Escherichia coli NOT DETECTED NOT DETECTED Final   Klebsiella aerogenes NOT DETECTED NOT DETECTED Final   Klebsiella oxytoca NOT DETECTED NOT DETECTED Final   Klebsiella pneumoniae  NOT DETECTED NOT DETECTED Final   Proteus species NOT DETECTED NOT DETECTED Final   Salmonella species NOT DETECTED NOT DETECTED Final   Serratia marcescens NOT DETECTED NOT DETECTED Final   Haemophilus influenzae NOT DETECTED NOT DETECTED Final   Neisseria meningitidis NOT DETECTED NOT DETECTED Final   Pseudomonas aeruginosa NOT DETECTED NOT DETECTED Final   Stenotrophomonas maltophilia NOT DETECTED NOT DETECTED Final   Candida albicans NOT DETECTED NOT DETECTED Final   Candida auris NOT DETECTED NOT DETECTED Final   Candida glabrata NOT DETECTED NOT DETECTED Final   Candida krusei NOT DETECTED NOT DETECTED Final   Candida parapsilosis NOT DETECTED NOT DETECTED Final   Candida tropicalis NOT DETECTED NOT DETECTED Final   Cryptococcus neoformans/gattii NOT DETECTED NOT DETECTED Final    Comment: Performed at [facility], [place] [address] [place],  [address]  Culture, blood (Routine X 2) w Reflex to ID Panel     Status: None (Preliminary result)   Collection Time: 04/20/22 12:32 AM   Specimen: BLOOD  Result Value Ref Range Status   Specimen Description BLOOD BLOOD RIGHT HAND  Final   Special Requests   Final    BOTTLES DRAWN AEROBIC AND ANAEROBIC Blood Culture results may not be optimal due to an inadequate volume of blood received in culture bottles   Culture   Final    NO GROWTH 2 DAYS Performed at [facility], [address] [place], [place]  [address]    Report Status PENDING  Incomplete      Radiology Studies: No results found.     LOS: 4 days   Zariana Strub [employer] on [email or web address]  04/22/2022, 9:28 AM

## 2022-04-23 ENCOUNTER — Encounter (HOSPITAL_COMMUNITY): Payer: Self-pay | Admitting: Internal Medicine

## 2022-04-23 DIAGNOSIS — E872 Acidosis, unspecified: Secondary | ICD-10-CM | POA: Diagnosis not present

## 2022-04-23 DIAGNOSIS — R1084 Generalized abdominal pain: Secondary | ICD-10-CM | POA: Diagnosis not present

## 2022-04-23 DIAGNOSIS — D649 Anemia, unspecified: Secondary | ICD-10-CM | POA: Diagnosis not present

## 2022-04-23 DIAGNOSIS — K449 Diaphragmatic hernia without obstruction or gangrene: Secondary | ICD-10-CM | POA: Diagnosis not present

## 2022-04-23 DIAGNOSIS — Z515 Encounter for palliative care: Secondary | ICD-10-CM | POA: Diagnosis not present

## 2022-04-23 LAB — COMPREHENSIVE METABOLIC PANEL
ALT: 10 U/L (ref 0–44)
AST: 23 U/L (ref 15–41)
Albumin: 2 g/dL — ABNORMAL LOW (ref 3.5–5.0)
Alkaline Phosphatase: 40 U/L (ref 38–126)
Anion gap: 11 (ref 5–15)
BUN: 42 mg/dL — ABNORMAL HIGH (ref 8–23)
CO2: 16 mmol/L — ABNORMAL LOW (ref 22–32)
Calcium: 6.8 mg/dL — ABNORMAL LOW (ref 8.9–10.3)
Chloride: 111 mmol/L (ref 98–111)
Creatinine, Ser: 1.75 mg/dL — ABNORMAL HIGH (ref 0.44–1.00)
GFR, Estimated: 27 mL/min — ABNORMAL LOW (ref >60)
Glucose, Bld: 105 mg/dL — ABNORMAL HIGH (ref 70–99)
Potassium: 3 mmol/L — ABNORMAL LOW (ref 3.5–5.1)
Sodium: 138 mmol/L (ref 135–145)
Total Bilirubin: 0.4 mg/dL (ref 0.3–1.2)
Total Protein: 4 g/dL — ABNORMAL LOW (ref 6.5–8.1)

## 2022-04-23 LAB — CBC
HCT: 23.8 % — ABNORMAL LOW (ref 36.0–46.0)
Hemoglobin: 7 g/dL — ABNORMAL LOW (ref 12.0–15.0)
MCH: 24.6 pg — ABNORMAL LOW (ref 26.0–34.0)
MCHC: 29.4 g/dL — ABNORMAL LOW (ref 30.0–36.0)
MCV: 83.8 fL (ref 80.0–100.0)
Platelets: 114 10*3/uL — ABNORMAL LOW (ref 150–400)
RBC: 2.84 MIL/uL — ABNORMAL LOW (ref 3.87–5.11)
RDW: 27.1 % — ABNORMAL HIGH (ref 11.5–15.5)
WBC: 10.1 10*3/uL (ref 4.0–10.5)
nRBC: 0 % (ref 0.0–0.2)

## 2022-04-23 MED ORDER — HALOPERIDOL LACTATE 5 MG/ML IJ SOLN: 0.5000 mg | INTRAMUSCULAR | Status: DC | PRN

## 2022-04-23 MED ORDER — GLYCOPYRROLATE 1 MG PO TABS: 1.0000 mg | ORAL_TABLET | ORAL | Status: DC | PRN

## 2022-04-23 MED ORDER — GLYCOPYRROLATE 0.2 MG/ML IJ SOLN: 0.2000 mg | INTRAMUSCULAR | Status: DC | PRN

## 2022-04-23 MED ORDER — BIOTENE DRY MOUTH MT LIQD: 15.0000 mL | OROMUCOSAL | Status: DC | PRN

## 2022-04-23 MED ORDER — POLYVINYL ALCOHOL 1.4 % OP SOLN: 1.0000 [drp] | Freq: Four times a day (QID) | OPHTHALMIC | Status: DC | PRN

## 2022-04-23 MED ORDER — HALOPERIDOL LACTATE 2 MG/ML PO CONC: 0.5000 mg | ORAL | Status: DC | PRN

## 2022-04-23 MED ORDER — MORPHINE SULFATE (PF) 2 MG/ML IV SOLN
2.0000 mg | INTRAVENOUS | Status: DC | PRN
  Administered 2022-04-23 – 2022-04-24 (×2): 2 mg via INTRAVENOUS
  Filled 2022-04-23 (×2): qty 1

## 2022-04-23 MED ORDER — HALOPERIDOL 0.5 MG PO TABS: 0.5000 mg | ORAL_TABLET | ORAL | Status: DC | PRN

## 2022-04-23 NOTE — Consult Note (Signed)
Consultation Note Date: 04/23/2022   Patient Name: Sabrina Glass  DOB: October 24, 1927  MRN: [id number]  Age / Sex: 89 y.o., female  PCP: System, Provider Not In Referring Physician: Bonnielee Haff, MD  Reason for Consultation: Establishing goals of care  HPI/Patient Profile: 89 y.o. female  with past medical history of dementia, HTN, OA, lives with family admitted on 04/17/2022 with abdominal pain with nausea and vomiting/colonic ileus/hiatal hernia, A-fib with RVR.   Clinical Assessment and Goals of Care: I have reviewed medical records including EPIC notes, labs and imaging, received report from RN, assessed the patient.  Sabrina Glass is lying quietly in bed.  She appears acutely/chronically ill.  She is resting comfortably, but does not wake when I gently call her name or touch her hand.  I do not believe that she can make her needs known.  There is no family at bedside at this time. Conference with bedside nursing staff related to patient condition, needs.  I return later in the afternoon to find great granddaughter, Sabrina Glass, at bedside.  She shares that she has been a caregiver for Sabrina Glass.  She shares that Sabrina Glass lives in her [place] home.  Her daughter Sabrina Glass is in jail in [place], [place].  Sabrina Glass shares that she is trying to facilitate a Geographical information systems officer for [name].  I share that I will ask chaplain service to help.  Sabrina Glass shares that she does not believe patient's son, Sabrina Glass, will be able to come from [place] as his health is poor.  Sabrina Glass shares that Sabrina Glass return home today to freshen up, but should be returning soon.  Call to daughter, Sabrina Glass,  to discuss diagnosis prognosis, [place], EOL wishes, disposition and options.  No answer, unable to leave voicemail message   Secure chat from attending who shares that Sabrina Glass has been made full comfort care.  Goals are set.  Orders  reviewed for needs.   Conference with attending, bedside nursing staff, transition of care team related to patient condition, needs, goals of care, disposition.   HCPOA NEXT OF KIN -daughter, Sabrina Glass    SUMMARY OF RECOMMENDATIONS   Comfort care, per attending In hospital death   Code Status/Advance Care Planning: DNR  Symptom Management:  End-of-life order set in place  Palliative Prophylaxis:  Frequent Pain Assessment, Oral Care, and Turn Reposition  Additional Recommendations (Limitations, Scope, Preferences): Full Comfort Care  Psycho-social/Spiritual:  Desire for further Chaplaincy support:no Additional Recommendations: Caregiving  Support/Resources and Grief/Bereavement Support  Prognosis:  Hours - Days  Discharge Planning: Anticipated Hospital Death      Primary Diagnoses: Present on Admission:  Acute anemia  HTN (hypertension)  Chronic pain syndrome  GERD (gastroesophageal reflux disease)  Anxiety  Symptomatic anemia   I have reviewed the medical record, interviewed the patient and family, and examined the patient. The following aspects are pertinent.  Past Medical History:  Diagnosis Date   Arthritis    Dementia    Headache    Hypertension    Pneumonia  Social History   Socioeconomic History   Marital status: Widowed    Spouse name: Not on file   Number of children: Not on file   Years of education: Not on file   Highest education level: Not on file  Occupational History   Not on file  Tobacco Use   Smoking status: Never   Smokeless tobacco: Never  Substance and Sexual Activity   Alcohol use: Never   Drug use: Never   Sexual activity: Not Currently  Other Topics Concern   Not on file  Social History Narrative   Not on file   Social Determinants of Health   Financial Resource Strain: Not on file  Food Insecurity: No Food Insecurity (04/17/2022)   Hunger Vital Sign    Worried About Running Out of Food in the Last Year:  Never true    Ran Out of Food in the Last Year: Never true  Transportation Needs: No Transportation Needs (04/17/2022)   PRAPARE - Hydrologist (Medical): No    Lack of Transportation (Non-Medical): No  Physical Activity: Not on file  Stress: Not on file  Social Connections: Not on file   Family History  Problem Relation Age of Onset   Diabetes Mellitus II Father    Scheduled Meds:  sodium chloride   Intravenous Once   Chlorhexidine Gluconate Cloth  6 each Topical Q0600   polyethylene glycol  17 g Oral Daily   senna-docusate  2 tablet Oral BID   Continuous Infusions: PRN Meds:.acetaminophen **OR** acetaminophen, morphine injection, ondansetron **OR** ondansetron (ZOFRAN) IV, oxyCODONE Medications Prior to Admission:  Prior to Admission medications   Medication Sig Start Date End Date Taking? Authorizing Provider  ALPRAZolam Duanne Moron) 0.25 MG tablet Take 0.25 mg by mouth daily as needed for anxiety. 04/16/22  Yes [provider]  ferrous sulfate 325 (65 FE) MG EC tablet Take 1 tablet (325 mg total) by mouth 2 (two) times daily. 04/19/22 08/17/22 Yes Sabrina Haff, MD  gabapentin (NEURONTIN) 300 MG capsule Take 300 mg by mouth 3 (three) times daily. 04/16/22  Yes [provider]  lisinopril (ZESTRIL) 10 MG tablet Take 10 mg by mouth daily. 04/16/22  Yes [provider]  polyethylene glycol (MIRALAX / GLYCOLAX) 17 g packet Take 17 g by mouth daily as needed (constipation).   Yes [provider]  cyanocobalamin (VITAMIN B12) 1000 MCG tablet Take 1 tablet (1,000 mcg total) by mouth daily. 04/19/22   Sabrina Haff, MD  HYDROcodone-acetaminophen (NORCO/VICODIN) 5-325 MG tablet Take 2 tablets by mouth every 4 (four) hours as needed. Patient not taking: Reported on 04/17/2022 12/10/21   Fredia Sorrow, MD  pantoprazole (PROTONIX) 40 MG tablet Take 1 tablet (40 mg total) by mouth 2 (two) times daily. 04/19/22   Sabrina Haff, MD   traMADol (ULTRAM) 50 MG tablet Take 50 mg by mouth 2 (two) times daily as needed for pain. Patient not taking: Reported on 04/17/2022 09/22/21   [provider]   Allergies  Allergen Reactions   Codeine Nausea And Vomiting, Swelling and Rash    Throat swelling (pt can take percocet)   Penicillins Nausea And Vomiting, Swelling and Rash    Throat swelling   Sulfa Antibiotics Nausea And Vomiting, Swelling and Rash    Throat swelling   Review of Systems  Unable to perform ROS: Acuity of condition    Physical Exam Vitals and nursing note reviewed.  Constitutional:      General: She is not  in acute distress.    Appearance: She is ill-appearing.  Pulmonary:     Effort: Pulmonary effort is normal. No respiratory distress.     Vital Signs: BP (!) 77/33   Pulse 81   Temp (!) 96.6 F (35.9 C) (Axillary)   Resp 16   Ht 4\' 7"  (1.397 m)   Wt 48 kg   SpO2 92%   BMI 24.60 kg/m  Pain Scale: PAINAD POSS *See Group Information*: 1-Acceptable,Awake and alert Pain Score: Asleep   SpO2: SpO2: 92 % O2 Device:SpO2: 92 % O2 Flow Rate: .O2 Flow Rate (L/min): 2 L/min (Per MD request)  IO: Intake/output summary:  Intake/Output Summary (Last 24 hours) at 04/23/2022 1244 Last data filed at 04/23/2022 1128 Gross per 24 hour  Intake 2409.96 ml  Output 910 ml  Net 1499.96 ml    LBM: Last BM Date : 04/22/22 Baseline Weight: Weight: 42.6 kg Most recent weight: Weight: 48 kg     Palliative Assessment/Data:     Time In: 0840  Time Out: 0955 Time Total: 75 minutse  Greater than 50%  of this time was spent counseling and coordinating care related to the above assessment and plan.  Signed by: Drue Novel, NP   Please contact Palliative Medicine Team phone at [phone] for questions and concerns.  For individual provider: See Shea Evans

## 2022-04-23 NOTE — Progress Notes (Signed)
Granddaughter at bedside informed attending RN that the daughter that is in jail is Sharyl Nimrod and that she is in the [facility], and that Horris Latino would really like to try and facetime her mother now that she has been made comfort care. Attending RN has reached out to hospital chaplain, and also called the [facility] and left a voicemail in hopes of trying to work out a conference. Will continue to try and make contact with [facility] and work with hospital Chaplain in order to find a solution.

## 2022-04-23 NOTE — Progress Notes (Addendum)
[facility] PROGRESS NOTE   Sabrina Glass [id number] DOB: September 28, 1927 DOA: 04/17/2022  PCP: System, Provider Not In  Brief History/Interval Summary: 89 y.o. female with medical history significant of arthritis, dementia, hypertension, who presented due to anemia.  It is reported that patient was in her normal state of health and went to a routine physical exam with her healthcare provider.  Lab work showed a drop in hemoglobin and patient was advised to come into the ER.     Consultants: None  Procedures: None    Subjective/Interval History: Patient is unresponsive this morning.  Great granddaughter is at the bedside.     Assessment/Plan:  Abdominal pain with nausea and vomiting/colonic ileus/hiatal hernia Initially there was concern for cardiac etiology but EKG was unremarkable for ischemia.  Her troponins were negative. Her symptoms appear to be primarily due to GI etiology.  She underwent imaging studies including plain x-rays as well as CT scan of the chest abdomen pelvis overnight.  There is concern for colonic ileus.  She has a large hiatal hernia.  No obvious small bowel obstruction noted.  NGT was placed due to persistent nausea. It is likely that she developed some acute process in her abdomen.  Symptoms could be due to her significant hiatal hernia. Lactic acidosis was noted raising concern for bowel ischemia.   She is an elderly patient with dementia.  She is not a candidate for aggressive interventions. Patient seen by gastroenterology.  Also discussed with general surgery. Patient appears to be declining.  She seems to be actively dying.  Atrial fibrillation with RVR Likely triggered by acute illness.  Started on amiodarone.  Heart rate has improved.  Amiodarone was discontinued.   TSH was normal back in September.  Considering her guarded prognosis advanced age and dementia we will not pursue any further testing at this time including echocardiogram.     Lactic acidosis Continue with IV hydration.  Seems to have resolved.  Acute kidney injury/normal anion gap metabolic acidosis Renal function continues to worsen despite IV fluids.  She also has developed metabolic acidosis.  Started on bicarbonate infusion.  Renal function continues to worsen.  ACE inhibitor was discontinued.  Hypokalemia This is in the setting of worsening renal function.  Waiting on family to get back to [place] regarding transition to comfort.  Will hold off on supplementing potassium for now.  Hypotension Likely multifactorial including hypovolemia, possible sepsis.  Was started on midodrine but she has been unresponsive and not able to take the medications.  Blood pressures remain very low.  As mentioned above she appears to be actively dying.    Acute on chronic anemia/iron deficiency Patient presented with a hemoglobin of 4.3.  Review of previous records including Care Everywhere shows that patient was hospitalized in September at the [facility] in [place].  At that time she had a hemoglobin of 6.  She was seen by gastroenterology underwent endoscopy which showed hiatal hernia.  Apparently APC was performed at that time.  Patient's family member denies any use of NSAIDs.  No history of black-colored stool or dark-colored stools or blood in the stool.  No other bleeding episodes noted.  There was no clear indication to involve gastroenterology.  However now with abdominal symptoms as discussed above.   Patient transfused 3 units of PRBC.  Hemoglobin responded appropriately.  No overt bleeding was noted. Anemia panel showed severe iron deficiency.  She was given iron infusions.  Consider iron supplementation if she  stabilizes. Hemoglobin trending down.  Vitamin B12 deficiency Being supplemented.  Folic acid level is 8.6.  Hypokalemia Repleted.  Magnesium is 2.0.  Dyspnea She was dyspneic on 3/27 and was given furosemide x 1 as she had received  multiple blood transfusions.    Chronic pain syndrome Continue gabapentin.  History of GERD Continue Protonix  Essential hypertension Holding lisinopril.  Low blood pressures noted as discussed above.  Anxiety/history of dementia Continue Xanax.  Goals of care Patient appears to be actively dying.  Conversations held with patient's daughter Sabrina Glass.  Patient was changed to DNR.  We have been waiting to see if patient's son will be able to come and visit her from [place].  Waiting to hear back from the daughter this morning.  Ideally patient should be transitioned to full comfort care.   ADDENDUM Discussed with patient's son Sabrina Glass who has multiple medical problems of his own.  He mentioned that he will not be able to come and see the patient.  Discussed with Sabrina Glass.  Okay with transition to comfort.  She requests trying to arrange a video call so that the other daughter can see the patient.  I have communicated this to the nursing staff.  Will transition to comfort care.  DVT Prophylaxis: SCDs Code Status: Full code Family Communication: Discussed with her great granddaughter at bedside.  Sabrina Glass to get back to me regarding transitioning to comfort.   Disposition Plan: Anticipate in-hospital death     Medications: Scheduled:  sodium chloride   Intravenous Once   Chlorhexidine Gluconate Cloth  6 each Topical Q0600   vitamin B-12  1,000 mcg Oral Daily   gabapentin  300 mg Oral TID   midodrine  5 mg Oral TID WC   pantoprazole (PROTONIX) IV  40 mg Intravenous Q12H   polyethylene glycol  17 g Oral Daily   senna-docusate  2 tablet Oral BID   Continuous:  amiodarone Stopped (04/22/22 0451)   cefTRIAXone (ROCEPHIN)  IV 2 g (04/23/22 0129)   metronidazole 500 mg (04/23/22 0817)   sodium bicarbonate 150 mEq in dextrose 5 % 1,150 mL infusion 100 mL/hr at 04/22/22 2145    [id number] **OR** acetaminophen, ALPRAZolam, morphine injection, ondansetron **OR**  ondansetron (ZOFRAN) IV, oxyCODONE  Antibiotics: Anti-infectives (From admission, onward)    Start     Dose/Rate Route Frequency Ordered Stop   04/20/22 0200  metroNIDAZOLE (FLAGYL) IVPB 500 mg        500 mg 100 mL/hr over 60 Minutes Intravenous Every 8 hours 04/20/22 0003     04/20/22 0100  cefTRIAXone (ROCEPHIN) 2 g in sodium chloride 0.9 % 100 mL IVPB        2 g 200 mL/hr over 30 Minutes Intravenous Every 24 hours 04/20/22 0003     04/20/22 0045  Ampicillin-Sulbactam (UNASYN) 3 g in sodium chloride 0.9 % 100 mL IVPB  Status:  Discontinued        3 g 200 mL/hr over 30 Minutes Intravenous Every 8 hours 04/19/22 2351 04/20/22 0003       Objective:  Vital Signs  Vitals:   04/23/22 0750 04/23/22 0800 04/23/22 0900 04/23/22 1000  BP:  (!) 91/40 (!) 81/39 (!) 80/33  Pulse:   92 91  Resp: 20 20 20 19   Temp:  (!) 96.6 F (35.9 C)    TempSrc:  Axillary    SpO2:   97% 99%  Weight:      Height:        Intake/Output  Summary (Last 24 hours) at 04/23/2022 1013 Last data filed at 04/23/2022 0912 Gross per 24 hour  Intake 2409.96 ml  Output 850 ml  Net 1559.96 ml    Filed Weights   04/18/22 0417 04/21/22 0455 04/22/22 0512  Weight: 42 kg 47.8 kg 48 kg    General appearance: Unresponsive Resp: Clear to auscultation bilaterally.  Normal effort Cardio: S1-S2 is normal regular.  No S3-S4.  No rubs murmurs or bruit GI: Abdomen is tender    Lab Results:  Data Reviewed: I have personally reviewed following labs and reports of the imaging studies  CBC: Recent Labs  Lab 04/17/22 1231 04/18/22 0507 04/18/22 1337 04/19/22 0627 04/20/22 0032 04/20/22 0313 04/20/22 0446 04/21/22 0446 04/22/22 0448 04/23/22 0310  WBC 3.7* 3.8*  --  5.5  --  11.1* 13.7*  --  14.0* 10.1  NEUTROABS 2.4 2.4  --   --   --   --   --   --   --   --   HGB 4.3* 8.7*   < > 8.5*   < > 7.9* 9.8* 8.4* 7.3* 7.0*  HCT 19.1* 30.6*   < > 28.7*   < > 28.6* 35.6* 30.6* 27.0* 23.8*  MCV 72.9* 85.2  --  82.2   --  86.4 87.5  --  89.7 83.8  PLT 206 143*  --  206  --  170 205  --  166 114*   < > = values in this interval not displayed.     Basic Metabolic Panel: Recent Labs  Lab 04/17/22 1231 04/18/22 0507 04/19/22 [id number] 04/20/22 0313 04/20/22 0446 04/21/22 0446 04/22/22 0448 04/23/22 0310  NA 137 138   < > 139 138 139 136 138  K 3.1* 3.6   < > 2.7* 3.5 4.1 3.6 3.0*  CL 109 111   < > 116* 110 116* 114* 111  CO2 22 22   < > 17* 19* 17* 15* 16*  GLUCOSE 102* 75   < > 85 86 77 66* 105*  BUN 11 9   < > 15 18 30* 41* 42*  CREATININE 0.80 0.70   < > 1.07* 1.35* 1.58* 1.69* 1.75*  CALCIUM 7.3* 7.5*   < > 5.7* 7.4* 6.9* 6.7* 6.8*  MG 2.2 2.0  --   --   --  1.8  --   --    < > = values in this interval not displayed.     GFR: Estimated Creatinine Clearance: 12.6 mL/min (A) (by C-G formula based on SCr of 1.75 mg/dL (H)).  Liver Function Tests: Recent Labs  Lab 04/18/22 0507 04/19/22 1511 04/20/22 0313 04/22/22 0448 04/23/22 0310  AST 7* 11* 17 16 23   ALT 6 6 8 9 10   ALKPHOS 51 67 38 42 40  BILITOT 1.2 0.6 0.6 0.6 0.4  PROT 5.2* 6.0* 3.7* 3.8* 4.0*  ALBUMIN 2.7* 3.1* 1.9* 1.8* 2.0*     Recent Labs  Lab 04/17/22 1231 04/19/22 1511  LIPASE 40 39     Coagulation Profile: Recent Labs  Lab 04/17/22 1231  INR 1.4*      Recent Results (from the past 240 hour(s))  MRSA Next Gen by PCR, Nasal     Status: Abnormal   Collection Time: 04/17/22  9:27 PM   Specimen: Nasal Mucosa; Nasal Swab  Result Value Ref Range Status   MRSA by PCR Next Gen DETECTED (A) NOT DETECTED Final    Comment: RESULT CALLED TO, READ BACK BY  AND VERIFIED WITH: Rulon Sera @ [id number] ON 04/18/22 C VARNER (NOTE) The GeneXpert MRSA Assay (FDA approved for NASAL specimens only), is one component of a comprehensive MRSA colonization surveillance program. It is not intended to diagnose MRSA infection nor to guide or monitor treatment for MRSA infections. Test performance is not FDA approved in patients  less than 85 years old. Performed at [facility], [address] [place], [place] [address]   Culture, blood (Routine X 2) w Reflex to ID Panel     Status: Abnormal   Collection Time: 04/20/22 12:21 AM   Specimen: BLOOD LEFT HAND  Result Value Ref Range Status   Specimen Description BLOOD LEFT HAND  Final   Special Requests   Final    BOTTLES DRAWN AEROBIC AND ANAEROBIC Blood Culture results may not be optimal due to an inadequate volume of blood received in culture bottles   Culture  Setup Time   Final    AEROBIC BOTTLE ONLY GRAM POSITIVE COCCI Gram Stain Report Called to,Read Back By and Verified With: ROOS, G. @ [id number] 04/20/2022 BY FRATTO, A. CRITICAL RESULT CALLED TO, READ BACK BY AND VERIFIED WITH: RN Ralph Dowdy [phone] @ 2233 FH    Culture (A)  Final    STREPTOCOCCUS GALLOLYTICUS THE SIGNIFICANCE OF ISOLATING THIS ORGANISM FROM A SINGLE SET OF BLOOD CULTURES WHEN MULTIPLE SETS ARE DRAWN IS UNCERTAIN. PLEASE NOTIFY THE MICROBIOLOGY DEPARTMENT WITHIN ONE WEEK IF SPECIATION AND SENSITIVITIES ARE REQUIRED.    Report Status 04/22/2022 FINAL  Final  Blood Culture ID Panel (Reflexed)     Status: Abnormal   Collection Time: 04/20/22 12:21 AM  Result Value Ref Range Status   Enterococcus faecalis NOT DETECTED NOT DETECTED Final   Enterococcus Faecium NOT DETECTED NOT DETECTED Final   Listeria monocytogenes NOT DETECTED NOT DETECTED Final   Staphylococcus species NOT DETECTED NOT DETECTED Final   Staphylococcus aureus (BCID) NOT DETECTED NOT DETECTED Final   Staphylococcus epidermidis NOT DETECTED NOT DETECTED Final   Staphylococcus lugdunensis NOT DETECTED NOT DETECTED Final   Streptococcus species DETECTED (A) NOT DETECTED Final    Comment: Not Enterococcus species, Streptococcus agalactiae, Streptococcus pyogenes, or Streptococcus pneumoniae. CRITICAL RESULT CALLED TO, READ BACK BY AND VERIFIED WITH: RN Ralph Dowdy [phone] @ 2233 FH    Streptococcus agalactiae NOT DETECTED NOT  DETECTED Final   Streptococcus pneumoniae NOT DETECTED NOT DETECTED Final   Streptococcus pyogenes NOT DETECTED NOT DETECTED Final   A.calcoaceticus-baumannii NOT DETECTED NOT DETECTED Final   Bacteroides fragilis NOT DETECTED NOT DETECTED Final   Enterobacterales NOT DETECTED NOT DETECTED Final   Enterobacter cloacae complex NOT DETECTED NOT DETECTED Final   Escherichia coli NOT DETECTED NOT DETECTED Final   Klebsiella aerogenes NOT DETECTED NOT DETECTED Final   Klebsiella oxytoca NOT DETECTED NOT DETECTED Final   Klebsiella pneumoniae NOT DETECTED NOT DETECTED Final   Proteus species NOT DETECTED NOT DETECTED Final   Salmonella species NOT DETECTED NOT DETECTED Final   Serratia marcescens NOT DETECTED NOT DETECTED Final   Haemophilus influenzae NOT DETECTED NOT DETECTED Final   Neisseria meningitidis NOT DETECTED NOT DETECTED Final   Pseudomonas aeruginosa NOT DETECTED NOT DETECTED Final   Stenotrophomonas maltophilia NOT DETECTED NOT DETECTED Final   Candida albicans NOT DETECTED NOT DETECTED Final   Candida auris NOT DETECTED NOT DETECTED Final   Candida glabrata NOT DETECTED NOT DETECTED Final   Candida krusei NOT DETECTED NOT DETECTED Final   Candida parapsilosis NOT DETECTED NOT DETECTED Final  Candida tropicalis NOT DETECTED NOT DETECTED Final   Cryptococcus neoformans/gattii NOT DETECTED NOT DETECTED Final    Comment: Performed at [facility], [place] [address], [place], [place] [address]  Culture, blood (Routine X 2) w Reflex to ID Panel     Status: None (Preliminary result)   Collection Time: 04/20/22 12:32 AM   Specimen: BLOOD  Result Value Ref Range Status   Specimen Description BLOOD BLOOD RIGHT HAND  Final   Special Requests   Final    BOTTLES DRAWN AEROBIC AND ANAEROBIC Blood Culture results may not be optimal due to an inadequate volume of blood received in culture bottles   Culture   Final    NO GROWTH 3 DAYS Performed at [facility], [address]  [place], [place] [address]    Report Status PENDING  Incomplete      Radiology Studies: No results found.     LOS: 5 days   Elea Holtzclaw [employer] on [email or web address]  04/23/2022, 10:13 AM

## 2022-04-23 NOTE — Progress Notes (Addendum)
Attempted to call jail administrator and administrator's assistant number's as well for possible contact, no answer, voicemail left.

## 2022-04-23 NOTE — Progress Notes (Signed)
E-link assisted with video chat with daughter around 1530.

## 2022-04-23 DEATH — deceased

## 2022-04-24 DIAGNOSIS — Z515 Encounter for palliative care: Secondary | ICD-10-CM | POA: Diagnosis not present

## 2022-04-24 DIAGNOSIS — E876 Hypokalemia: Secondary | ICD-10-CM | POA: Diagnosis not present

## 2022-04-24 DIAGNOSIS — R1084 Generalized abdominal pain: Secondary | ICD-10-CM | POA: Diagnosis not present

## 2022-04-24 DIAGNOSIS — D649 Anemia, unspecified: Secondary | ICD-10-CM | POA: Diagnosis not present

## 2022-04-24 MED ORDER — MORPHINE SULFATE (CONCENTRATE) 10 MG/0.5ML PO SOLN: 2.5000 mg | ORAL | Status: DC

## 2022-04-25 LAB — CULTURE, BLOOD (ROUTINE X 2): Culture: NO GROWTH

## 2022-04-25 NOTE — Progress Notes (Signed)
   05/04/22 May 11, 2227  Attending [place]  Attending Physician Notified Y  Attending Physician (First and Last Name) Legrand Pitts  Post Mortem Checklist  Date of Death 05/04/22  Time of Death 05-11-2226  Pronounced By Abbott Pao, [facility] LPN  Next of kin notified Yes  Name of next of kin notified of death Sharyl Nimrod  Contact Person's Relationship to Patient Daughter  Contact Person's Phone Number [phone]  Contact Person's address unknown  Was the patient a No Code Blue or a Limited Code Blue? No  Did the patient die unattended? Yes  Patient restrained? Not applicable  Height 4\' 7"  (1.397 m)  Weight 48 kg  [facility]previously known as [place]  Notification Date 04-May-2022  Notification Time 05-11-43  [facility] Number [phone] (Spoke with Sharol Given)  Is patient a potential donor? N  Autopsy  Autopsy requested by MD or Family ( Non ME Case) N/A  Patient and [place] Returned  Patient is satisfied that all belongings have been returned? Not applicable  Dermatherapy linen/gowns NOT sent with patient or transporter Not applicable  Dead on Arrival (Emergency Department)  Patient dead on arrival? No  Notifications  Patient Placement notified that Post Mortem checklist is complete Yes  Patient Placement notified body transferred Transported to [place]  Is this a medical examiner's case? [facility] home name/address/phone # [place] [phone]  Planned location of pickup Room

## 2022-05-23 NOTE — Progress Notes (Signed)
Report called and given to nursing staff on Dept. 300. Pt to be transported via bed to room 306.

## 2022-05-23 NOTE — Progress Notes (Signed)
Palliative: Mrs. Mcginity has transitioned to FULL COMFORT CARE.  She is lying quietly in bed.  She does not respond to gentle voice or touch.  Her great grand daughter is present.    Symptom management needs discussed with bedside nursing staff.  Conference with [facility] team.   Plan:  FULL COMFORT CARE.  End of life order set in place.  Scheduled morphine.   Prognosis:  hours to days, anticipate in hospital death.   25 minutes  Quinn Axe, NP Palliative Medicine Team  Team Phone [phone] Greater than 50% of this time was spent counseling and coordinating care related to the above assessment and plan.

## 2022-05-23 NOTE — Death Summary Note (Signed)
DEATH SUMMARY   Patient Details  Name: Sabrina Glass MRN: [id number] DOB: 1927/08/15 BTD:HRCBUL, Provider Not In Admission/Discharge Information   Admit Date:  2022/04/21  Date of Death: Date of Death: 2022-04-28  Time of Death: Time of Death: May 14, 2226  Length of Stay: 7   Principle Cause of death: Bowel ischemia  Brief History: 89 y.o. female with medical history significant of arthritis, dementia, hypertension, who presented due to anemia.  It is reported that patient was in her normal state of health and went to a routine physical exam with her healthcare provider.  Lab work showed a drop in hemoglobin and patient was advised to come into the ER.      Hospital Course:   Bowel ischemia/colonic ileus/hiatal hernia Her symptoms appear to be primarily due to GI etiology.  She underwent imaging studies including plain x-rays as well as CT scan of the chest abdomen pelvis overnight.  There is concern for colonic ileus.  She has a large hiatal hernia.  No obvious small bowel obstruction noted.  NGT was placed due to persistent nausea. It is likely that she developed some acute process in her abdomen.  Symptoms could be due to her significant hiatal hernia. Lactic acidosis was noted raising concern for bowel ischemia.   She is an elderly patient with dementia.  She was not a candidate for aggressive interventions. Patient seen by gastroenterology.  Also discussed with general surgery. Patient transitioned to comfort care due to active decline. She is subsequently expired on 04/28/2022 at 10:28 PM.   Atrial fibrillation with RVR Lactic acidosis Acute kidney injury/normal anion gap metabolic acidosis Hypokalemia Hypotension Acute on chronic anemia/iron deficiency Vitamin B12 deficiency Hypokalemia Chronic pain syndrome History of GERD Essential hypertension Anxiety/history of dementia Bacteremia   The results of significant diagnostics from this hospitalization (including imaging,  microbiology, ancillary and laboratory) are listed below for reference.   Significant Diagnostic Studies: DG Abd 1 View  Result Date: 04/20/2022 CLINICAL DATA:  Nasogastric tube placement. EXAM: ABDOMEN - 1 VIEW COMPARISON:  Same day. FINDINGS: Nasogastric tube is again noted to be coiled within the expected position of the hiatal hernia. Moderate amount of stool is noted in the right and transverse colon. No abnormal small bowel dilatation is noted. IMPRESSION: Nasogastric tube tip is noted to be coiled within expected position of hiatal hernia. Electronically Signed   By: Lupita Raider M.D.   On: 04/20/2022 08:36   DG Abd 1 View  Result Date: 04/20/2022 CLINICAL DATA:  Check gastric catheter placement EXAM: ABDOMEN - 1 VIEW COMPARISON:  04/19/2022 FINDINGS: Gastric catheter is noted coiled within a large hiatal hernia. Additionally a loop is noted in the distal esophagus. IMPRESSION: Coiling of the gastric catheter within the hiatal hernia. There is a loop identified in the mid esophagus as well. Slight withdrawal and reimaging by means of chest x-ray may be helpful. Electronically Signed   By: Alcide Clever M.D.   On: 04/20/2022 01:37   CT CHEST ABDOMEN PELVIS W CONTRAST  Result Date: 04/19/2022 CLINICAL DATA:  Severe abdominal pain radiating to the chest. Hiatal hernia. Anemia and dementia. EXAM: CT CHEST, ABDOMEN, AND PELVIS WITH CONTRAST TECHNIQUE: Multidetector CT imaging of the chest, abdomen and pelvis was performed following the standard protocol during bolus administration of intravenous contrast. RADIATION DOSE REDUCTION: This exam was performed according to the departmental dose-optimization program which includes automated exposure control, adjustment of the mA and/or kV according to patient size and/or use of iterative reconstruction  technique. CONTRAST:  75mL OMNIPAQUE IOHEXOL 300 MG/ML  SOLN COMPARISON:  CT abdomen and pelvis 04/06/2022 FINDINGS: CT CHEST FINDINGS Cardiovascular:  Normal heart size. No pericardial effusions. Normal caliber thoracic aorta. No dissection. Calcification of the aorta and coronary arteries. Mediastinum/Nodes: Large esophageal hiatal hernia with nearly all of the stomach above the diaphragm. Fluid-filled nondistended esophagus likely due to reflux or dysmotility. No significant lymphadenopathy. Thyroid gland is unremarkable. Lungs/Pleura: Bilateral pleural effusions with basilar atelectasis or consolidation, greater on the left. Emphysematous changes. Motion artifact limits evaluation of the lungs but there appear to be patchy interstitial changes throughout the lungs, possibly indicating edema or pneumonia. No pneumothorax. Musculoskeletal: Increased thoracic kyphosis with multiple thoracic vertebral compression fractures. Lower thoracic compression fractures are not changed since the previous CT abdomen and pelvis. Upper thoracic compression are also likely chronic. Changes may represent osteoporosis. Degenerative changes in the spine and shoulders. CT ABDOMEN PELVIS FINDINGS Hepatobiliary: Surgical absence of the gallbladder. Intra and extrahepatic bile duct dilatation is unchanged since prior study, likely physiologic. Calcification in the liver is likely postinflammatory. Pancreas: Diffuse pancreatic parenchymal atrophy likely representing chronic pancreatitis. No acute inflammatory changes. No focal mass or collection. Spleen: Normal in size without focal abnormality. Adrenals/Urinary Tract: No adrenal gland nodules. Nephrograms are symmetrical but appear somewhat delayed, possibly due to renal insufficiency. No hydronephrosis or hydroureter. Bilateral renal cysts. Largest is on the left measuring 7.5 cm diameter. No change since prior study. No imaging follow-up is indicated. Bladder is partially obscured by streak artifact from a left hip arthroplasty. No abnormalities demonstrated. Stomach/Bowel: Small bowel are not abnormally distended and mostly  decompressed. Diffusely dilated colon with prominent stool throughout. Colonic dilatation is progressing since the prior study and probably represents progressive ileus. Colitis less likely. No wall thickening or focal lesions identified. Appendix is not identified. Vascular/Lymphatic: Calcification of the aorta. No aneurysm. No significant lymphadenopathy. Reproductive: Status post hysterectomy. No adnexal masses. Other: No free air or free fluid in the abdomen. Diffuse atrophy of the abdominal wall musculature. Musculoskeletal: Lumbar scoliosis convex towards the left. Multiple vertebral compression deformities are unchanged since prior study. Postoperative left hip arthroplasty. IMPRESSION: 1. Large esophageal hiatal hernia containing nearly all of the stomach. Fluid-filled nondilated esophagus likely due to reflux or dysmotility. 2. Emphysematous changes in the lungs with patchy interstitial changes possibly representing mild edema. Bilateral pleural effusions with basilar atelectasis. 3. Bile duct dilatation is likely physiologic post cholecystectomy. 4. Diffuse pancreatic atrophy likely due to chronic pancreatitis. No acute changes identified. 5. Aortic atherosclerosis. 6. Multiple vertebral compression deformities appear chronic and likely indicate osteoporosis. Electronically Signed   By: Burman Nieves M.D.   On: 04/19/2022 22:15   DG Abd Portable 1V  Result Date: 04/19/2022 CLINICAL DATA:  Shortness of breath, abdominal pain EXAM: PORTABLE ABDOMEN - 1 VIEW COMPARISON:  Portable exam 1520 hours FINDINGS: Portable exam 1520 hours. Mildly increased stool throughout colon to rectum. Mild gaseous distention of stomach. Nondistended small bowel. No evidence of bowel obstruction or bowel wall thickening. Scattered atherosclerotic calcifications aorta and visceral vessels. Osseous demineralization with thoracolumbar scoliosis, LEFT hip prosthesis, and thoracolumbar compression fracture. IMPRESSION: Increased  stool in colon. Electronically Signed   By: Ulyses Southward M.D.   On: 04/19/2022 15:41   DG CHEST PORT 1 VIEW  Result Date: 04/19/2022 CLINICAL DATA:  Shortness of breath EXAM: PORTABLE CHEST 1 VIEW COMPARISON:  Portable exam 1517 hours compared to 06/01/2021 and correlated with CT abdomen pelvis 04/01/2022 FINDINGS: Enlargement of cardiac silhouette. Large  hiatal hernia with increased gas versus recent CT. Atherosclerotic calcification aorta and proximal great vessels with prominence of RIGHT paratracheal soft tissues unchanged likely related to tortuous innominate artery. LEFT lower lobe atelectasis versus consolidation and small LEFT pleural effusion. Chronic bronchitic and interstitial changes stable. No pneumothorax. Bones demineralized. IMPRESSION: Increased gaseous distension of large hiatal hernia versus CT exam of 04/04/2022. LEFT lower lobe atelectasis versus consolidation with small LEFT pleural effusion. Chronic bronchitic and chronic interstitial lung disease changes. Aortic Atherosclerosis (ICD10-I70.0). Electronically Signed   By: Ulyses Southward M.D.   On: 04/19/2022 15:34   CT ABDOMEN PELVIS W CONTRAST  Result Date: 04/19/2022 CLINICAL DATA:  Acute lower abdominal pain.  Anemia. EXAM: CT ABDOMEN AND PELVIS WITH CONTRAST TECHNIQUE: Multidetector CT imaging of the abdomen and pelvis was performed using the standard protocol following bolus administration of intravenous contrast. RADIATION DOSE REDUCTION: This exam was performed according to the departmental dose-optimization program which includes automated exposure control, adjustment of the mA and/or kV according to patient size and/or use of iterative reconstruction technique. CONTRAST:  75mL OMNIPAQUE IOHEXOL 300 MG/ML  SOLN COMPARISON:  None Available. FINDINGS: Lower chest: Small left pleural effusion is noted with minimal adjacent subsegmental atelectasis. Hepatobiliary: Status post cholecystectomy. Mild intrahepatic and extrahepatic biliary  dilatation is noted most consistent with post cholecystectomy status. Liver is unremarkable. Pancreas: Unremarkable. No pancreatic ductal dilatation or surrounding inflammatory changes. Spleen: Normal in size without focal abnormality. Adrenals/Urinary Tract: Adrenal glands appear normal. Bilateral renal cysts are noted for which no further follow-up is required. No hydronephrosis or renal obstruction is noted. Urinary bladder is unremarkable. Stomach/Bowel: Large sliding-type hiatal hernia is noted. There is no evidence of bowel obstruction or inflammation. Vascular/Lymphatic: Aortic atherosclerosis. No enlarged abdominal or pelvic lymph nodes. Reproductive: Uterus is not well visualized reflecting either prior hysterectomy or severe atrophy. No adnexal abnormality is noted. Other: No abdominal wall hernia or abnormality. No abdominopelvic ascites. Musculoskeletal: Status post left total hip arthroplasty. Old T12 fracture is noted. No acute osseous abnormality is noted. IMPRESSION: Small left pleural effusion with minimal adjacent subsegmental atelectasis. Large sliding-type hiatal hernia. No acute abnormality seen in the abdomen or pelvis. Aortic Atherosclerosis (ICD10-I70.0). Electronically Signed   By: Lupita Raider M.D.   On: 04/06/2022 17:03    Microbiology: No results found for this or any previous visit (from the past 240 hour(s)).     Signed: Osvaldo Shipper, MD

## 2022-05-23 NOTE — Progress Notes (Signed)
[facility] PROGRESS NOTE   Sabrina Glass [id number] DOB: [birth year] DOA: 04/17/2022  PCP: System, Provider Not In  Brief History/Interval Summary: 89 y.o. female with medical history significant of arthritis, dementia, hypertension, who presented due to anemia.  It is reported that patient was in her normal state of health and went to a routine physical exam with her healthcare provider.  Lab work showed a drop in hemoglobin and patient was advised to come into the ER.     Consultants: None  Procedures: None    Subjective/Interval History: Patient remains unresponsive.  Her family is at the bedside.     Assessment/Plan:  Abdominal pain with nausea and vomiting/colonic ileus/hiatal hernia Initially there was concern for cardiac etiology but EKG was unremarkable for ischemia.  Her troponins were negative. Her symptoms appear to be primarily due to GI etiology.  She underwent imaging studies including plain x-rays as well as CT scan of the chest abdomen pelvis overnight.  There is concern for colonic ileus.  She has a large hiatal hernia.  No obvious small bowel obstruction noted.  NGT was placed due to persistent nausea. It is likely that she developed some acute process in her abdomen.  Symptoms could be due to her significant hiatal hernia. Lactic acidosis was noted raising concern for bowel ischemia.   She is an elderly patient with dementia.  She was not a candidate for aggressive interventions. Patient seen by gastroenterology.  Also discussed with general surgery. Patient transition to comfort care due to active decline.  Atrial fibrillation with RVR Likely triggered by acute illness.  Started on amiodarone.  Heart rate improved.  Amiodarone was discontinued.   TSH was normal back in September.  Considering her guarded prognosis advanced age and dementia we will not pursue any further testing at this time including echocardiogram.    Lactic acidosis Continue with  IV hydration.  Seems to have resolved.  Acute kidney injury/normal anion gap metabolic acidosis Renal function continues to worsen despite IV fluids.  She also has developed metabolic acidosis.  Started on bicarbonate infusion.  Renal function continues to worsen.  ACE inhibitor was discontinued.  Hypokalemia  Hypotension Likely multifactorial including hypovolemia, possible sepsis.  Now comfort care.  Acute on chronic anemia/iron deficiency Patient presented with a hemoglobin of 4.3.  Review of previous records including Care Everywhere shows that patient was hospitalized in September at the [facility] in [place].  At that time she had a hemoglobin of 6.  She was seen by gastroenterology underwent endoscopy which showed hiatal hernia.  Apparently APC was performed at that time.  Patient's family member denies any use of NSAIDs.  No history of black-colored stool or dark-colored stools or blood in the stool.  No other bleeding episodes noted.  There was no clear indication to involve gastroenterology.  However now with abdominal symptoms as discussed above.   Patient transfused 3 units of PRBC.  Hemoglobin responded appropriately.  No overt bleeding was noted. Anemia panel showed severe iron deficiency.  She was given iron infusions.   Vitamin [id number] deficiency Folic acid level is 8.6.  Hypokalemia Repleted.  Magnesium is 2.0.  Chronic pain syndrome  History of GERD  Essential hypertension  Anxiety/history of dementia  Bacteremia Likely contaminant  Goals of care Patient remains obtunded.  Comfort care in progress.  Discussed with daughter who was at the bedside.  DVT Prophylaxis: Comfort care Code Status: DNR Family Communication: Discussed with daughter who was at the  bedside. Disposition Plan: Anticipate in-hospital death     Medications: Scheduled:  sodium chloride   Intravenous Once   Continuous:    [id number] **OR** acetaminophen,  antiseptic oral rinse, glycopyrrolate **OR** glycopyrrolate **OR** glycopyrrolate, haloperidol **OR** haloperidol **OR** haloperidol lactate, morphine injection, ondansetron **OR** ondansetron (ZOFRAN) IV, polyvinyl alcohol  Antibiotics: Anti-infectives (From admission, onward)    Start     Dose/Rate Route Frequency Ordered Stop   04/20/22 0200  metroNIDAZOLE (FLAGYL) IVPB 500 mg  Status:  Discontinued        500 mg 100 mL/hr over 60 Minutes Intravenous Every 8 hours 04/20/22 0003 04/23/22 1117   04/20/22 0100  cefTRIAXone (ROCEPHIN) 2 g in sodium chloride 0.9 % 100 mL IVPB  Status:  Discontinued        2 g 200 mL/hr over 30 Minutes Intravenous Every 24 hours 04/20/22 0003 04/23/22 1117   04/20/22 0045  Ampicillin-Sulbactam (UNASYN) 3 g in sodium chloride 0.9 % 100 mL IVPB  Status:  Discontinued        3 g 200 mL/hr over 30 Minutes Intravenous Every 8 hours 04/19/22 2351 04/20/22 0003       Objective:  Vital Signs  Vitals:    0500  0600  0700  0722  BP:      Pulse: 80 79 81 81  Resp: 18 17 17 16   Temp:      TempSrc:      SpO2: (!) 86% (!) 88% (!) 80% (!) 83%  Weight:      Height:        Intake/Output Summary (Last 24 hours) at  0923 Last data filed at 04/23/2022 1800 Gross per 24 hour  Intake 625.73 ml  Output 160 ml  Net 465.73 ml    Filed Weights   04/18/22 0417 04/21/22 0455 04/22/22 0512  Weight: 42 kg 47.8 kg 48 kg    Patient remains unresponsive.   Lab Results:  Data Reviewed: I have personally reviewed following labs and reports of the imaging studies  CBC: Recent Labs  Lab 04/17/22 1231 04/18/22 0507 04/18/22 1337 04/19/22 [id number] 04/20/22 0032 04/20/22 0313 04/20/22 0446 04/21/22 0446 04/22/22 0448 04/23/22 0310  WBC 3.7* 3.8*  --  5.5  --  11.1* 13.7*  --  14.0* 10.1  NEUTROABS 2.4 2.4  --   --   --   --   --   --   --   --   HGB 4.3* 8.7*   < > 8.5*   < > 7.9* 9.8* 8.4* 7.3* 7.0*  HCT 19.1* 30.6*   <  > 28.7*   < > 28.6* 35.6* 30.6* 27.0* 23.8*  MCV 72.9* 85.2  --  82.2  --  86.4 87.5  --  89.7 83.8  PLT 206 143*  --  206  --  170 205  --  166 114*   < > = values in this interval not displayed.     Basic Metabolic Panel: Recent Labs  Lab 04/17/22 1231 04/18/22 0507 04/19/22 [id number] 04/20/22 0313 04/20/22 0446 04/21/22 0446 04/22/22 0448 04/23/22 0310  NA 137 138   < > 139 138 139 136 138  K 3.1* 3.6   < > 2.7* 3.5 4.1 3.6 3.0*  CL 109 111   < > 116* 110 116* 114* 111  CO2 22 22   < > 17* 19* 17* 15* 16*  GLUCOSE 102* 75   < > 85 86 77 66* 105*  BUN 11 9   < >  15 18 30* 41* 42*  CREATININE 0.80 0.70   < > 1.07* 1.35* 1.58* 1.69* 1.75*  CALCIUM 7.3* 7.5*   < > 5.7* 7.4* 6.9* 6.7* 6.8*  MG 2.2 2.0  --   --   --  1.8  --   --    < > = values in this interval not displayed.     GFR: Estimated Creatinine Clearance: 12.6 mL/min (A) (by C-G formula based on SCr of 1.75 mg/dL (H)).  Liver Function Tests: Recent Labs  Lab 04/18/22 0507 04/19/22 1511 04/20/22 0313 04/22/22 0448 04/23/22 0310  AST 7* 11* 17 16 23   ALT 6 6 8 9 10   ALKPHOS 51 67 38 42 40  BILITOT 1.2 0.6 0.6 0.6 0.4  PROT 5.2* 6.0* 3.7* 3.8* 4.0*  ALBUMIN 2.7* 3.1* 1.9* 1.8* 2.0*     Recent Labs  Lab 04/17/22 1231 04/19/22 1511  LIPASE 40 39     Coagulation Profile: Recent Labs  Lab 04/17/22 1231  INR 1.4*      Recent Results (from the past 240 hour(s))  MRSA Next Gen by PCR, Nasal     Status: Abnormal   Collection Time: 04/17/22  9:27 PM   Specimen: Nasal Mucosa; Nasal Swab  Result Value Ref Range Status   MRSA by PCR Next Gen DETECTED (A) NOT DETECTED Final    Comment: RESULT CALLED TO, READ BACK BY AND VERIFIED WITH: EDGAR TINAJERO @ [id number] ON 04/18/22 C VARNER (NOTE) The GeneXpert MRSA Assay (FDA approved for NASAL specimens only), is one component of a comprehensive MRSA colonization surveillance program. It is not intended to diagnose MRSA infection nor to guide or monitor  treatment for MRSA infections. Test performance is not FDA approved in patients less than 21 years old. Performed at [facility], [address], [place], [place] [address]   Culture, blood (Routine X 2) w Reflex to ID Panel     Status: Abnormal   Collection Time: 04/20/22 12:21 AM   Specimen: BLOOD LEFT HAND  Result Value Ref Range Status   Specimen Description BLOOD LEFT HAND  Final   Special Requests   Final    BOTTLES DRAWN AEROBIC AND ANAEROBIC Blood Culture results may not be optimal due to an inadequate volume of blood received in culture bottles   Culture  Setup Time   Final    AEROBIC BOTTLE ONLY GRAM POSITIVE COCCI Gram Stain Report Called to,Read Back By and Verified With: ROOS, G. @ [id number] 04/20/2022 BY FRATTO, A. CRITICAL RESULT CALLED TO, READ BACK BY AND VERIFIED WITH: RN Ralph Dowdy [phone] @ 2233 FH    Culture (A)  Final    STREPTOCOCCUS GALLOLYTICUS THE SIGNIFICANCE OF ISOLATING THIS ORGANISM FROM A SINGLE SET OF BLOOD CULTURES WHEN MULTIPLE SETS ARE DRAWN IS UNCERTAIN. PLEASE NOTIFY THE MICROBIOLOGY DEPARTMENT WITHIN ONE WEEK IF SPECIATION AND SENSITIVITIES ARE REQUIRED.    Report Status 04/22/2022 FINAL  Final  Blood Culture ID Panel (Reflexed)     Status: Abnormal   Collection Time: 04/20/22 12:21 AM  Result Value Ref Range Status   Enterococcus faecalis NOT DETECTED NOT DETECTED Final   Enterococcus Faecium NOT DETECTED NOT DETECTED Final   Listeria monocytogenes NOT DETECTED NOT DETECTED Final   Staphylococcus species NOT DETECTED NOT DETECTED Final   Staphylococcus aureus (BCID) NOT DETECTED NOT DETECTED Final   Staphylococcus epidermidis NOT DETECTED NOT DETECTED Final   Staphylococcus lugdunensis NOT DETECTED NOT DETECTED Final   Streptococcus species DETECTED (A) NOT DETECTED  Final    Comment: Not Enterococcus species, Streptococcus agalactiae, Streptococcus pyogenes, or Streptococcus pneumoniae. CRITICAL RESULT CALLED TO, READ BACK BY AND VERIFIED WITH: RN  Ralph Dowdy [phone] @ 2233 FH    Streptococcus agalactiae NOT DETECTED NOT DETECTED Final   Streptococcus pneumoniae NOT DETECTED NOT DETECTED Final   Streptococcus pyogenes NOT DETECTED NOT DETECTED Final   A.calcoaceticus-baumannii NOT DETECTED NOT DETECTED Final   Bacteroides fragilis NOT DETECTED NOT DETECTED Final   Enterobacterales NOT DETECTED NOT DETECTED Final   Enterobacter cloacae complex NOT DETECTED NOT DETECTED Final   Escherichia coli NOT DETECTED NOT DETECTED Final   Klebsiella aerogenes NOT DETECTED NOT DETECTED Final   Klebsiella oxytoca NOT DETECTED NOT DETECTED Final   Klebsiella pneumoniae NOT DETECTED NOT DETECTED Final   Proteus species NOT DETECTED NOT DETECTED Final   Salmonella species NOT DETECTED NOT DETECTED Final   Serratia marcescens NOT DETECTED NOT DETECTED Final   Haemophilus influenzae NOT DETECTED NOT DETECTED Final   Neisseria meningitidis NOT DETECTED NOT DETECTED Final   Pseudomonas aeruginosa NOT DETECTED NOT DETECTED Final   Stenotrophomonas maltophilia NOT DETECTED NOT DETECTED Final   Candida albicans NOT DETECTED NOT DETECTED Final   Candida auris NOT DETECTED NOT DETECTED Final   Candida glabrata NOT DETECTED NOT DETECTED Final   Candida krusei NOT DETECTED NOT DETECTED Final   Candida parapsilosis NOT DETECTED NOT DETECTED Final   Candida tropicalis NOT DETECTED NOT DETECTED Final   Cryptococcus neoformans/gattii NOT DETECTED NOT DETECTED Final    Comment: Performed at [facility], [place] [address] [place], [place] [address]  Culture, blood (Routine X 2) w Reflex to ID Panel     Status: None (Preliminary result)   Collection Time: 04/20/22 12:32 AM   Specimen: BLOOD  Result Value Ref Range Status   Specimen Description BLOOD BLOOD RIGHT HAND  Final   Special Requests   Final    BOTTLES DRAWN AEROBIC AND ANAEROBIC Blood Culture results may not be optimal due to an inadequate volume of blood received in culture bottles   Culture    Final    NO GROWTH 3 DAYS Performed at [facility], [address], [place], [place] [address]    Report Status PENDING  Incomplete      Radiology Studies: No results found.     LOS: 6 days   Dannel Rafter [employer] on [email or web address]  , 9:23 AM

## 2022-05-23 DEATH — deceased
